# Patient Record
Sex: Female | Born: 1945 | Race: White | Hispanic: No | Smoking: Former smoker
Health system: Southern US, Community
[De-identification: ages and names within clinical notes are randomized; demographics above are authoritative.]

## PROBLEM LIST (undated history)

## (undated) DIAGNOSIS — M1990 Unspecified osteoarthritis, unspecified site: Secondary | ICD-10-CM

## (undated) DIAGNOSIS — Z973 Presence of spectacles and contact lenses: Secondary | ICD-10-CM

## (undated) DIAGNOSIS — G250 Essential tremor: Secondary | ICD-10-CM

## (undated) DIAGNOSIS — Z972 Presence of dental prosthetic device (complete) (partial): Secondary | ICD-10-CM

## (undated) DIAGNOSIS — E785 Hyperlipidemia, unspecified: Secondary | ICD-10-CM

## (undated) DIAGNOSIS — R112 Nausea with vomiting, unspecified: Secondary | ICD-10-CM

## (undated) DIAGNOSIS — Z9889 Other specified postprocedural states: Secondary | ICD-10-CM

## (undated) DIAGNOSIS — R7303 Prediabetes: Secondary | ICD-10-CM

## (undated) DIAGNOSIS — D071 Carcinoma in situ of vulva: Secondary | ICD-10-CM

## (undated) HISTORY — DX: Hyperlipidemia, unspecified: E78.5

## (undated) HISTORY — PX: KNEE ARTHROSCOPY: SUR90

## (undated) HISTORY — PX: TOTAL ABDOMINAL HYSTERECTOMY: SHX209

## (undated) HISTORY — PX: TOTAL HIP ARTHROPLASTY: SHX124

## (undated) HISTORY — PX: TOTAL KNEE ARTHROPLASTY: SHX125

---

## 1985-03-15 HISTORY — PX: OTHER SURGICAL HISTORY: SHX169

## 1999-02-02 ENCOUNTER — Encounter (INDEPENDENT_AMBULATORY_CARE_PROVIDER_SITE_OTHER): Payer: Self-pay

## 1999-02-02 ENCOUNTER — Encounter: Payer: Self-pay | Admitting: Orthopedic Surgery

## 1999-02-02 ENCOUNTER — Ambulatory Visit (HOSPITAL_COMMUNITY): Admission: RE | Admit: 1999-02-02 | Discharge: 1999-02-02 | Payer: Self-pay | Admitting: Orthopedic Surgery

## 1999-03-16 HISTORY — PX: WRIST GANGLION EXCISION: SUR520

## 2000-06-29 ENCOUNTER — Encounter: Admission: RE | Admit: 2000-06-29 | Discharge: 2000-06-29 | Payer: Self-pay | Admitting: Family Medicine

## 2000-06-29 ENCOUNTER — Encounter: Payer: Self-pay | Admitting: Family Medicine

## 2000-07-01 ENCOUNTER — Encounter: Admission: RE | Admit: 2000-07-01 | Discharge: 2000-07-01 | Payer: Self-pay | Admitting: Family Medicine

## 2000-07-01 ENCOUNTER — Encounter: Payer: Self-pay | Admitting: Family Medicine

## 2003-12-02 ENCOUNTER — Ambulatory Visit (HOSPITAL_COMMUNITY): Admission: RE | Admit: 2003-12-02 | Discharge: 2003-12-02 | Payer: Self-pay | Admitting: Orthopedic Surgery

## 2003-12-02 HISTORY — PX: PLANTAR FASCIA RELEASE: SHX2239

## 2005-01-19 ENCOUNTER — Ambulatory Visit: Payer: Self-pay | Admitting: Family Medicine

## 2005-02-08 ENCOUNTER — Ambulatory Visit: Payer: Self-pay | Admitting: Family Medicine

## 2005-04-05 ENCOUNTER — Ambulatory Visit: Payer: Self-pay | Admitting: Family Medicine

## 2005-06-14 ENCOUNTER — Ambulatory Visit: Payer: Self-pay | Admitting: Family Medicine

## 2005-06-21 ENCOUNTER — Ambulatory Visit: Payer: Self-pay | Admitting: Family Medicine

## 2005-07-19 ENCOUNTER — Ambulatory Visit: Payer: Self-pay | Admitting: Family Medicine

## 2005-07-27 ENCOUNTER — Ambulatory Visit: Payer: Self-pay | Admitting: Family Medicine

## 2005-09-13 ENCOUNTER — Ambulatory Visit: Payer: Self-pay | Admitting: Family Medicine

## 2005-10-11 ENCOUNTER — Ambulatory Visit: Payer: Self-pay | Admitting: Family Medicine

## 2005-12-21 DIAGNOSIS — IMO0002 Reserved for concepts with insufficient information to code with codable children: Secondary | ICD-10-CM | POA: Insufficient documentation

## 2005-12-21 DIAGNOSIS — E785 Hyperlipidemia, unspecified: Secondary | ICD-10-CM | POA: Insufficient documentation

## 2005-12-21 DIAGNOSIS — N951 Menopausal and female climacteric states: Secondary | ICD-10-CM | POA: Insufficient documentation

## 2005-12-21 DIAGNOSIS — E669 Obesity, unspecified: Secondary | ICD-10-CM | POA: Insufficient documentation

## 2005-12-21 DIAGNOSIS — H9319 Tinnitus, unspecified ear: Secondary | ICD-10-CM | POA: Insufficient documentation

## 2005-12-21 DIAGNOSIS — M1710 Unilateral primary osteoarthritis, unspecified knee: Secondary | ICD-10-CM

## 2005-12-21 DIAGNOSIS — M722 Plantar fascial fibromatosis: Secondary | ICD-10-CM | POA: Insufficient documentation

## 2005-12-21 DIAGNOSIS — N3941 Urge incontinence: Secondary | ICD-10-CM | POA: Insufficient documentation

## 2005-12-27 ENCOUNTER — Ambulatory Visit: Payer: Self-pay | Admitting: Family Medicine

## 2006-01-24 ENCOUNTER — Ambulatory Visit: Payer: Self-pay | Admitting: Family Medicine

## 2006-02-14 ENCOUNTER — Ambulatory Visit: Payer: Self-pay | Admitting: Family Medicine

## 2006-02-14 LAB — CONVERTED CEMR LAB: Blood Glucose, Fasting: 106 mg/dL

## 2006-02-15 LAB — CONVERTED CEMR LAB
Cholesterol: 162 mg/dL (ref 0–200)
HDL: 43 mg/dL (ref >39)
LDL Cholesterol: 75 mg/dL (ref 0–99)
Total CHOL/HDL Ratio: 3.8
Triglycerides: 220 mg/dL — ABNORMAL HIGH (ref <150)
VLDL: 44 mg/dL — ABNORMAL HIGH (ref 0–40)

## 2006-02-21 ENCOUNTER — Telehealth (INDEPENDENT_AMBULATORY_CARE_PROVIDER_SITE_OTHER): Payer: Self-pay | Admitting: *Deleted

## 2006-02-21 ENCOUNTER — Telehealth: Payer: Self-pay | Admitting: Family Medicine

## 2006-05-16 ENCOUNTER — Ambulatory Visit: Payer: Self-pay | Admitting: Family Medicine

## 2006-05-16 LAB — CONVERTED CEMR LAB: Blood Glucose, Fasting: 111 mg/dL

## 2006-05-17 LAB — CONVERTED CEMR LAB
BUN: 24 mg/dL — ABNORMAL HIGH (ref 6–23)
CO2: 24 meq/L (ref 19–32)
Calcium: 9.4 mg/dL (ref 8.4–10.5)
Chloride: 106 meq/L (ref 96–112)
Creatinine, Ser: 0.78 mg/dL (ref 0.40–1.20)
Glucose, Bld: 94 mg/dL (ref 70–99)
Potassium: 4.2 meq/L (ref 3.5–5.3)
Sodium: 143 meq/L (ref 135–145)

## 2006-06-30 ENCOUNTER — Encounter: Payer: Self-pay | Admitting: Family Medicine

## 2006-06-30 ENCOUNTER — Ambulatory Visit: Payer: Self-pay | Admitting: Family Medicine

## 2006-09-12 ENCOUNTER — Ambulatory Visit: Payer: Self-pay | Admitting: Family Medicine

## 2006-09-12 DIAGNOSIS — E119 Type 2 diabetes mellitus without complications: Secondary | ICD-10-CM

## 2006-09-12 DIAGNOSIS — E1169 Type 2 diabetes mellitus with other specified complication: Secondary | ICD-10-CM | POA: Insufficient documentation

## 2006-09-12 LAB — CONVERTED CEMR LAB
Blood Glucose, Fasting: 118 mg/dL
Hgb A1c MFr Bld: 5.8 %

## 2006-11-15 ENCOUNTER — Encounter: Payer: Self-pay | Admitting: Family Medicine

## 2006-11-15 LAB — CONVERTED CEMR LAB
BUN: 23 mg/dL (ref 6–23)
CO2: 19 meq/L (ref 19–32)
Calcium: 9.3 mg/dL (ref 8.4–10.5)
Chloride: 108 meq/L (ref 96–112)
Creatinine, Ser: 0.71 mg/dL (ref 0.40–1.20)
Glucose, Bld: 116 mg/dL — ABNORMAL HIGH (ref 70–99)
Potassium: 4.1 meq/L (ref 3.5–5.3)
Sodium: 142 meq/L (ref 135–145)

## 2006-11-16 ENCOUNTER — Telehealth (INDEPENDENT_AMBULATORY_CARE_PROVIDER_SITE_OTHER): Payer: Self-pay | Admitting: *Deleted

## 2006-11-24 ENCOUNTER — Encounter: Payer: Self-pay | Admitting: Family Medicine

## 2007-01-31 ENCOUNTER — Ambulatory Visit: Payer: Self-pay | Admitting: Family Medicine

## 2007-04-24 ENCOUNTER — Ambulatory Visit: Payer: Self-pay | Admitting: Family Medicine

## 2007-04-24 LAB — CONVERTED CEMR LAB
ALT: 16 units/L (ref 0–35)
AST: 18 units/L (ref 0–37)
Albumin: 4.6 g/dL (ref 3.5–5.2)
Alkaline Phosphatase: 79 units/L (ref 39–117)
BUN: 29 mg/dL — ABNORMAL HIGH (ref 6–23)
Blood Glucose, Fasting: 120 mg/dL
CO2: 25 meq/L (ref 19–32)
Calcium: 9.6 mg/dL (ref 8.4–10.5)
Chloride: 104 meq/L (ref 96–112)
Cholesterol: 187 mg/dL (ref 0–200)
Creatinine, Ser: 0.81 mg/dL (ref 0.40–1.20)
Glucose, Bld: 120 mg/dL — ABNORMAL HIGH (ref 70–99)
HDL: 48 mg/dL (ref >39)
Hgb A1c MFr Bld: 6 %
LDL Cholesterol: 108 mg/dL — ABNORMAL HIGH (ref 0–99)
Potassium: 4.6 meq/L (ref 3.5–5.3)
Sodium: 140 meq/L (ref 135–145)
TSH: 1.345 microintl units/mL (ref 0.350–5.50)
Total Bilirubin: 0.6 mg/dL (ref 0.3–1.2)
Total CHOL/HDL Ratio: 3.9
Total Protein: 7.6 g/dL (ref 6.0–8.3)
Triglycerides: 154 mg/dL — ABNORMAL HIGH (ref <150)
VLDL: 31 mg/dL (ref 0–40)

## 2007-04-25 ENCOUNTER — Encounter: Payer: Self-pay | Admitting: Family Medicine

## 2007-07-26 ENCOUNTER — Ambulatory Visit: Payer: Self-pay | Admitting: Family Medicine

## 2007-08-25 ENCOUNTER — Ambulatory Visit: Payer: Self-pay | Admitting: Family Medicine

## 2007-10-24 ENCOUNTER — Ambulatory Visit: Payer: Self-pay | Admitting: Family Medicine

## 2007-10-24 LAB — CONVERTED CEMR LAB
Blood Glucose, Fasting: 123 mg/dL
Cholesterol, target level: 200 mg/dL
HDL goal, serum: 40 mg/dL
Hgb A1c MFr Bld: 6.3 %
LDL Goal: 160 mg/dL

## 2007-10-25 LAB — CONVERTED CEMR LAB: Direct LDL: 112 mg/dL — ABNORMAL HIGH

## 2008-03-11 LAB — HM MAMMOGRAPHY

## 2008-03-18 ENCOUNTER — Encounter: Payer: Self-pay | Admitting: Family Medicine

## 2008-05-06 ENCOUNTER — Ambulatory Visit: Payer: Self-pay | Admitting: Family Medicine

## 2008-05-06 ENCOUNTER — Encounter: Payer: Self-pay | Admitting: Family Medicine

## 2008-05-06 LAB — CONVERTED CEMR LAB: Blood Glucose, Fasting: 122 mg/dL

## 2008-05-07 LAB — CONVERTED CEMR LAB
ALT: 15 units/L (ref 0–35)
AST: 14 units/L (ref 0–37)
Albumin: 4.6 g/dL (ref 3.5–5.2)
Alkaline Phosphatase: 77 units/L (ref 39–117)
BUN: 27 mg/dL — ABNORMAL HIGH (ref 6–23)
CO2: 24 meq/L (ref 19–32)
Calcium: 9.3 mg/dL (ref 8.4–10.5)
Chloride: 107 meq/L (ref 96–112)
Cholesterol: 177 mg/dL (ref 0–200)
Creatinine, Ser: 0.83 mg/dL (ref 0.40–1.20)
Glucose, Bld: 122 mg/dL — ABNORMAL HIGH (ref 70–99)
HDL: 44 mg/dL (ref >39)
LDL Cholesterol: 84 mg/dL (ref 0–99)
Potassium: 4.2 meq/L (ref 3.5–5.3)
Sodium: 143 meq/L (ref 135–145)
Total Bilirubin: 0.5 mg/dL (ref 0.3–1.2)
Total CHOL/HDL Ratio: 4
Total Protein: 7.4 g/dL (ref 6.0–8.3)
Triglycerides: 245 mg/dL — ABNORMAL HIGH (ref <150)
VLDL: 49 mg/dL — ABNORMAL HIGH (ref 0–40)

## 2008-07-23 ENCOUNTER — Ambulatory Visit: Payer: Self-pay | Admitting: Family Medicine

## 2008-07-23 DIAGNOSIS — L723 Sebaceous cyst: Secondary | ICD-10-CM | POA: Insufficient documentation

## 2008-07-24 ENCOUNTER — Encounter: Payer: Self-pay | Admitting: Family Medicine

## 2008-09-02 ENCOUNTER — Ambulatory Visit: Payer: Self-pay | Admitting: Family Medicine

## 2008-09-02 LAB — CONVERTED CEMR LAB: Blood Glucose, Fasting: 118 mg/dL

## 2008-09-03 ENCOUNTER — Telehealth: Payer: Self-pay | Admitting: Family Medicine

## 2008-09-03 LAB — CONVERTED CEMR LAB: Triglycerides: 236 mg/dL — ABNORMAL HIGH (ref <150)

## 2008-12-23 ENCOUNTER — Encounter: Payer: Self-pay | Admitting: Family Medicine

## 2009-04-03 ENCOUNTER — Ambulatory Visit: Payer: Self-pay | Admitting: Family Medicine

## 2009-04-03 LAB — CONVERTED CEMR LAB
Blood Glucose, AC Bkfst: 125 mg/dL
Hgb A1c MFr Bld: 6.2 %

## 2009-04-04 LAB — CONVERTED CEMR LAB
ALT: 18 units/L (ref 0–35)
AST: 17 units/L (ref 0–37)
Albumin: 4.9 g/dL (ref 3.5–5.2)
Alkaline Phosphatase: 81 units/L (ref 39–117)
BUN: 25 mg/dL — ABNORMAL HIGH (ref 6–23)
CO2: 24 meq/L (ref 19–32)
Calcium: 10.1 mg/dL (ref 8.4–10.5)
Chloride: 103 meq/L (ref 96–112)
Cholesterol: 149 mg/dL (ref 0–200)
Creatinine, Ser: 0.67 mg/dL (ref 0.40–1.20)
Glucose, Bld: 111 mg/dL — ABNORMAL HIGH (ref 70–99)
HDL: 46 mg/dL (ref >39)
LDL Cholesterol: 76 mg/dL (ref 0–99)
Potassium: 4.3 meq/L (ref 3.5–5.3)
Sodium: 143 meq/L (ref 135–145)
Total Bilirubin: 0.6 mg/dL (ref 0.3–1.2)
Total CHOL/HDL Ratio: 3.2
Total Protein: 7.7 g/dL (ref 6.0–8.3)
Triglycerides: 135 mg/dL (ref <150)
VLDL: 27 mg/dL (ref 0–40)

## 2009-04-15 ENCOUNTER — Encounter: Payer: Self-pay | Admitting: Family Medicine

## 2009-06-06 ENCOUNTER — Telehealth: Payer: Self-pay | Admitting: Family Medicine

## 2009-06-06 DIAGNOSIS — M545 Low back pain, unspecified: Secondary | ICD-10-CM | POA: Insufficient documentation

## 2009-06-09 ENCOUNTER — Encounter (INDEPENDENT_AMBULATORY_CARE_PROVIDER_SITE_OTHER): Payer: Self-pay | Admitting: *Deleted

## 2009-09-23 ENCOUNTER — Encounter: Payer: Self-pay | Admitting: Family Medicine

## 2009-09-30 ENCOUNTER — Encounter: Payer: Self-pay | Admitting: Family Medicine

## 2009-12-22 ENCOUNTER — Encounter (INDEPENDENT_AMBULATORY_CARE_PROVIDER_SITE_OTHER): Payer: Self-pay | Admitting: *Deleted

## 2010-04-16 NOTE — Progress Notes (Signed)
  Phone Note Call from Patient Call back at Home Phone [phone]   Caller: Patient Call For: Seymour Bars DO Summary of Call: Pt calles and states that she could not afford the lovaza it was going to be 167.00. Instructed pt she could take 14 fish oil capsules OTC daily ionstead and pt states she would do that is already taking 4 a day so add 10 more. Initial call taken by: Kathlene November,  September 03, 2008 1:57 PM

## 2010-04-16 NOTE — Letter (Signed)
Summary: Primary Care Consult Scheduled Letter  [place] [facility]  [address]   [place], [place] [address]   Phone: [phone]  Fax: [phone]      06/09/2009 MRN: [id number]  [facility] [address] [place], [place]  [address]    Dear Paige Moore,      We have scheduled an appointment for you.  At the recommendation of Dr.BOWEN, we have scheduled you a consult with [facility],  DR  Almyra Free  on APRIL 21 ,2011 at 8:15AM .  Their address is [facility] ,[facility] [address] [place]  [address] . The office phone number is _[phone].  If this appointment day and time is not convenient for you, please feel free to call the office of the doctor you are being referred to at the number listed above and reschedule the appointment.     It is important for you to keep your scheduled appointments. We are here to make sure you are given good patient care. If you have questions or you have made changes to your appointment, please notify us at  [phone], ask for HELEN.    Thank you, HELEN Patient Care Coordinator [place]

## 2010-04-16 NOTE — Miscellaneous (Signed)
Summary: mammogram  Clinical Lists Changes  Observations: Added new observation of MAMMO DUE: 03/2009 (03/18/2008 12:52) Added new observation of MAMMRECACT: Screening mammogram in 1 year.    (03/11/2008 12:53) Added new observation of MAMMOGRAM: No specific mammographic evidence of malignancy.  No significant changes compared to previous study.  Assessment: BIRADS 2. Location: [facility].    (03/11/2008 12:53)      Mammogram  Procedure date:  03/11/2008  Findings:      No specific mammographic evidence of malignancy.  No significant changes compared to previous study.  Assessment: BIRADS 2. Location: [facility].     Comments:      Screening mammogram in 1 year.     Procedures Next Due Date:    Mammogram: 03/2009   Mammogram  Procedure date:  03/11/2008  Findings:      No specific mammographic evidence of malignancy.  No significant changes compared to previous study.  Assessment: BIRADS 2. Location: [facility].     Comments:      Screening mammogram in 1 year.     Procedures Next Due Date:    Mammogram: 03/2009

## 2010-04-16 NOTE — Progress Notes (Signed)
  Phone Note Call from Patient Reason for Call: Talk to Doctor Summary of Call: WAS GIVEN SAMPLES OF DETROL 4MG , WORKING BUT NOT AS WELL AS SHE WOULD LIKE  SHE WOULD LIKE A RX FOR DETROL BUT A STRONGER DOSE....PLEASE CALL Initial call taken by: Gardiner Ramus,  February 21, 2006 10:34 AM    Follow-up for Phone Call Details for Follow-up Action Taken: Called pt and she agrees to trying generic Ditropan 5 mg by mouth two times a day.  Will call her Rx into [facility]. Follow-up by: Seymour Bars D.O.,  February 21, 2006 11:54 AM  Additional Follow-up for Phone Call Details for Additional Follow-up Action Taken: ditropan 5mg  generic one by mouth two times a day #60 no refills called to [facility] per MD Additional Follow-up Action Taken: Phone Call Completed, Rx Called In Additional Follow-up by: Harlene Salts,  February 21, 2006 1:01 PM

## 2010-04-16 NOTE — Assessment & Plan Note (Signed)
Summary: Fu glucose and TG   Vital Signs:  Patient profile:   65 year old female Height:      64 inches Weight:      207 pounds Pulse rate:   79 / minute BP sitting:   111 / 67  (left arm) Cuff size:   large  Vitals Entered By: Kathlene November (September 02, 2008 8:44 AM) CC: followup BS. Pt home monitor this morning was 105   Primary Care Provider:  Seymour Bars D.O.  CC:  followup BS. Pt home monitor this morning was 105.  History of Present Illness: Has lost 4 pounds. Says fasting sugars have been 105-125.   Nothing over 130. No sxs of urinary frequency.  Has really been able to exercise. She was not able to make it to the nutrition classes as her insurance wouldn't cover them.    Saw ortho for knee pain.  Given injections into both knees. Hasn't been able to walk much as for exercise because of her knee pain. Seeing Dr. Leslee Home.   She has been taking her fish oil 4 tabs daily and has lost 4 pounds.   Current Medications (verified): 1)  Pravastatin Sodium 80 Mg  Tabs (Pravastatin Sodium) .Marland Kitchen.. 1 Tab By Mouth Qhs 2)  Tylenol Extra Strength 500 Mg Tabs (Acetaminophen) .Marland Kitchen.. 1-2 Tabs By Mouth Three Times A Day Prn 3)  Accu-Check Aviva .... Use As Directed 4)  Accu-Check Aviva Test Strips .... Use Daily As Directed 5)  Meloxicam  Allergies (verified): No Known Drug Allergies  Comments:  Nurse/Medical Assistant: The patient's medications and allergies were reviewed with the patient and were updated in the Medication and Allergy Lists. Kathlene November (September 02, 2008 8:46 AM)  Physical Exam  General:  Well-developed,well-nourished,in no acute distress; alert,appropriate and cooperative throughout examination Lungs:  Normal respiratory effort, chest expands symmetrically. Lungs are clear to auscultation, no crackles or wheezes. Heart:  Normal rate and regular rhythm. S1 and S2 normal without gallop, murmur, click, rub or other extra sounds. Extremities:  No LE edema.  Skin:  no rashes.    Psych:  Cognition and judgment appear intact. Alert and cooperative with normal attention span and concentration. No apparent delusions, illusions, hallucinations   Impression & Recommendations:  Problem # 1:  IMPAIRED FASTING GLUCOSE (ICD-790.21) Since unable to make the nutrition classes when over some dietary infomration basic about DM.   Alos recommend continue to  check her sugars once a week fasting until her FU in Nobvember. Per Dr. Senaida Lange records I requested that she schedule a CPE.  Please call is sugars start trending > 130. Tried to start back some exercise routine even if not walking because of her knee pain. Congratulated her on her weight loss!   Orders: Fingerstick ([address]) Capillary Blood Glucose/CBG ([address])  Problem # 2:  HYPERLIPIDEMIA (ICD-272.4) Due to recheck level. Hopefully the weight loss and fish oil are helping.  Her updated medication list for this problem includes:    Pravastatin Sodium 80 Mg Tabs (Pravastatin sodium) .Marland Kitchen... 1 tab by mouth qhs  Orders: T-Triglycerides ([address]11-91478)  Complete Medication List: 1)  Pravastatin Sodium 80 Mg Tabs (Pravastatin sodium) .Marland Kitchen.. 1 tab by mouth qhs 2)  Tylenol Extra Strength 500 Mg Tabs (Acetaminophen) .Marland Kitchen.. 1-2 tabs by mouth three times a day prn 3)  Accu-check Aviva  .... Use as directed 4)  Accu-check Aviva Test Strips  .... Use daily as directed 5)  Diclofenac Sodium 75 Mg Tbec (Diclofenac sodium) .... Take 1  tablet by mouth two times a day as needed 6)  Fish Oil 1000 Mg Caps (Omega-3 fatty acids) .... 4 tabs a day by mouth Prescriptions: PRAVASTATIN SODIUM 80 MG  TABS (PRAVASTATIN SODIUM) 1 tab by mouth qhs  #90 x 3   Entered and Authorized by:   Nani Gasser MD   Signed by:   Nani Gasser MD on 09/02/2008   Method used:   Electronically to        [facility] [phone]* (retail)       [address] [place], [place]  [address]       Ph: [phone]       Fax: [phone]   RxID:    [id number] DICLOFENAC SODIUM 75 MG TBEC (DICLOFENAC SODIUM) Take 1 tablet by mouth two times a day as needed  #180 x 0   Entered and Authorized by:   Nani Gasser MD   Signed by:   Nani Gasser MD on 09/02/2008   Method used:   Electronically to        [facility] [phone]* (retail)       [address] [place], [place]  [address]       Ph: [phone]       Fax: [phone]   RxID:   [id number]   Laboratory Results   Blood Tests   Date/Time Received: 09/02/2008 Date/Time Reported: 09/02/2008  Glucose (fasting): 118 mg/dL   (Normal Range: 30-160)

## 2010-04-16 NOTE — Miscellaneous (Signed)
Summary: increased pravachol dose  Clinical Lists Changes  Medications: Changed medication from PRAVASTATIN SODIUM 40 MG TABS (PRAVASTATIN SODIUM) 1 tab by mouth qhs to PRAVASTATIN SODIUM 80 MG  TABS (PRAVASTATIN SODIUM) 1 tab by mouth qhs - Signed Rx of PRAVASTATIN SODIUM 80 MG  TABS (PRAVASTATIN SODIUM) 1 tab by mouth qhs;  #90 x 0;  Signed;  Entered by: Seymour Bars DO;  Authorized by: Seymour Bars DO;  Method used: Print then Give to Patient    Prescriptions: PRAVASTATIN SODIUM 80 MG  TABS (PRAVASTATIN SODIUM) 1 tab by mouth qhs  #90 x 0   Entered and Authorized by:   Seymour Bars DO   Signed by:   Seymour Bars DO on 04/25/2007   Method used:   Print then Give to Patient   RxID:   [id number]

## 2010-04-16 NOTE — Assessment & Plan Note (Signed)
Summary: osteoarthitis/ OAB/ insulin resistance   Vital Signs:  Patient Profile:   65 Years Old Female Height:     64 inches Weight:      201 pounds Pulse rate:   76 / minute BP sitting:   130 / 82  (left arm) Cuff size:   large  Vitals Entered By: Harlene Salts (May 16, 2006 10:43 AM)              Prescriptions: PRAVASTATIN SODIUM 40 MG TABS (PRAVASTATIN SODIUM) 1 tab by mouth qhs  #90 x 3   Entered and Authorized by:   Seymour Bars D.O.   Signed by:   Seymour Bars D.O. on 05/16/2006   Method used:   Print then Give to Patient   RxID:   [id number] DICLOFENAC SODIUM 75 MG TBEC (DICLOFENAC SODIUM) 1 tab by mouth two times a day prn  #180 x 1   Entered and Authorized by:   Seymour Bars D.O.   Signed by:   Seymour Bars D.O. on 05/16/2006   Method used:   Print then Give to Patient   RxID:   [id number]    Visit Type:  f/u PCP:  Wynell Balloon  Chief Complaint:  f/u arthritis and OAB.  History of Present Illness: 65 yo WF with insulin resistance syndrome presents for f/u.  She has knee osteoarthritis  that has limited her ability to exercise much.  Is stable and claims to be doing welll, alternating Tyenol Arthritis and Diclofenac, which also helps with her plantar fasciitis.  Has held off on going back to Dr Leslee Home for Synvisc injections or pursuing further treatment.  Plans to walk more this Spring.  Has lost 20 lbs in 6 months by cutting back on portion sizes.    For OAB, she voids every hr during the day and 3-4 x at night.  Has cut back on caffeine in the afternoons.  Tried Detrol LA and Ditropan which did not help.  She is s/p hysterectomy for heavy bleeding at age 71, and has had 2 term pregnancies.  Denies trouble with complete emptying, dysuria or hematuria.  Denies loss of urine with coughing or sneezing.  Wants to hold off on seeing a urologist.    For high cholestrol, she is on Pravastatin and working on diet and exercise, with high TGs on recent FLP.  Had LFTs  in June 2007.  Also takes daily fish oil supplement.  Prior Medications: DICLOFENAC SODIUM 75 MG TBEC (DICLOFENAC SODIUM) 1 tab by mouth two times a day prn PRAVASTATIN SODIUM 40 MG TABS (PRAVASTATIN SODIUM) 1 tab by mouth qhs TYLENOL EXTRA STRENGTH 500 MG TABS (ACETAMINOPHEN) 1-2 tabs by mouth three times a day prn Current Allergies: No known allergies   Past Medical History:     last knee injection 11-06 bilat     Review of Systems  General      Denies chills and fever.  CV      Denies chest pain or discomfort, leg cramps with exertion, and shortness of breath with exertion.  GU      Complains of incontinence.      Denies abnormal vaginal bleeding, discharge, dysuria, hematuria, nocturia, urinary frequency, and urinary hesitancy.      urge incontinence   Physical Exam  General:     alert, well-developed, well-nourished, and well-hydrated.  obese Head:     normocephalic and atraumatic.   Eyes:     wears glasses Nose:     no nasal  discharge.   Mouth:     upper dentures, fair dentition- lower Neck:     no masses.   Lungs:     Normal respiratory effort, chest expands symmetrically. Lungs are clear to auscultation, no crackles or wheezes. Heart:     Normal rate and regular rhythm. S1 and S2 normal without gallop, murmur, click, rub or other extra sounds. Abdomen:     soft, non-tender, normal bowel sounds, no distention, no hepatomegaly, and no splenomegaly.   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  Continue NSAIDs, alternating with Tylenol to decrease NSAID use.  Update BMP for renal function on chronic NSAIDs.  Cautioned her about GI bleeding, renal dyfunction, and recommended going back to ortho (Dr [place]) to discuss Synvisc, vs other treatment to limit NSAID use and get her more active.   Her  updated medication list for this problem includes:    Diclofenac Sodium 75 Mg Tbec (Diclofenac sodium) .Marland Kitchen... 1 tab by mouth two times a day prn    Tylenol Extra Strength 500 Mg Tabs (Acetaminophen) .Marland Kitchen... 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  Understands that she needs to work on weight loss, healthy diet and exercise.  Recommend nutrition referall at next visit.  See back in 3 mos for A1C, discuss adding Metformin. Orders: Capillary Blood Glucose (82948) Fingerstick (56213)   Problem # 3:  HYPERLIPIDEMIA (ICD-272.4) Refilled Pravastain, LFTs due in June.  Continue Omega 3 fish oil for high TGs and low HDL.  Recheck FLP in June. Her updated medication list for this problem includes:    Pravastatin Sodium 40 Mg Tabs (Pravastatin sodium) .Marland Kitchen... 1 tab by mouth qhs   Other Orders: T-Basic Metabolic Panel [id number])   Patient Instructions: 1)  Have labs drawn- do not need to be fasting. 2)  Work on diet, exercise and weight loss for pre-diabetic state. 3)  Alternated Dicofenac with Tylenol every other day. 4)  Call when you want to see urology for overactive bladder. 5)  Return in 3 months to check Hemoglobin A1C, weight managment.  Laboratory Results   Urine Tests       Date/Time Recieved:  May 16, 2006 10:48 AM  Date/Time Reported:  May 16, 2006 10:48 AM   Blood Tests   Glucose (fasting): 111 mg/dL   (Normal Range: 29-528)   Other Tests

## 2010-04-16 NOTE — Assessment & Plan Note (Signed)
Summary: boil   Vital Signs:  Patient Profile:   65 Years Old Female Height:     64 inches Weight:      209 pounds O2 Sat:      96 % Temp:     98.3 degrees F oral Pulse rate:   86 / minute BP sitting:   139 / 84  (left arm) Cuff size:   large  Vitals Entered By: Harlene Salts (August 25, 2007 8:12 AM)                 Visit Type:  acute PCP:  Seymour Bars D.O.  Chief Complaint:  boil on lower left buttocks.  History of Present Illness: 65 yo WF presents for 1 wk of L buttock boil.  Tender.  Hurts to sit on hard chair at work.  Denies fevers or chills.  Denies hx of boils or exposure to MRSA.  Has not used anything for treatment.  Denies drainage.      Current Allergies: No known allergies    Social History:    Reviewed history from 04/24/2007 and no changes required:       Widowed 10-08.[facility]  Has 2 grown children, 4 grandchildren.  Quit smoking 5 yrs ago, 15 pk yrs.  Not much exercise.  Works at [employer].    Review of Systems      See HPI   Physical Exam  General:     alert, well-developed, well-nourished, and well-hydrated.  obese Eyes:     conjunctiva clear Mouth:     pharynx pink and moist.   Neck:     no masses.   Lungs:     Normal respiratory effort, chest expands symmetrically. Lungs are clear to auscultation, no crackles or wheezes. Heart:     RRR, no murmurs Skin:     5  x 2.5 cm boil on L buttocks near natal cleft, slightly indurated.  tender.  too soft for I&D.   Inguinal Nodes:     No significant adenopathy    Impression & Recommendations:  Problem # 1:  CARBUNCLE AND FURUNCLE OF BUTTOCK (ICD-680.5) Assessment: New Not ready for I&D.  Treat with Bactrim DS x 7 days, warm soaks and antibacterial soap daily to keep clean.  Out of work x 2 days since she sits on a hard chair.  Call if turning red and harder for I&D.  Call if any fevers, chills or purulent drainage develops.    Complete Medication List: 1)  Diclofenac Sodium 75 Mg Tbec  (Diclofenac sodium) .Marland Kitchen.. 1 tab by mouth two times a day prn 2)  Pravastatin Sodium 80 Mg Tabs (Pravastatin sodium) .Marland Kitchen.. 1 tab by mouth qhs 3)  Tylenol Extra Strength 500 Mg Tabs (Acetaminophen) .Marland Kitchen.. 1-2 tabs by mouth three times a day prn 4)  Promethazine Hcl 25 Mg Tabs (Promethazine hcl) .... 1/2 to 1 tab by mouth q 6 hrs as needed nausea 5)  Accu-check Aviva  .... Use as directed 6)  Accu-check Aviva Test Strips  .... Use daily as directed 7)  Bactrim Ds 800-160 Mg Tabs (Sulfamethoxazole-trimethoprim) .Marland Kitchen.. 1 tab by mouth two times a day x 7 days    Prescriptions: BACTRIM DS 800-160 MG  TABS (SULFAMETHOXAZOLE-TRIMETHOPRIM) 1 tab by mouth two times a day x 7 days  #14 x 0   Entered and Authorized by:   Seymour Bars DO   Signed by:   Seymour Bars DO on 08/25/2007   Method used:   Electronically sent to [facility]  [address]       [address] [place], [place]  [address]       Ph: [phone]       Fax: [phone]   RxID:   [id number]  ]

## 2010-04-16 NOTE — Letter (Signed)
Summary: Out of Work  [facility]  [address]   [place], [place] [address]   Phone: [phone]  Fax: [phone]    Jul 26, 2007   Employee:  Paige Moore    To Whom It May Concern:   For Medical reasons, please excuse the above named employee from work for the following dates:  Start:   May 13-14th  End:   May 15th  If you need additional information, please feel free to contact our office.         Sincerely,    Seymour Bars DO

## 2010-04-16 NOTE — Assessment & Plan Note (Signed)
Summary: GLUCOSE CK and FU cholesterol   Vital Signs:  Patient Profile:   65 Years Old Female Height:     64 inches Weight:      206 pounds Pulse rate:   85 / minute BP sitting:   130 / 86  (left arm) Cuff size:   large  Vitals Entered By: Harlene Salts (October 24, 2007 10:47 AM)                 PCP:  Seymour Bars D.O.  Chief Complaint:  follow-up impaired glucose.  History of Present Illness: Has ben walking for exercise.  Has been using sugar substitutes.  Minimal starches. Lost 3 pounds since OV in JUne.  Here to fu insulin resistance. Last CBG was 120 in February. At that time her statin was doubled as well. She is tolerating this change without any side effects, including myalgias.   Lipid Management History:      Positive NCEP/ATP III risk factors include female age 58 years old or older and diabetes.  Negative NCEP/ATP III risk factors include no history of early menopause without estrogen hormone replacement, no family history for ischemic heart disease, non-tobacco-user status, non-hypertensive, no ASHD (atherosclerotic heart disease), no prior stroke/TIA, no peripheral vascular disease, and no history of aortic aneurysm.       Current Allergies: No known allergies       Physical Exam  General:     Well-developed,well-nourished,in no acute distress; alert,appropriate and cooperative throughout examination    Impression & Recommendations:  Problem # 1:  IMPAIRED FASTING GLUCOSE (ICD-790.21) Still imparied.  REcheck in 6 months. Continue to work on weight loss, this is key for controlling her sugars.  Orders: Capillary Blood Glucose (82948) Fingerstick (36416) Hemoglobin A1C (83036)  Labs Reviewed: HgBA1c: 6.3 (10/24/2007)   Creat: 0.81 (04/24/2007)      Problem # 2:  HYPERLIPIDEMIA (ICD-272.4) Due to recheck LDL since on higher dose of statin. Her goals are based on using a Dx of DM, even though she is technically insulin resitance so minimize her  risk it reduces her LDL goal to less than 100.  Her updated medication list for this problem includes:    Pravastatin Sodium 80 Mg Tabs (Pravastatin sodium) .Marland Kitchen... 1 tab by mouth qhs  Orders: T- * Misc. Laboratory test [phone])   Complete Medication List: 1)  Diclofenac Sodium 75 Mg Tbec (Diclofenac sodium) .Marland Kitchen.. 1 tab by mouth two times a day prn 2)  Pravastatin Sodium 80 Mg Tabs (Pravastatin sodium) .Marland Kitchen.. 1 tab by mouth qhs 3)  Tylenol Extra Strength 500 Mg Tabs (Acetaminophen) .Marland Kitchen.. 1-2 tabs by mouth three times a day prn 4)  Promethazine Hcl 25 Mg Tabs (Promethazine hcl) .... 1/2 to 1 tab by mouth q 6 hrs as needed nausea 5)  Accu-check Aviva  .... Use as directed 6)  Accu-check Aviva Test Strips  .... Use daily as directed  Lipid Assessment/Plan:      The patient's calculated 10 year coronary heart disease risk is 17 %.  Based on NCEP/ATP III, the patient's risk factor category is "history of diabetes".  From this information, the patient's calculated lipid goals are as follows: Total cholesterol goal is 200; LDL cholesterol goal is 100; HDL cholesterol goal is 40; Triglyceride goal is 150.  Her LDL cholesterol goal has not been met.     Patient Instructions: 1)  Please schedule a follow-up appointment in 6 months.   Prescriptions: PRAVASTATIN SODIUM 80 MG  TABS (PRAVASTATIN SODIUM)  1 tab by mouth qhs  #90 x 2   Entered and Authorized by:   Nani Gasser MD   Signed by:   Nani Gasser MD on 10/24/2007   Method used:   Electronically sent to ...       [address]*       [address] [place], [place]  [address]       Ph: [phone]       Fax: [phone]   RxID:   [id number] DICLOFENAC SODIUM 75 MG TBEC (DICLOFENAC SODIUM) 1 tab by mouth two times a day prn  #180 x 2   Entered and Authorized by:   Nani Gasser MD   Signed by:   Nani Gasser MD on 10/24/2007   Method used:   Electronically sent to ...       [facility] [address]*        [address] [place], [place]  [address]       Ph: [phone]       Fax: [phone]   RxID:   [id number]  ] Laboratory Results   Blood Tests   Date/Time Recieved: October 24, 2007 10:47 AM  Date/Time Reported: October 24, 2007 10:52 AM   Glucose (fasting): 123 mg/dL   (Normal Range: 88-416) HGBA1C: 6.3%   (Normal Range: Non-Diabetic - 3-6%   Control Diabetic - 6-8%)

## 2010-04-16 NOTE — Letter (Signed)
Summary: Out of Work  [facility]  [name]   [place], [place] [address]   Phone: [phone]  Fax: [phone]    August 25, 2007   Employee:  Paige Moore    To Whom It May Concern:   For Medical reasons, please excuse the above named employee from work for the following dates:  Start:   June 12th- 13th  End:   June 14th  If you need additional information, please feel free to contact our office.         Sincerely,    Seymour Bars DO

## 2010-04-16 NOTE — Assessment & Plan Note (Signed)
Summary: FLU SHOT  Nurse Visit   Vitals Entered By: Harlene Salts (January 31, 2007 9:21 AM)                 Prior Medications: DICLOFENAC SODIUM 75 MG TBEC (DICLOFENAC SODIUM) 1 tab by mouth two times a day prn PRAVASTATIN SODIUM 40 MG TABS (PRAVASTATIN SODIUM) 1 tab by mouth qhs TYLENOL EXTRA STRENGTH 500 MG TABS (ACETAMINOPHEN) 1-2 tabs by mouth three times a day prn Current Allergies: No known allergies    Influenza Vaccine    Vaccine Type: Fluvax 3+    Site: left deltoid    Mfr: NOVARTIS    Dose: 0.5 ml    Route: IM    Given by: Harlene Salts    Exp. Date: 09/12/2007    Lot #: [id number]    VIS given: 10/06/06  Flu Vaccine Consent Questions    Do you have a history of severe allergic reactions to this vaccine? no    Any prior history of allergic reactions to egg and/or gelatin? no    Do you have a sensitivity to the preservative Thimersol? no    Do you have a past history of Guillan-Barre Syndrome? no    Do you currently have an acute febrile illness? no    Have you ever had a severe reaction to latex? no    Vaccine information given and explained to patient? yes    Are you currently pregnant? no   Orders Added: 1)  Flu Vaccine 62yrs + [90658] 2)  Admin 1st Vaccine [email or web address]    ]

## 2010-04-16 NOTE — Progress Notes (Signed)
----   Converted from flag ---- ---- 02/21/2006 11:55 AM, Seymour Bars D.O. wrote: Can you call in Rx to [facility] for Oxybutynin 5 mg by mouth two times a day #60 with 2 refills.  Thanks. ------------------------------ Rx for oxybutinin 5mg  called to [facility]

## 2010-04-16 NOTE — Assessment & Plan Note (Signed)
Summary: f/u impaired fasting/ lipids   Vital Signs:  Patient Profile:   65 Years Old Female Height:     64 inches Weight:      208 pounds BMI:     35.83 O2 Sat:      94 % Pulse rate:   80 / minute BP sitting:   114 / 66  (left arm) Cuff size:   large  Vitals Entered By: Harlene Salts (April 24, 2007 9:38 AM)                 Visit Type:  f/u PCP:  Seymour Bars D.O.  Chief Complaint:  follow-up impaired fasting glucose.  History of Present Illness: 65 yo WF comes back for f/u impaired fasting glucose.  She informed me that her husband passed away in 12/30/2022 from heart dz.  She is still greiving but handling it well.  Has family closeby and has a lot of support.  Since this time, she has gained some weight and has not been eating very healthy.  She is back to work at [employer].  She is not exercising and still has knee pain.  Wants RX for diclofenac which helps.    Current Allergies: No known allergies   Past Medical History:    Reviewed history from 05/16/2006 and no changes required:        last knee injection 11-06 bilat  Past Surgical History:    Reviewed history from 01/24/2006 and no changes required:       ankle surgery- R after fx        L knee arthroscopy        LTCSx 2        TAH       xray- mild to mod L> R knee arthritis       xray- mod arthritis R>L hips   Social History:    Widowed 10-08.Marland Kitchen  Has 2 grown children, 4 grandchildren.  Quit smoking 5 yrs ago, 15 pk yrs.  Not much exercise.  Works at [employer].    Review of Systems      See HPI   Physical Exam  General:     alert, well-developed, well-nourished, and well-hydrated.  obese Head:     normocephalic and atraumatic.   Mouth:     pharynx pink and moist.   Neck:     supple and no masses.   Lungs:     normal respiratory effort, no intercostal retractions, no accessory muscle use, and normal breath sounds.   Heart:     normal rate, regular rhythm, and no murmur.     Extremities:     no E/C/C Skin:     color normal and no rashes.   Cervical Nodes:     No lymphadenopathy noted Psych:     good eye contact, not anxious appearing, and not depressed appearing.      Impression & Recommendations:  Problem # 1:  IMPAIRED FASTING GLUCOSE (ICD-790.21) Fasting glucose 120 and A1C 6.0.  Getting very close to the dx of DM type II.  Discused what this means to her today and printed out reading material about type II DM from [email or web address].  Advised her to look online for info on diabetic diet.  She declined referral to nutritionist at this time.  Will start checking sugars 3 x a wk, 2 AM fastings and 1 2 hr after dinner.  Recheck 3 mos and if A1C goes up, start metformin.  She agrees  to working on diet and exercise.  She has a meter at home but will call for nurse visit if she has any trouble using it. 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 Her updated medication list for this problem includes:    Diclofenac Sodium 75 Mg Tbec (Diclofenac sodium) .Marland Kitchen... 1 tab by mouth two times a day prn    Tylenol Extra Strength 500 Mg Tabs (Acetaminophen) .Marland Kitchen... 1-2 tabs by mouth three times a day prn   Problem # 3:  HYPERLIPIDEMIA (ICD-272.4) Continue statin.  Recheck labs. Her updated medication list for this problem includes:    Pravastatin Sodium 40 Mg Tabs (Pravastatin sodium) .Marland Kitchen... 1 tab by mouth qhs  Orders: T-Comprehensive Metabolic Panel ([id number]) T-Lipid Profile ([id number])   Complete Medication List: 1)  Diclofenac Sodium 75 Mg Tbec (Diclofenac sodium) .Marland Kitchen.. 1 tab by mouth two times a day prn 2)  Pravastatin Sodium 40 Mg Tabs (Pravastatin sodium) .Marland Kitchen.. 1 tab by mouth qhs 3)  Tylenol Extra Strength 500 Mg Tabs (Acetaminophen) .Marland Kitchen.. 1-2 tabs by mouth three times a day prn   Patient Instructions: 1)  Start checking sugars 3 x a wk: 2)  2  AM fastings with a goal <110. 3)  1 2 hrs after dinner with a goal <150. 4)  Work on diabetic diet, 30 min of exercise most days a wk. 5)  Labs today. 6)  I will mail your results. 7)  F/U for repeat A1C and review of glucose readings in 3-4 mos.    Prescriptions: PRAVASTATIN SODIUM 40 MG TABS (PRAVASTATIN SODIUM) 1 tab by mouth qhs  #90 x 2   Entered and Authorized by:   Seymour Bars DO   Signed by:   Seymour Bars DO on 04/24/2007   Method used:   Electronically sent to ...       [facility]  [address]*       [address] [place], [place]  [address]       Ph: [phone]       Fax: [phone]   RxID:   [id number] DICLOFENAC SODIUM 75 MG TBEC (DICLOFENAC SODIUM) 1 tab by mouth two times a day prn  #180 x 2   Entered and Authorized by:   Seymour Bars DO   Signed by:   Seymour Bars DO on 04/24/2007   Method used:   Electronically sent to ...       [facility]  [address]       [address] [place], [place]  [address]       Ph: [phone]       Fax: [phone]   RxID:   [id number]  ] Laboratory Results   Blood Tests   Date/Time Recieved: April 24, 2007 9:41 AM  Date/Time Reported: April 24, 2007 9:42 AM   Glucose (fasting): 120 mg/dL   (Normal Range: 27-062) HGBA1C: 6.0%   (Normal Range: Non-Diabetic - 3-6%   Control Diabetic - 6-8%)

## 2010-04-16 NOTE — Assessment & Plan Note (Signed)
Summary: IFG f/u   Vital Signs:  Patient Profile:   65 Years Old Female Height:     64 inches Weight:      211 pounds BMI:     36.35 Pulse rate:   84 / minute BP sitting:   127 / 84  (left arm) Cuff size:   large  Vitals Entered By: Harlene Salts (May 06, 2008 8:36 AM)                 PCP:  Seymour Bars D.O.  Chief Complaint:  follow-up fasting glucose.  History of Present Illness: 65 yo WF presents for f/u IFG.  Her fasting glucose was 123 in Aug and 122 today.  She is using Splenda and walking 20 min 5 x a wk.  She is upset that she has not lost more weight.  Does  have a meter at home but has not started checking her sugars.    She would like to see the nutritionist if her insurance covers it.    She continue regular use of Diclofenac for arthritis pain.  This and tylenol are helping.  She is on Pravastin for cholesterol.    Current Allergies: No known allergies   Past Medical History:    knee DJD, Hip DJD    IFG    obesity    high cholesterol  Past Surgical History:    ankle surgery- R after fx     L knee arthroscopy     LTCSx 2     TAH   Social History:    Reviewed history from 04/24/2007 and no changes required:       Widowed 10-08.Marland Kitchen  Has 2 grown children, 4 grandchildren.  Quit smoking 5 yrs ago, 15 pk yrs.  Not much exercise.  Works at [employer].    Review of Systems      See HPI   Physical Exam  General:     alert, well-developed, well-nourished, and well-hydrated.  obese Eyes:     conjunctiva clear Nose:     no nasal discharge.   Mouth:     pharynx pink and moist.   Neck:     no masses.   Lungs:     Normal respiratory effort, chest expands symmetrically. Lungs are clear to auscultation, no crackles or wheezes. Heart:     RRR, no murmurs Msk:     no joint swelling, no joint warmth, and no redness over joints.   Extremities:     no E/C/C Skin:     color normal.   Psych:     good eye contact, not anxious appearing, and  not depressed appearing.      Impression & Recommendations:  Problem # 1:  IMPAIRED FASTING GLUCOSE (ICD-790.21) 6 mos f/u fasting glucose 122--> 123 w/o much weight loss.  Will try to get her in with nutritionist and have her start checking sugars at home.  Work on diet and exercise, which needs to be increased gradually up to 60 min 5 days / wk for weight loss.  RTC 3 mos for CPE and recheck of fasting glucose.  3 points away from dx of Type 2 DM. Orders: Capillary Blood Glucose (82948) Fingerstick (11914)   Problem # 2:  HYPERLIPIDEMIA (ICD-272.4) Update labs.  Continue current meds and work on lifestyle changes. Her updated medication list for this problem includes:    Pravastatin Sodium 80 Mg Tabs (Pravastatin sodium) .Marland Kitchen... 1 tab by mouth qhs  Orders: T-Comprehensive Metabolic Panel (  [id number]) T-Lipid Profile [phone] [id number])  Labs Reviewed: Chol: 187 (04/24/2007)   HDL: 48 (04/24/2007)   LDL: 112 (10/24/2007)   TG: 154 (04/24/2007) SGOT: 18 (04/24/2007)   SGPT: 16 (04/24/2007)  Lipid Goals: Chol Goal: 200 (10/24/2007)   HDL Goal: 40 (10/24/2007)   LDL Goal: 160 (10/24/2007)   TG Goal: 150 (10/24/2007)  Prior 10 Yr Risk Heart Disease: 17 % (10/24/2007)   Complete Medication List: 1)  Diclofenac Sodium 75 Mg Tbec (Diclofenac sodium) .Marland Kitchen.. 1 tab by mouth two times a day prn 2)  Pravastatin Sodium 80 Mg Tabs (Pravastatin sodium) .Marland Kitchen.. 1 tab by mouth qhs 3)  Tylenol Extra Strength 500 Mg Tabs (Acetaminophen) .Marland Kitchen.. 1-2 tabs by mouth three times a day prn 4)  Accu-check Aviva  .... Use as directed 5)  Accu-check Aviva Test Strips  .... Use daily as directed   Patient Instructions: 1)  Start checking sugars:  AM fasting.  Goal is 90-110. 2)  Have fasting labs drawn today. 3)  I will send your results in the mail. 4)  Increase walking to 60 min 5 x a wk for weight loss. 5)  Return in 3 months for a complete physical and repeat fasting glucose.     Prescriptions: ACCU-CHECK  AVIVA TEST STRIPS use daily as directed  #30 x 6   Entered and Authorized by:   Seymour Bars DO   Signed by:   Seymour Bars DO on 05/06/2008   Method used:   Print then Give to Patient   RxID:   [id number] PRAVASTATIN SODIUM 80 MG  TABS (PRAVASTATIN SODIUM) 1 tab by mouth qhs  #90 x 2   Entered and Authorized by:   Seymour Bars DO   Signed by:   Seymour Bars DO on 05/06/2008   Method used:   Electronically to        [facility] [phone]* (retail)       [address] [place], [place]  [address]       Ph: [phone]       Fax: [phone]   RxID:   [id number] DICLOFENAC SODIUM 75 MG TBEC (DICLOFENAC SODIUM) 1 tab by mouth two times a day prn  #180 x 2   Entered and Authorized by:   Seymour Bars DO   Signed by:   Seymour Bars DO on 05/06/2008   Method used:   Electronically to        [facility] [phone]* (retail)       [address] [place], [place]  [address]       Ph: [phone]       Fax: [phone]   RxID:   [id number]   Laboratory Results   Blood Tests   Date/Time Recieved: May 06, 2008 8:42 AM  Date/Time Reported: May 06, 2008 8:42 AM   Glucose (fasting): 122 mg/dL   (Normal Range: 46-270)

## 2010-04-16 NOTE — Letter (Signed)
Summary: [facility]  [facility]   Imported By: Lanelle Bal 10/08/2009 09:45:13  _____________________________________________________________________  External Attachment:    Type:   Image     Comment:   External Document

## 2010-04-16 NOTE — Assessment & Plan Note (Signed)
Summary: INfected sebaceous cyst   Vital Signs:  Patient profile:   65 year old female Height:      64 inches Weight:      211 pounds BMI:     36.35 O2 Sat:      97 % Temp:     97.4 degrees F oral Pulse rate:   94 / minute BP sitting:   121 / 82  (left arm) Cuff size:   large  Vitals Entered By: Payton Spark CMA (Jul 23, 2008 2:03 PM) CC: Boil under R arm. Has been there for years but has gotten bigger and more painful in the last few days. Pain Assessment Patient in pain? yes     Location: boil Intensity: 10   History of Present Illness: Right axilla with a cyst for about 5 years. Suddenly became very painful over the last 4-5 days. No drainage. 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    Current Medications (verified): 1)  Pravastatin Sodium 80 Mg  Tabs (Pravastatin Sodium) .Marland Kitchen.. 1 Tab By Mouth Qhs 2)  Tylenol Extra Strength 500 Mg Tabs (Acetaminophen) .Marland Kitchen.. 1-2 Tabs By Mouth Three Times A Day Prn 3)  Accu-Check Aviva .... Use As Directed 4)  Accu-Check Aviva Test Strips .... Use Daily As Directed 5)  Meloxicam  Allergies (verified): No Known Drug Allergies  Comments:  Nurse/Medical Assistant: Payton Spark CMA (Jul 23, 2008 2:05 PM) The patient's medications were reviewed with the patient and were updated in the Medication List.  Social History:    Widowed 10-08.Marland Kitchen  Has 2 grown children, 4 grandchildren.  Quit smoking 5 yrs ago, 15 pk yrs.  Not much exercise.  Works at [employer].  Physical Exam  General:  Well-developed,well-nourished,in no acute distress; alert,appropriate and cooperative throughout examination Skin:  Vry inflammed sebaceous cyst approx 2cm.    Impression & Recommendations:  Problem # 1:  SEBACEOUS CYST, INFECTED (ICD-706.2) Once lesion was lanced she has lots of yellow drainage.  Culture was obtained.  Orders: T-Culture,Abcess ([id number]) I&D Abscess, Simple / Single (10060)  Complete Medication List: 1)  Pravastatin  Sodium 80 Mg Tabs (Pravastatin sodium) .Marland Kitchen.. 1 tab by mouth qhs 2)  Tylenol Extra Strength 500 Mg Tabs (Acetaminophen) .Marland Kitchen.. 1-2 tabs by mouth three times a day prn 3)  Accu-check Aviva  .... Use as directed 4)  Accu-check Aviva Test Strips  .... Use daily as directed 5)  Meloxicam   Procedure Note  Incision & Drainage: The patient complains of redness, irritation, inflammation, tenderness, and swelling but denies discharge and fever. Indication: infected lesion  Procedure # 1: I & D with packing    Size (in cm): 2.0 x 2.0    Region: right axilla    Comment: Verbal consent obatined.  xylocaine w/ epi 1% LOT [id number] EXP 9/11     Instrument used: #11 blade    Anesthesia: 1% lidocaine w/epinephrine  Cleaned and prepped with: alcohol and betadine Wound dressing: bulky gauze dressing Additional Instructions: Fu wound care reviewed.

## 2010-04-16 NOTE — Assessment & Plan Note (Signed)
Summary: gastroenteritis   Vital Signs:  Patient Profile:   65 Years Old Female Height:     64 inches Weight:      196 pounds O2 Sat:      95 % Temp:     97.2 degrees F Pulse rate:   81 / minute BP sitting:   122 / 79  (left arm) Cuff size:   large  Vitals Entered By: Harlene Salts (June 30, 2006 3:01 PM)              Prescriptions: PROMETHAZINE HCL 25 MG TABS (PROMETHAZINE HCL) 1 tab by mouth q 6 h as needed nausea  #15 x 0   Entered and Authorized by:   Seymour Bars D.O.   Signed by:   Seymour Bars D.O. on 06/30/2006   Method used:   Print then Give to Patient   RxID:   [id number] PROMETHAZINE HCL 25 MG TABS (PROMETHAZINE HCL) 1 tab by mouth q 6 h as needed nausea  #15 x 0   Entered and Authorized by:   Seymour Bars D.O.   Signed by:   Seymour Bars D.O. on 06/30/2006   Method used:   Print then Give to Patient   RxID:   [id number] PROMETHAZINE HCL 25 MG TABS (PROMETHAZINE HCL) 1 tab by mouth q 6 h as needed nausea  #15 x 0   Entered and Authorized by:   Seymour Bars D.O.   Signed by:   Seymour Bars D.O. on 06/30/2006   Method used:   Print then Give to Patient   RxID:   [id number]    Visit Type:  acute  Chief Complaint:  N/V since Monday.  Acute Visit History:      The patient complains of abdominal pain, diarrhea, headache, musculoskeletal symptoms, nasal discharge, nausea, and vomiting.  These symptoms began 3 days ago.  She denies chest pain, cough, fever, sinus problems, and sore throat.  Other comments include: cramping abd pains prior to diarrhea, otherwise no abd pain, no blood in stool- watery 4 x a day.  3-4 x a day vomiting- can't eat even crackers. not taking anything. chills.        Urine output has been normal.  There have been 4 diarrheal stools and 4 episodes of vomiting in the past 24 hours.  She is tolerating clear liquids.         Prior Medications: DICLOFENAC SODIUM 75 MG TBEC (DICLOFENAC SODIUM) 1 tab by mouth two times a day  prn PRAVASTATIN SODIUM 40 MG TABS (PRAVASTATIN SODIUM) 1 tab by mouth qhs TYLENOL EXTRA STRENGTH 500 MG TABS (ACETAMINOPHEN) 1-2 tabs by mouth three times a day prn Current Allergies: No known allergies      Review of Systems      See HPI   Physical Exam  General:     alert, well-developed, well-nourished, well-hydrated, and overweight-appearing.   Head:     normocephalic and atraumatic.   Eyes:     wears glasses, conjunctiva clear Nose:     no nasal discharge.   Mouth:     pharynx pink and moist.   Neck:     no masses.   Lungs:     Normal respiratory effort, chest expands symmetrically. Lungs are clear to auscultation, no crackles or wheezes. Heart:     Normal rate and regular rhythm. S1 and S2 normal without gallop, murmur, click, rub or other extra sounds. Abdomen:     diffusely TTP in both lower quadrants to  deep palpation.  soft, no distention, no masses, no guarding, no hepatomegaly, and no splenomegaly.   Extremities:     no E/C/C Skin:     color normal and no rashes.  no jaundice or pallor Cervical Nodes:     No lymphadenopathy noted    Impression & Recommendations:  Problem # 1:  GASTROENTERITIS, ACUTE (ICD-558.9) Discussed use of medication and role of diet. Encouraged clear liquids and electrolyte replacement fluids. Instructed to call if any signs of worsening dehydration by tomorrow afternoon.  Treat symptoms with Phenergan, Immodium and bland diet.  Out of work x 2 days.  Labs if not improving in 24 hrs.    Medications Added to Medication List This Visit: 1)  Promethazine Hcl 25 Mg Tabs (Promethazine hcl) .Marland Kitchen.. 1 tab by mouth q 6 h as needed nausea   Patient Instructions: 1)  Hold diclofenac while sick. 2)  Stick to a bland diet- bananas, rice, applesause, toast, gingerale for a few more days. 3)  Use Phenergan as needed for nausea- will make you sleepy. 4)  Use OTC Immodium AD as needed for diarrhea. 5)  If not improving by tomorrow afternoon,  please call.  May need to have bloodwork done if not improving.

## 2010-04-16 NOTE — Letter (Signed)
Summary: Out of Work  [facility]  [address]   [place], [place] [address]   Phone: [phone]  Fax: [phone]    June 30, 2006   Employee:  Paige PLANCARTE    To Whom It May Concern:   For Medical reasons, please excuse the above named employee from work for the following dates:  Start:06/30/06    End:07/01/06  and return on 07/02/06    If you need additional information, please feel free to contact our office.         Sincerely,    Seymour Bars D.O.

## 2010-04-16 NOTE — Assessment & Plan Note (Signed)
Summary: viral infection    Vital Signs:  Patient Profile:   65 Years Old Female Height:     64 inches Weight:      207 pounds BMI:     35.66 O2 Sat:      95 % Temp:     97.2 degrees F oral Pulse rate:   93 / minute BP sitting:   120 / 72  (left arm) Cuff size:   large  Vitals Entered By: Harlene Salts (Jul 26, 2007 1:53 PM)                 Chief Complaint:  Cold & URI symptoms.  Acute Visit History:      The patient complains of cough, diarrhea, fever, headache, nasal discharge, sinus problems, and vomiting.  These symptoms began 3 days ago.  She denies earache and nausea.  Other comments include: Began with hoarseness.  Tylenol Cold not helping much.  Missing work today.  .        The cough interferes with her sleep.  The character of the cough is described as productive.  She has no history of COPD.        She complains of nasal congestion.          Current Allergies: No known allergies   Past Medical History:    Reviewed history from 05/16/2006 and no changes required:        last knee injection 11-06 bilat     Review of Systems      See HPI   Physical Exam  General:     alert, obese, well-appearing female. Appears well-hydrated Head:     normocephalic and atraumatic.  sinuses NTTP Eyes:     Pupils equal, round and reactive to light. Conjunctiva clear. Ears:     TM's grey w/ normal landmarks Mouth:     Moist mucous membranes. Oropharynx not erythematous, no exudates. Neck:     supple with no cervical LAD Lungs:     CTAB, no crackles, wheezes, or rhonchi. Heart:     RRR, no murmurs Abdomen:     Normal bowel sounds, soft, non-tender, non-distended. no R/G/R Skin:     color normal.  slightly diaphoretic Cervical Nodes:     No lymphadenopathy noted    Impression & Recommendations:  Problem # 1:  VIRAL INFECTION, ACUTE (ICD-079.99) Viral infection with URI and GI symptoms.  Supportive care.  Hemodynamically stable.  Treat with Phenergan and  Immodium, bland diet, clear liquids, Tylenol / ibuprofen.  Out of work today and tomorrow.  Call if not improved in 1 wk. Her updated medication list for this problem includes:    Diclofenac Sodium 75 Mg Tbec (Diclofenac sodium) .Marland Kitchen... 1 tab by mouth two times a day prn    Tylenol Extra Strength 500 Mg Tabs (Acetaminophen) .Marland Kitchen... 1-2 tabs by mouth three times a day prn   Complete Medication List: 1)  Diclofenac Sodium 75 Mg Tbec (Diclofenac sodium) .Marland Kitchen.. 1 tab by mouth two times a day prn 2)  Pravastatin Sodium 80 Mg Tabs (Pravastatin sodium) .Marland Kitchen.. 1 tab by mouth qhs 3)  Tylenol Extra Strength 500 Mg Tabs (Acetaminophen) .Marland Kitchen.. 1-2 tabs by mouth three times a day prn 4)  Promethazine Hcl 25 Mg Tabs (Promethazine hcl) .... 1/2 to 1 tab by mouth q 6 hrs as needed nausea 5)  Accu-check Aviva  .... Use as directed 6)  Accu-check Aviva Test Strips  .... Use daily as directed   Patient Instructions:  1)  Use Phenergan as needed for nausea and Immodium (OTC) for diarrhea as needed. 2)  Bland diet, clear fluids. 3)  Tylenol or Motrin as needed. 4)  Call if not improved in 1 wk.     Prescriptions: ACCU-CHECK AVIVA TEST STRIPS use daily as directed  #30 x 6   Entered and Authorized by:   Seymour Bars DO   Signed by:   Seymour Bars DO on 07/26/2007   Method used:   Printed then faxed to ...       [facility] [address]*       [address] [place], [place]  [address]       Ph: [phone]       Fax: [phone]   RxID:   [id number] ACCU-CHECK AVIVA use as directed  #1 x 0   Entered and Authorized by:   Seymour Bars DO   Signed by:   Seymour Bars DO on 07/26/2007   Method used:   Printed then faxed to ...       [facility] [address]*       [address] [place], [place]  [address]       Ph: [phone]       Fax: [phone]   RxID:   [id number] PROMETHAZINE HCL 25 MG  TABS (PROMETHAZINE HCL) 1/2 to 1 tab by mouth q 6 hrs as needed nausea  #20 x 0   Entered and Authorized by:    Seymour Bars DO   Signed by:   Seymour Bars DO on 07/26/2007   Method used:   Electronically sent to ...       [address]*       [address] [place], [place]  [address]       Ph: [phone]       Fax: [phone]   RxID:   [id number]  ]

## 2010-04-16 NOTE — Letter (Signed)
Summary: Lipid Letter  [facility]  [address]   [place], [place] [address]   Phone: [phone]  Fax: [phone]    04/25/2007  Loni Delbridge [address] [place], [place]  [address]  Dear Ms. Neubauer:  We have carefully reviewed your last lipid profile from 04/24/2007 and the results are noted below with a summary of recommendations for lipid management.    Cholesterol:       187     Goal: < 200   HDL "good" Cholesterol:   48     Goal: > 49   LDL "bad" Cholesterol:   108     Goal: < 100   Triglycerides:       154     Goal: < 150    Cholestrol is not quite at goal.  Increase pravastatin to 80 mg (2 tabs) at bedtime each day in addition to TLC diet and exercise.  I will fill your next Rx for 80 mg tabs.  Repeat LDL in 6 mos. Kidney and liver function normal. Thyroid function normal.    TLC Diet (Therapeutic Lifestyle Change): Saturated Fats & Transfatty acids should be kept < 7% of total calories ***Reduce Saturated Fats Polyunstaurated Fat can be up to 10% of total calories Monounsaturated Fat Fat can be up to 20% of total calories Total Fat should be no greater than 25-35% of total calories Carbohydrates should be 50-60% of total calories Protein should be approximately 15% of total calories Fiber should be at least 20-30 grams a day ***Increased fiber may help lower LDL Total Cholesterol should be < 200mg /day Consider adding plant stanol/sterols to diet (example: Benacol spread) ***A higher intake of unsaturated fat may reduce Triglycerides and Increase HDL    Adjunctive Measures (may lower LIPIDS and reduce risk of Heart Attack) include: Aerobic Exercise (20-30 minutes 3-4 times a week) Limit Alcohol Consumption Weight Reduction Aspirin 75-81 mg a day by mouth (if not allergic or contraindicated) Vitamin E 400 IU a day by mouth Folic Acid 1mg  a day by mouth Dietary Fiber 20-30 grams a day by mouth     Current  Medications: 1)    Diclofenac Sodium 75 Mg Tbec (Diclofenac sodium) .Marland Kitchen.. 1 tab by mouth two times a day prn 2)    Pravastatin Sodium 40 Mg Tabs (Pravastatin sodium) .Marland Kitchen.. 1 tab by mouth qhs 3)    Tylenol Extra Strength 500 Mg Tabs (Acetaminophen) .Marland Kitchen.. 1-2 tabs by mouth three times a day prn  If you have any questions, please call. We appreciate being able to work with you.   Sincerely,    [facility] Seymour Bars DO

## 2010-04-16 NOTE — Letter (Signed)
Summary: Out of Work  [facility]  [address]   [place], [place] [address]   Phone: [phone]  Fax: [phone]    Jul 26, 2007   Employee:  KEMIA WENDEL    To Whom It May Concern:   For Medical reasons, please excuse the above named employee from work for the following dates:  Start:    End:    If you need additional information, please feel free to contact our office.         Sincerely,    Seymour Bars DO

## 2010-04-16 NOTE — Letter (Signed)
Summary: Out of Work  [facility]  [address]   [place], [place] [address]   Phone: [phone]  Fax: [phone]    June 30, 2006   Employee:  PALMER SHOREY    To Whom It May Concern:   For Medical reasons, please excuse the above named employee from work for the following dates:  Start:   06/30/06  End:   07/01/06 return on 07/02/06  If you need additional information, please feel free to contact our office.         Sincerely,    Seymour Bars D.O.

## 2010-04-16 NOTE — Progress Notes (Signed)
Summary: Requesting referral  Phone Note Call from Patient   Caller: Sister [phone] Summary of Call: Needs referral to [facility] ortho clinic [phone] for back and leg problems. Initial call taken by: Payton Spark CMA,  June 06, 2009 4:53 PM  Follow-up for Phone Call        for her arthritis?  done. Follow-up by: Seymour Bars DO,  June 09, 2009 8:00 AM  New Problems: LUMBAGO (ICD-724.2)   New Problems: LUMBAGO (ICD-724.2)

## 2010-04-16 NOTE — Assessment & Plan Note (Signed)
Summary: f/u IFG   Vital Signs:  Patient profile:   65 year old female Height:      64 inches Weight:      198 pounds BMI:     34.11 O2 Sat:      96 % on Room air Temp:     97.8 degrees F oral Pulse rate:   85 / minute BP sitting:   133 / 77  (left arm) Cuff size:   large  Vitals Entered By: Payton Spark CMA (April 03, 2009 9:16 AM)  O2 Flow:  Room air CC: F/U sugar   Primary Care Provider:  Seymour Bars D.O.  CC:  F/U sugar.  History of Present Illness: 65 yo WF presents for f/u IFG.  She had  been walking more but her L hip and knee have advanced degeneration causing her pain everyday.   Seeing Dr Leslee Home.    She has done synvisc which did not help.  Her steroid shots gave her minimal relief.  She is having a lot of pain  at work.  She is supposed to be walking w/ a cane.  She has been given an RX for Relafen but just started this yesterday.  She may have to retire.  She had spagetti last night.  Her AM fastings have been 90-120.   A1C is 6.2 today.  She lost 9 lbs in the last 6 mos.     Current Medications (verified): 1)  Pravastatin Sodium 80 Mg  Tabs (Pravastatin Sodium) .Marland Kitchen.. 1 Tab By Mouth Qhs 2)  Tylenol Extra Strength 500 Mg Tabs (Acetaminophen) .Marland Kitchen.. 1-2 Tabs By Mouth Three Times A Day Prn 3)  Accu-Check Aviva .... Use As Directed 4)  Accu-Check Aviva Test Strips .... Use Daily As Directed 5)  Diclofenac Sodium 75 Mg Tbec (Diclofenac Sodium) .... Take 1 Tablet By Mouth Two Times A Day As Needed 6)  Lovaza 1 Gm Caps (Omega-3-Acid Ethyl Esters) .... 4 Caps By Mouth Once Daily  Allergies (verified): No Known Drug Allergies  Past History:  Past Medical History: knee DJD, Hip DJD- Dr Leslee Home IFG obesity high cholesterol  Past Surgical History: Reviewed history from 05/06/2008 and no changes required. ankle surgery- R after fx  L knee arthroscopy  LTCSx 2  TAH  Family History: Reviewed history from 01/24/2006 and no changes required. 4  brothers, 1 sister- alive and healthy  father died- DM @ 54 mother- osteoporosis, hypothyroid  Social History: Reviewed history from 07/23/2008 and no changes required. Widowed 10-08.Marland Kitchen  Has 2 grown children, 4 grandchildren.  Quit smoking 5 yrs ago, 15 pk yrs.  Not much exercise.  Works at [employer].  Review of Systems      See HPI  Physical Exam  General:  obese WF in NAD Head:  normocephalic and atraumatic.   Eyes:  pupils equal, pupils round, and pupils reactive to light.  wears glasses Ears:  no external deformities.   Nose:  no nasal discharge.   Mouth:  pharynx pink and moist.   Neck:  no masses.   Lungs:  Normal respiratory effort, chest expands symmetrically. Lungs are clear to auscultation, no crackles or wheezes. Heart:  Normal rate and regular rhythm. S1 and S2 normal without gallop, murmur, click, rub or other extra sounds. Extremities:  no LE edema Skin:  color normal.   Cervical Nodes:  No lymphadenopathy noted Psych:  good eye contact, not anxious appearing, and not depressed appearing.     Impression & Recommendations:  Problem # 1:  IMPAIRED FASTING GLUCOSE (ICD-790.21) Fasting glucose 125 which is a little higher than her home readings have been.  A1C 6.2 c/w IFG. She is working on diet, exercise, wt loss.  Plan to recheck in 4 mos. Orders: Fingerstick ([address]) Glucose, (CBG) ([address]) T-Comprehensive Metabolic Panel ([address]-91478) Hemoglobin A1C (830[address])  Problem # 2:  HYPERLIPIDEMIA (ICD-272.4) Recheck labs today.  She never did start on Lovaza due to cost.  She is taking Pravastatin + Fish Oil and has been working on lifestyle changes. Her updated medication list for this problem includes:    Pravastatin Sodium 80 Mg Tabs (Pravastatin sodium) .Marland Kitchen... 1 tab by mouth qhs    Lovaza 1 Gm Caps (Omega-3-acid ethyl esters) .Marland KitchenMarland KitchenMarland KitchenMarland Kitchen 4 caps by mouth once daily  Orders: T-Comprehensive Metabolic Panel [phone]) T-Lipid Profile [phone])  Labs  Reviewed: SGOT: 14 (05/06/2008)   SGPT: 15 (05/06/2008)  Lipid Goals: Chol Goal: 200 (10/24/2007)   HDL Goal: 40 (10/24/2007)   LDL Goal: 160 (10/24/2007)   TG Goal: 150 (10/24/2007)  Prior 10 Yr Risk Heart Disease: 17 % (10/24/2007)   HDL:44 (05/06/2008), 48 (04/24/2007)  LDL:84 (05/06/2008), 108 (28/41/3244)  Chol:177 (05/06/2008), 187 (04/24/2007)  Trig:236 (09/02/2008), 245 (05/06/2008)  Problem # 3:  OSTEOARTHRITIS, 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  On NSAIDs + Tylenol.  She is putting off surgery for another year due to insurance.   Her updated medication list for this problem includes:    Tylenol Extra Strength 500 Mg Tabs (Acetaminophen) .Marland Kitchen... 1-2 tabs by mouth three times a day prn    Diclofenac Sodium 75 Mg Tbec (Diclofenac sodium) .Marland Kitchen... Take 1 tablet by mouth two times a day as needed  Problem # 4:  OBESITY, NOS (ICD-278.00) BMI 34 c/w class I obesity.  Has co-morbidities of DJD, hyperlipidemia and IFG. She has lost another 9 lbs which is excellent. Keep up the good work with healthy diet.  REcommend water aerobics given her advanced OA.    Complete Medication List: 1)  Pravastatin Sodium 80 Mg Tabs (Pravastatin sodium) .Marland Kitchen.. 1 tab by mouth qhs 2)  Tylenol Extra Strength 500 Mg Tabs (Acetaminophen) .Marland Kitchen.. 1-2 tabs by mouth three times a day prn 3)  Accu-check Aviva  .... Use as directed 4)  Accu-check Aviva Test Strips  .... Use daily as directed 5)  Diclofenac Sodium 75 Mg Tbec (Diclofenac sodium) .... Take 1 tablet by mouth two times a day as needed 6)  Lovaza 1 Gm Caps (Omega-3-acid ethyl esters) .... 4 caps by mouth once daily  Patient Instructions: 1)  Have fasting labs today. 2)  I will call you w/ results tomorrow. 3)  Stay on current meds. 4)  Work on  a diabetic diet, regular exercise (4 days/ wk) and wt loss. 5)  A1C 6.2, fasting glucose 125 c/w pre-diabetes. 6)  Return to recheck BP/ fasting sugar in 4 mos.  Laboratory Results   Blood Tests      HGBA1C: 6.2%   (Normal Range: Non-Diabetic - 3-6%   Control Diabetic - 6-8%) CBG Fasting:: 125mg /dL

## 2010-04-16 NOTE — Miscellaneous (Signed)
Summary: Immunization Entry   Immunization History:  Influenza Immunization History:    Influenza:  historical (12/22/2009)

## 2010-04-16 NOTE — Miscellaneous (Signed)
Summary: mammogram normal  Clinical Lists Changes  Observations: Added new observation of MAMMO DUE: 11/2007 (11/24/2006 12:03) Added new observation of MAMMOGRAM: No specific mammographic evidence of malignancy.  No significant changes compared to previous study.  Assessment: BIRADS 2. Location: [facility].    (11/15/2006 12:03)       Mammogram  Procedure date:  11/15/2006  Findings:      No specific mammographic evidence of malignancy.  No significant changes compared to previous study.  Assessment: BIRADS 2. Location: [facility].     Procedures Next Due Date:    Mammogram: 11/2007   Mammogram  Procedure date:  11/15/2006  Findings:      No specific mammographic evidence of malignancy.  No significant changes compared to previous study.  Assessment: BIRADS 2. Location: [facility].     Procedures Next Due Date:    Mammogram: 11/2007

## 2010-04-16 NOTE — Letter (Signed)
Summary: Internal Correspondence  Internal Correspondence   Imported By: Vanessa Swaziland 04/15/2009 14:54:44  _____________________________________________________________________  External Attachment:    Type:   Image     Comment:   INTERNAL

## 2010-04-16 NOTE — Letter (Signed)
Summary: Out of Work  [facility]  [address], [address]   [place], [place] [address]   Phone: [phone]  Fax: [phone]    Jul 23, 2008   Employee:  AZYRIAH NEVINS    To Whom It May Concern:   For Medical reasons, please excuse the above named employee from work for the following dates:  Start:   07-23-2008  End:   07-26-2008  If you need additional information, please feel free to contact our office.         Sincerely,    Nani Gasser MD

## 2010-04-16 NOTE — Miscellaneous (Signed)
Summary: Flu Shot/[facility]  Flu Shot/[facility]   Imported By: Lanelle Bal 01/02/2009 08:06:57  _____________________________________________________________________  External Attachment:    Type:   Image     Comment:   External Document

## 2010-04-16 NOTE — Progress Notes (Signed)
Summary: LAB RESULTS  ---- Converted from flag ---- ---- 11/16/2006 6:55 AM, Seymour Bars DO wrote: Pls let pt know that her kidney function is normal.  She still has mild elevation in blood sugars consistent with pre-diabetes state. ------------------------------  PATIENT INFORMED VIA MESSAGE MACHINE.LMPhone Note Outgoing Call Call back at [facility] Phone [phone]   Call placed by: Harlene Salts,  November 16, 2006 10:57 AM Call placed to: Patient Reason for Call: Discuss lab or test results

## 2010-04-16 NOTE — Assessment & Plan Note (Signed)
Summary: insulin resistance, high chol   Vital Signs:  Patient Profile:   65 Years Old Female Height:     64 inches Weight:      201.25 pounds Pulse rate:   78 / minute BP sitting:   137 / 87  (left arm)  Vitals Entered By: Edmonia Lynch (February 14, 2006 11:11 AM)          Prescriptions: PRAVASTATIN SODIUM 40 MG TABS (PRAVASTATIN SODIUM) 1 tab by mouth qhs  #90 x 3   Entered and Authorized by:   Seymour Bars D.O.   Signed by:   Seymour Bars D.O. on 02/14/2006   Method used:   Print then Give to Patient   RxID:   [id number] DICLOFENAC SODIUM 75 MG TBEC (DICLOFENAC SODIUM) 1 tab by mouth two times a day prn  #180 x 1   Entered and Authorized by:   Seymour Bars D.O.   Signed by:   Seymour Bars D.O. on 02/14/2006   Method used:   Print then Give to Patient   RxID:   [id number] PRAVASTATIN SODIUM 40 MG TABS (PRAVASTATIN SODIUM) 1 tab by mouth qhs  #30 x 6   Entered and Authorized by:   Seymour Bars D.O.   Signed by:   Seymour Bars D.O. on 02/14/2006   Method used:   Print then Give to Patient   RxID:   [id number] DICLOFENAC SODIUM 75 MG TBEC (DICLOFENAC SODIUM) 1 tab by mouth two times a day prn  #60 x 3   Entered and Authorized by:   Seymour Bars D.O.   Signed by:   Seymour Bars D.O. on 02/14/2006   Method used:   Print then Give to Patient   RxID:   [id number]    Visit Type:  f/u PCP:  Wynell Balloon  Chief Complaint:  insulin resistance.  History of Present Illness: 65 yo obese WF presents for a f/u of insulin resistance and hyperlipidemia.  She has successfully lost 20 lbs over the last 6 months by cutting down her portion sizes and exercising a little more.  Since our last visit, she had her plantar fascia injected by podiatry and has improved pain in feet.  Still suffers from knee OA, taking Diclofenac twice a day everday.  Feels well today.  Review of Systems  General      Denies chills, fatigue, fever, and sleep disorder.  Eyes      Denies  blurring.  CV      Denies chest pain or discomfort, shortness of breath with exertion, and swelling of feet.  GI      Denies bloody stools and change in bowel habits.  GU      Complains of urinary frequency and urinary hesitancy.      ongoing urinary symptoms for years, not on treatment  MS      Complains of joint pain.      bilat knee pain, chronic  Physical Exam  General:     alert, well-developed, well-nourished, well-hydrated, and overweight-appearing.   Head:     normocephalic and atraumatic.   Mouth:     good dentition and pharynx pink and moist.   Neck:     No deformities, masses, or tenderness noted. Lungs:     Normal respiratory effort, chest expands symmetrically. Lungs are clear to auscultation, no crackles or wheezes. Heart:     Normal rate and regular rhythm. S1 and S2 normal without gallop, murmur, click, rub or other extra sounds. Abdomen:  Bowel sounds positive,abdomen soft and non-tender without masses, organomegaly or hernias noted. Msk:     no knee effusions, + crepitus with knee flexion/extension Extremities:     trace left pedal edema.   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  Decreased from 112-->106 with weight loss.  Continue healthy eating and encouraged more exercise.  Hopefully with more wt loss, can avoid starting medication, otherwise, may need to start Metformin.  BP goal <130/80-- elevated for first time today, treat if high next visit.  H/O given on DASH diet.  Orders: Capillary Blood Glucose ([address]) Fingerstick ([address])   Problem # 2:  HYPERLIPIDEMIA (ICD-272.4) Due for a recheck of her fasting lipids since starting statin medication last April.  Will adjust dose accordingly.  F/U LFTS were normal in June. Lifestyle changes reviewed. Her updated  medication list for this problem includes:    Pravastatin Sodium 40 Mg Tabs (Pravastatin sodium) .Marland Kitchen... 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  If this is not effective at 2 month f/u, consider using NSAIDs compounding cream or doing Synvisc injections with orthopedics. Her updated medication list for this problem includes:    Diclofenac Sodium 75 Mg Tbec (Diclofenac sodium) .Marland Kitchen... 1 tab by mouth two times a day prn    Tylenol Extra Strength 500 Mg Tabs (Acetaminophen) .Marland Kitchen... 1-2 tabs by mouth three times a day prn   Patient Instructions: 1)  For arthritis, alternate Tylenol Arthritis with Diclofenac.  Try to limit Diclofenac to once daily.   2)  For elevated BP reading, limit salt in diet, recheck at next visit.  See info on DASH diet. 3)  Continue healthy eating and increase exercise for weight loss.  Good job so far! 4)  Will update your fasting cholestrol and I'll send your results in the mail.   5)  Try Detrol LA and let me know if you want a prescription.  Avoid caffeine for overactive bladder. 6)  F/U with me in 2 months for arthritis pain and bladder.  Laboratory Results      Date/Time Recieved:  Dec. 3, 2007/11:40am Date/Time Reported:  Dec. 3, 2007/11:40am  Blood Tests Glucose (fasting): 106 mg/dL   (Normal Range: 91-478)   Other Tests

## 2010-04-16 NOTE — Assessment & Plan Note (Signed)
Summary: impaired fasting glucose   Vital Signs:  Patient Profile:   65 Years Old Female Height:     64 inches Weight:      202 pounds Pulse rate:   78 / minute BP sitting:   128 / 82  (left arm) Cuff size:   large  Vitals Entered By: Harlene Salts (September 12, 2006 8:43 AM)               Visit Type:  f/u PCP:  Seymour Bars D.O.  Chief Complaint:  DM follow-up.  History of Present Illness: 65 yo obese WF here for f/u impaired fasting glucose.  Doing well but struggling with her weight.  Walking 20 min 3 x a wk with her sister.  Limited exercise due to OA in her knees.  Relies on Diclofenac 1-2 x everyday.  Has tried to eat more sugar-free foods.  Denies polyuria, polydipsia or blurred vision.  Denies paresthesias in feet.  Denies DOE or CP with exertion.    Due for mammogram at [facility] after 10/11/05.  Stopped having paps after TAH for non-cancerous reasons.  Denies vaginal bleeding.  Last CMP 3/08.  Last FLP 12/07.  Current Allergies: No known allergies      Review of Systems      See HPI   Physical Exam  General:     alert, well-developed, well-nourished, and well-hydrated.  obese Head:     normocephalic and atraumatic.   Eyes:     pupils equal, pupils round, and pupils reactive to light.  wears glasses Nose:     no external deformity and no nasal discharge.   Mouth:     upper dentures, pharynx pink and moist.   Neck:     no masses.  no carotid bruits Lungs:     normal respiratory effort, no intercostal retractions, no accessory muscle use, and normal breath sounds.   Heart:     normal rate, regular rhythm, and no murmur.   Abdomen:     soft and non-tender.  no AA bruits Extremities:     trace LE edema Skin:     color normal and no rashes.   Cervical Nodes:     No lymphadenopathy noted    Impression & Recommendations:  Problem # 1:  IMPAIRED FASTING GLUCOSE (ICD-790.21) Fasting glucose 118.  A1C 5.8.  Not on medications thus far. Agrees to working  on diabetic diet, increased exercise and a goal wt loss of 1 lb per wk.  Monitor fasting glucose in 4 months.   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  Denies GI side effects.  Check BMP q 6 mos for renal function.  Due in Sept. Her updated medication list for this problem includes:    Diclofenac Sodium 75 Mg Tbec (Diclofenac sodium) .Marland Kitchen... 1 tab by mouth two times a day prn    Tylenol Extra Strength 500 Mg Tabs (Acetaminophen) .Marland Kitchen... 1-2 tabs by mouth three times a day prn  Orders: T-Basic Metabolic Panel [id number])   Problem # 3:  OBESITY, NOS (ICD-278.00) Discussed goals for weight loss.  Pt agrees to making some changes.  Problem # 4:  HYPERLIPIDEMIA (ICD-272.4) RF'd Pravastatin.  Due for FLP and LFTs in December.  Working on lifestyle changes. Her updated medication list for this problem includes:    Pravastatin Sodium 40 Mg Tabs (Pravastatin sodium) .Marland Kitchen... 1 tab by mouth qhs  Patient Instructions: 1)  work on increasing exercise to 30-40 min 4-5 days a wk with a goal wt loss of 1 lb per wk. 2)  Work on healthy, low sugar, low carb diet for pre-diabetes. 3)  Stay on current meds. 4)  BMP - have drawn in September.  I will send you the results. 5)  Call to have mammogram at [facility] In July. 6)  F/U in 4 mos for pre-diabetes, weight f/u.    Prescriptions: PRAVASTATIN SODIUM 40 MG TABS (PRAVASTATIN SODIUM) 1 tab by mouth qhs  #90 x 2   Entered and Authorized by:   Seymour Bars D.O.   Signed by:   Seymour Bars D.O. on 09/12/2006   Method used:   Print then Give to Patient   RxID:   [id number] DICLOFENAC SODIUM 75 MG TBEC (DICLOFENAC SODIUM) 1 tab by mouth two times a day prn  #180 x 2   Entered and Authorized by:   Seymour Bars D.O.   Signed by:   Seymour Bars D.O. on 09/12/2006   Method used:   Print then Give to Patient   RxID:    [id number]   Laboratory Results   Blood Tests   Date/Time Recieved: September 12, 2006 9:19 AM  Date/Time Reported: September 12, 2006 9:19 AM   Glucose (fasting): 118 mg/dL   (Normal Range: 62-831) HGBA1C: 5.8%   (Normal Range: Non-Diabetic - 3-6%   Control Diabetic - 6-8%)

## 2010-04-16 NOTE — Letter (Signed)
Summary: [facility]  [id number]   Imported By: Lanelle Bal 10/08/2009 09:40:43  _____________________________________________________________________  External Attachment:    Type:   Image     Comment:   External Document

## 2010-06-11 ENCOUNTER — Encounter: Payer: Self-pay | Admitting: Family Medicine

## 2010-06-16 ENCOUNTER — Ambulatory Visit (INDEPENDENT_AMBULATORY_CARE_PROVIDER_SITE_OTHER): Payer: Medicare Other | Admitting: Family Medicine

## 2010-06-16 ENCOUNTER — Encounter: Payer: Self-pay | Admitting: Family Medicine

## 2010-06-16 DIAGNOSIS — M1711 Unilateral primary osteoarthritis, right knee: Secondary | ICD-10-CM | POA: Insufficient documentation

## 2010-06-16 DIAGNOSIS — M1710 Unilateral primary osteoarthritis, unspecified knee: Secondary | ICD-10-CM

## 2010-06-16 DIAGNOSIS — M1712 Unilateral primary osteoarthritis, left knee: Secondary | ICD-10-CM

## 2010-06-16 DIAGNOSIS — Z13220 Encounter for screening for lipoid disorders: Secondary | ICD-10-CM

## 2010-06-16 DIAGNOSIS — E669 Obesity, unspecified: Secondary | ICD-10-CM

## 2010-06-16 DIAGNOSIS — Z130 Encounter for screening for diseases of the blood and blood-forming organs and certain disorders involving the immune mechanism: Secondary | ICD-10-CM

## 2010-06-16 DIAGNOSIS — E785 Hyperlipidemia, unspecified: Secondary | ICD-10-CM

## 2010-06-16 DIAGNOSIS — R7301 Impaired fasting glucose: Secondary | ICD-10-CM

## 2010-06-16 DIAGNOSIS — D500 Iron deficiency anemia secondary to blood loss (chronic): Secondary | ICD-10-CM | POA: Insufficient documentation

## 2010-06-16 DIAGNOSIS — IMO0002 Reserved for concepts with insufficient information to code with codable children: Secondary | ICD-10-CM

## 2010-06-16 DIAGNOSIS — E8881 Metabolic syndrome: Secondary | ICD-10-CM | POA: Insufficient documentation

## 2010-06-16 MED ORDER — NYSTATIN 100000 UNIT/GM EX CREA: TOPICAL_CREAM | Freq: Two times a day (BID) | CUTANEOUS | Status: DC

## 2010-06-16 NOTE — Patient Instructions (Signed)
Fasting labs today.  RX Nystatin cream given for use under breasts.  Will call you with lab results tomorrow and will send copy to ortho.  Best of luck with upcoming surgery-- you will do great!  Return for a PHYSICAL in 6 mos.

## 2010-06-16 NOTE — Assessment & Plan Note (Signed)
Weight up from 198-->217 with a BMI of 37 in the class II obesity range.  We discussed the much needed long term plan for weight loss given her DJD, joint replacment, IFG and dyslipidemia.  Will see how she is doing with this during her welcome to medicare physical in 6 mos.

## 2010-06-16 NOTE — Progress Notes (Signed)
Subjective:    Patient ID: Paige Moore, female    DOB: 05-06-1945, 65 y.o.   MRN: [id number]  HPI 65 yo WF presents for f/u visit.  She had a L hip replacement in July 2011 with Dr Almyra Free.  She is doing great post op. She is walking w/o a cane and is planning to go back for a L total knee replacement on April 23rd.  She is not taking chest pain, SOB or pedal edema.  She uses only prn tylenol.  Currently, her walking is limited due to L knee pain. She plans to work on weight loss after her knee surgery.  She is due for fasting labs on Pravastatin.  She did have a post op complication of bleeding, requiring 2 days in the ICU and a blood transfusion following her hip replacement.  BP 134/87  Pulse 79  Ht 5\' 4"  (1.626 m)  Wt 217 lb (98.431 kg)  BMI 37.25 kg/m2  SpO2 96%  Past Medical History  Diagnosis Date  . DJD (degenerative joint disease) of knee   . DJD (degenerative joint disease) of hip   . IFG (impaired fasting glucose)   . Obesity   . Hyperlipidemia   . S/P left knee arthroscopy     Past Surgical History  Procedure Date  . Ankle fracture surgery     right  . Ltcs     X 2  . Total abdominal hysterectomy   . Joint replacement     L hip replacement, [facility] 09-2009    Family History  Problem Relation Age of Onset  . Osteoporosis Mother   . Hypothyroidism Mother   . Diabetes Father     History   Social History  . Marital Status: Married    Spouse Name: N/A    Number of Children: N/A  . Years of Education: N/A   Occupational History  . Not on file.   Social History Main Topics  . Smoking status: Former Smoker -- 15 years    Types: Cigarettes    Quit date: 03/15/2006  . Smokeless tobacco: Not on file  . Alcohol Use:   . Drug Use:   . Sexually Active:    Other Topics Concern  . Not on file   Social History Narrative  . No narrative on file    Not on File  Current outpatient prescriptions:acetaminophen (TYLENOL) 500 MG tablet, Take by mouth. 1-2  tabs po tid prn , Disp: , Rfl: ;  NON FORMULARY, Accu check Aviva and Accu Check Aviva Test Strips use as directed  , Disp: , Rfl: ;  pravastatin (PRAVACHOL) 80 MG tablet, Take 80 mg by mouth at bedtime.  , Disp: , Rfl: ;  nystatin (MYCOSTATIN) cream, Apply topically 2 (two) times daily., Disp: 30 g, Rfl: 2 omega-3 acid ethyl esters (LOVAZA) 1 G capsule, Take by mouth. 4 caps po once daily , Disp: , Rfl: ;  DISCONTD: diclofenac (VOLTAREN) 75 MG EC tablet, Take 75 mg by mouth 2 (two) times daily as needed.  , Disp: , Rfl:     Review of Systems  Constitutional: Negative for fatigue.  HENT: Negative for congestion.   Respiratory: Negative for shortness of breath.   Cardiovascular: Negative for chest pain, palpitations and leg swelling.  Gastrointestinal: Negative for constipation.  Musculoskeletal: Positive for joint swelling (L knee).  Neurological: Negative for headaches.  Hematological: Does not bruise/bleed easily.       Objective:   Physical Exam  Constitutional:  She appears well-developed and well-nourished. No distress.       obese  Eyes: Conjunctivae are normal.  Neck: Normal range of motion. Neck supple. No thyromegaly present.  Cardiovascular: Normal rate, regular rhythm and normal heart sounds.   No murmur heard. Pulmonary/Chest: Effort normal and breath sounds normal. No respiratory distress. She has no wheezes.  Musculoskeletal: Normal range of motion. She exhibits no edema.  Skin: Skin is warm.  Psychiatric: She has a normal mood and affect.          Assessment & Plan:

## 2010-06-16 NOTE — Assessment & Plan Note (Signed)
I will make adjustments to her pravastatin after getting her labs back tomorrow.

## 2010-06-16 NOTE — Assessment & Plan Note (Signed)
Will check her CBC with labs today.  Incidentally, she did suffer complication of postop bleeding from her L hip replacement, spending 2 days in the ICU at [facility] and needing a blood transfusion.

## 2010-06-16 NOTE — Assessment & Plan Note (Signed)
She is scheduled for L TKR with Dr Almyra Free in 2 wks.  Doing well.  I will fax him her labs from today.

## 2010-06-18 LAB — CBC WITH DIFFERENTIAL/PLATELET
Basophils Absolute: 0 10*3/uL (ref 0.0–0.1)
Basophils Relative: 0 % (ref 0–1)
Eosinophils Absolute: 0.1 10*3/uL (ref 0.0–0.7)
Eosinophils Relative: 1 % (ref 0–5)
HCT: 42.5 % (ref 36.0–46.0)
Hemoglobin: 14.5 g/dL (ref 12.0–15.0)
Lymphocytes Relative: 34 % (ref 12–46)
Lymphs Abs: 1.8 10*3/uL (ref 0.7–4.0)
MCH: 31.6 pg (ref 26.0–34.0)
MCHC: 34.1 g/dL (ref 30.0–36.0)
MCV: 92.6 fL (ref 78.0–100.0)
Monocytes Absolute: 0.2 10*3/uL (ref 0.1–1.0)
Monocytes Relative: 3 % (ref 3–12)
Neutro Abs: 3.3 10*3/uL (ref 1.7–7.7)
Neutrophils Relative %: 62 % (ref 43–77)
Platelets: 157 10*3/uL (ref 150–400)
RBC: 4.59 MIL/uL (ref 3.87–5.11)
RDW: 13.2 % (ref 11.5–15.5)
WBC: 5.4 10*3/uL (ref 4.0–10.5)

## 2010-06-18 LAB — LIPID PANEL
Cholesterol: 143 mg/dL (ref 0–200)
HDL: 43 mg/dL (ref >39)
LDL Cholesterol: 61 mg/dL (ref 0–99)
Total CHOL/HDL Ratio: 3.3 Ratio
Triglycerides: 194 mg/dL — ABNORMAL HIGH (ref <150)
VLDL: 39 mg/dL (ref 0–40)

## 2010-06-18 LAB — HEMOGLOBIN A1C
Hgb A1c MFr Bld: 6.1 % — ABNORMAL HIGH (ref <5.7)
Mean Plasma Glucose: 128 mg/dL — ABNORMAL HIGH (ref <117)

## 2010-06-19 ENCOUNTER — Telehealth: Payer: Self-pay | Admitting: Family Medicine

## 2010-06-19 LAB — COMPLETE METABOLIC PANEL WITH GFR
ALT: 14 U/L (ref 0–35)
AST: 10 U/L (ref 0–37)
Albumin: 4.6 g/dL (ref 3.5–5.2)
Alkaline Phosphatase: 71 U/L (ref 39–117)
BUN: 17 mg/dL (ref 6–23)
CO2: 26 mEq/L (ref 19–32)
Calcium: 9.6 mg/dL (ref 8.4–10.5)
Chloride: 104 mEq/L (ref 96–112)
Creat: 0.77 mg/dL (ref 0.40–1.20)
GFR, Est African American: >60 mL/min (ref >60)
GFR, Est Non African American: >60 mL/min (ref >60)
Glucose, Bld: 111 mg/dL — ABNORMAL HIGH (ref 70–99)
Potassium: 4.8 mEq/L (ref 3.5–5.3)
Sodium: 139 mEq/L (ref 135–145)
Total Bilirubin: 0.6 mg/dL (ref 0.3–1.2)
Total Protein: 7.1 g/dL (ref 6.0–8.3)

## 2010-06-19 NOTE — Telephone Encounter (Signed)
Pls let pt know that her blood counts came back normal (no anemia).  Her fasting sugar came back in the pre-diabetic range with normal liver and kidney function.  Cholesterol is fairly good other than elevated TGs.  Continue current meds and work on low sugar/ low carb diet/ regular exercise.

## 2010-06-22 NOTE — Telephone Encounter (Signed)
Pt aware of the above  

## 2010-06-25 ENCOUNTER — Telehealth: Payer: Self-pay | Admitting: Family Medicine

## 2010-06-25 ENCOUNTER — Encounter: Payer: Self-pay | Admitting: Family Medicine

## 2010-06-25 MED ORDER — PRAVASTATIN SODIUM 80 MG PO TABS: 80.0000 mg | ORAL_TABLET | Freq: Every day | ORAL | Status: DC

## 2010-06-25 NOTE — Telephone Encounter (Signed)
Patient needs to know if she is suppose to still be taking her Cholesterol medicine and if so, call it into [facility] on [address].  in [place]. Call patient back and let her know at [phone]. Thanks, [employer]

## 2010-06-26 ENCOUNTER — Telehealth: Payer: Self-pay | Admitting: Family Medicine

## 2010-06-26 NOTE — Telephone Encounter (Signed)
Refill sent.

## 2010-06-26 NOTE — Telephone Encounter (Signed)
Patient called yesterday on 06-25-10 requesting a refill on her cholesterol med. Call it into [facility] on [address].... Thanks , jennifer 06-26-10

## 2010-07-10 ENCOUNTER — Telehealth: Payer: Self-pay | Admitting: *Deleted

## 2010-07-10 NOTE — Telephone Encounter (Signed)
Pt left message at 1219 today requesting information on cheaper testing strips. I returned call to pt and advised her we do have meters here at the office and the manufacturer has a program for cheaper testing supplies.  Pt is interested.  Pt will be having knee replacement tomorrow (Saturday) and will be unable to come pick up any supplies.  Her sister, Owens Loffler, will come pick up supplies for pt.  Advised to ask for Darl Pikes.  Testing kit to be set aside for pt.

## 2010-07-16 ENCOUNTER — Telehealth: Payer: Self-pay | Admitting: Family Medicine

## 2010-07-16 NOTE — Telephone Encounter (Signed)
Pt sister called and said patient resting from surgery but she wanted to pup BS machine today or tomorrow for the patient.   PLAN:  Pt sister notified to come Friday to pup blood sugar machine for patient between 9-11am or 2-4pm.  Voiced understanding. [name], LPN Domingo Dimes

## 2010-07-17 NOTE — Telephone Encounter (Signed)
Pts sister came by the office and picked up new blood sugar machine for the pt which included strips(Freestyle), and Payton Spark notified and she will set up the patient for the "Free program" for patient to receive add'l supplies.  2 additional boxes of 10 ech of strips were given. Plan:  Routed to Payton Spark, CMA as reminder to set up pt on program. [name], LPN [facility] Dimes

## 2010-07-23 ENCOUNTER — Encounter: Payer: Self-pay | Admitting: Family Medicine

## 2010-07-31 NOTE — Op Note (Signed)
NAME:  Paige Moore, Paige Moore                           ACCOUNT NO.:  [id number]   MEDICAL RECORD NO.:  [id number]                   PATIENT TYPE:  AMB   LOCATION:  DAY                                  FACILITY:  [facility]   PHYSICIAN:  Marlowe Kays, M.D.               DATE OF BIRTH:  1945/08/20   DATE OF PROCEDURE:  12/02/2003  DATE OF DISCHARGE:                                 OPERATIVE REPORT   PREOPERATIVE DIAGNOSIS:  Chronic plantar fasciitis, right heel.   POSTOPERATIVE DIAGNOSIS:  Chronic plantar fasciitis, right heel.   OPERATION:  Release plantar fascia right heel.   SURGEON:  Marlowe Kays, M.D.   ASSISTANT:  Nurse.   ANESTHESIA:  General.   INDICATIONS FOR PROCEDURE:  She has had roughly five months of persistent  right heel pain refractory to nonsurgical treatment, no heel spur on x-ray.  She requests surgical release.   PROCEDURE:  After satisfactory general anesthesia and pneumatic tourniquet,  the leg was had Esmarch placed nonsterilely and the right foot and ankle  prepped with DuraPrep and draped in a sterile field.  I made a 2-3 cm  incision over the posterior longitudinal arch of the foot overlapping the  heel slightly.  The plantar fascia was identified and separated out from the  overlying muscle and underlying soft tissue and released across the entire  width of the heel with some small Metzenbaum scissors.  It was very  thickened and gritty consistent with chronic inflammation and scar.  At the  conclusion of the release, I was able to place my little finger all the way  across the heel without feeling any residual fibers.  The wound was  irrigated with sterile saline and infiltrated with 0.5% pain Marcaine.  The  subcutaneous tissues were closed with interrupted 3-0 Vicryl.  The skin was  closed with 4-0 nylon mattress sutures.  Dry, sterile dressing was applied.  The tourniquet was released.  At the time of this dictation, she was on the  way to the recovery  room in satisfactory condition without complications.      JA/MEDQ  D:  12/02/2003  T:  12/02/2003  Job:  [id number]

## 2010-09-21 ENCOUNTER — Ambulatory Visit (INDEPENDENT_AMBULATORY_CARE_PROVIDER_SITE_OTHER): Payer: Medicare Other | Admitting: Family Medicine

## 2010-09-21 ENCOUNTER — Encounter: Payer: Self-pay | Admitting: Family Medicine

## 2010-09-21 VITALS — BP 143/94 | HR 100 | Temp 98.5°F | Wt 209.0 lb

## 2010-09-21 DIAGNOSIS — L723 Sebaceous cyst: Secondary | ICD-10-CM

## 2010-09-21 NOTE — Progress Notes (Signed)
  Subjective:    Patient ID: Paige Moore, female    DOB: 04-03-1945, 65 y.o.   MRN: [id number]  HPI 65 yo WF presents for a sebaceous cyst on the L upper back.  It has been there for 20 yrs but lately is bothering her more.  Started draining last night.  Denies F/C.    BP 143/94  Pulse 100  Temp(Src) 98.5 F (36.9 C) (Oral)  Wt 209 lb (94.802 kg)  SpO2 94%   Review of Systems  Constitutional: Negative for fever and chills.       Objective:   Physical Exam    2 x 2.4 cm sebaceous cyst over the L upper shoulder, slightly tender w/o skin discoloration. A tiny pore is draining sebaceous fluid.  Given pt's verbal consent, cyst was cleaned with betadine and alcohol followed by 2 cc of 1% lidocaine with epi ( lot #[id number], exp 12/13), a small incision made over the middle with 11 blade scalpel.  Was able to extrude lots of sebaceous material using hemostats.  No bleeding.  Culture obtained.  Pt tolerated procedure well w/o complication.  1/4" iodoform packing placed and a gauze dressing with bacitracin used.  Wound care instructions given to pt.    Assessment & Plan:  Sebaceous cyst -- treated successfully with I& D today.  Can use ice pack and advil for pain today and tomorrow.  Call if any sign of infection. Remove packing FRI if still intact.

## 2010-09-21 NOTE — Patient Instructions (Signed)
Clean cyst area with antibacterial soap and water on a washcloth.   If packing falls out today or tomorrow, return to repack.  Cover with polysporin ointment and a bandaid until scab forms.  Call if any problems.   OK to remove packing on FRI (in the shower is best).

## 2010-09-23 NOTE — Progress Notes (Signed)
Addended by: Glennis Brink on: 09/23/2010 02:55 PM   Modules accepted: Orders

## 2010-09-26 LAB — WOUND CULTURE
Gram Stain: NONE SEEN
Gram Stain: NONE SEEN

## 2010-09-28 ENCOUNTER — Telehealth: Payer: Self-pay | Admitting: Family Medicine

## 2010-09-28 NOTE — Telephone Encounter (Signed)
Call patient: Wound grew out Proteus mirabilis. Please let me know if the wound is not healing. It is healing on its own then we do not usually treat with antibiotics. It is not healing well the please let me know.

## 2010-09-29 NOTE — Telephone Encounter (Signed)
Pt is doing well.

## 2010-12-08 ENCOUNTER — Encounter: Payer: Self-pay | Admitting: Family Medicine

## 2010-12-16 ENCOUNTER — Ambulatory Visit (INDEPENDENT_AMBULATORY_CARE_PROVIDER_SITE_OTHER): Payer: Medicare Other | Admitting: Family Medicine

## 2010-12-16 ENCOUNTER — Encounter: Payer: Self-pay | Admitting: Family Medicine

## 2010-12-16 VITALS — BP 126/82 | HR 76 | Wt 214.0 lb

## 2010-12-16 DIAGNOSIS — Z0000 Encounter for general adult medical examination without abnormal findings: Secondary | ICD-10-CM

## 2010-12-16 DIAGNOSIS — Z1211 Encounter for screening for malignant neoplasm of colon: Secondary | ICD-10-CM

## 2010-12-16 DIAGNOSIS — Z23 Encounter for immunization: Secondary | ICD-10-CM

## 2010-12-16 DIAGNOSIS — E785 Hyperlipidemia, unspecified: Secondary | ICD-10-CM

## 2010-12-16 DIAGNOSIS — R7301 Impaired fasting glucose: Secondary | ICD-10-CM

## 2010-12-16 MED ORDER — PRAVASTATIN SODIUM 80 MG PO TABS: 80.0000 mg | ORAL_TABLET | Freq: Every day | ORAL | Status: DC

## 2010-12-16 NOTE — Progress Notes (Signed)
Subjective:    Paige Moore is a 65 y.o. female who presents for a welcome to Medicare exam.   Cardiac risk factors: advanced age (older than 2 for men, 55 for women).  Activities of Daily Living  In your present state of health, do you have any difficulty performing the following activities?:  Preparing food and eating?: NO Bathing yourself: No Getting dressed: No Using the toilet:No Moving around from place to place: No In the past year have you fallen or had a near fall?:No  Current exercise habits: Home exercise routine includes walking 0.5 hrs per 4 days per week.   Dietary issues discussed: Feels she eats a balanced diet.    Depression Screen (Note: if answer to either of the following is "Yes", then a more complete depression screening is indicated)  Q1: Over the past two weeks, have you felt down, depressed or hopeless?no Q2: Over the past two weeks, have you felt little interest or pleasure in doing things? no   The following portions of the patient's history were reviewed and updated as appropriate: allergies, current medications, past family history, past medical history, past social history, past surgical history and problem list. Review of Systems A comprehensive review of systems was negative.    Objective:     Vision by Snellen chart: right eye:20/20, left eye:20/30 Blood pressure 126/82, pulse 76, weight 214 lb (97.07 kg). There is no height on file to calculate BMI. BP 126/82  Pulse 76  Wt 214 lb (97.07 kg)  General Appearance:    Alert, cooperative, no distress, appears stated age, she is obese.  Head:    Normocephalic, without obvious abnormality, atraumatic  Eyes:    PERRL, conjunctiva/corneas clear, EOM's intact, fundi    benign, both eyes  Ears:    Normal TM's and external ear canals, both ears  Nose:   Nares normal, septum midline, mucosa normal, no drainage    or sinus tenderness  Throat:   Lips, mucosa, and tongue normal; teeth and gums normal   Neck:   Supple, symmetrical, trachea midline, no adenopathy;    thyroid:  no enlargement/tenderness/nodules; no carotid   bruit or JVD  Back:     Symmetric, no curvature, ROM normal, no CVA tenderness  Lungs:     Clear to auscultation bilaterally, respirations unlabored  Chest Wall:    No tenderness or deformity   Heart:    Regular rate and rhythm, S1 and S2 normal, no murmur, rub   or gallop  Breast Exam:    No tenderness, masses, or nipple abnormality  Abdomen:     Soft, non-tender, bowel sounds active all four quadrants,    no masses, no organomegaly  Genitalia:    Not done.    Rectal:    Not done  Extremities:   Extremities normal, atraumatic, no cyanosis or edema  Pulses:   2+ and symmetric all extremities  Skin:   Skin color, texture, turgor normal, no rashes or lesions  Lymph nodes:   Cervical, supraclavicular, and axillary nodes normal  Neurologic:   CNII-XII intact, normal strength, sensation and reflexes    throughout      Assessment:    Welcome Medicare Exam.      Plan:  Discussed getting regular exercise and eating a healthy diet.    During the course of the visit the patient was educated and counseled about appropriate screening and preventive services including:   Pneumococcal vaccine   Influenza vaccine  Td vaccine  Screening  electrocardiogram  Screening mammography  Colorectal cancer screening  Patient Instructions (the written plan) was given to the patient.   EKG shows normal sinus rhythm, rate of 71 beats per minute she does have some left axis deviation but no acute changes.

## 2010-12-16 NOTE — Assessment & Plan Note (Signed)
Lab Results  Component Value Date   HGBA1C 6.1* 06/16/2010  Recheck today.

## 2010-12-16 NOTE — Patient Instructions (Addendum)
  During the course of the visit the patient was educated and counseled about appropriate screening and preventive services including:   Pneumococcal vaccine   Influenza vaccine  Td vaccine  Screening electrocardiogram  Screening mammography  Colorectal cancer screening  We will call you for the referral for colonoscopy.   EKG today shows NSR, rate of 71 bpm, LT axis deviation.

## 2010-12-17 LAB — TRIGLYCERIDES: Triglycerides: 179 mg/dL — ABNORMAL HIGH (ref <150)

## 2010-12-17 LAB — BASIC METABOLIC PANEL WITH GFR
BUN: 22 mg/dL (ref 6–23)
CO2: 20 mEq/L (ref 19–32)
Calcium: 9.6 mg/dL (ref 8.4–10.5)
Chloride: 107 mEq/L (ref 96–112)
Creat: 0.68 mg/dL (ref 0.50–1.10)
GFR, Est African American: >60 mL/min (ref >60)
GFR, Est Non African American: >60 mL/min (ref >60)
Glucose, Bld: 89 mg/dL (ref 70–99)
Potassium: 4.4 mEq/L (ref 3.5–5.3)
Sodium: 141 mEq/L (ref 135–145)

## 2010-12-17 LAB — HEMOGLOBIN A1C
Hgb A1c MFr Bld: 6 % — ABNORMAL HIGH (ref <5.7)
Mean Plasma Glucose: 126 mg/dL — ABNORMAL HIGH (ref <117)

## 2010-12-18 ENCOUNTER — Telehealth: Payer: Self-pay | Admitting: *Deleted

## 2010-12-18 NOTE — Telephone Encounter (Signed)
LMOM to call back

## 2010-12-18 NOTE — Telephone Encounter (Signed)
Pt returning call about her lab results. Plan:  Pt informed of lab results with recommendations. [name], LPN [facility] Dimes

## 2010-12-18 NOTE — Telephone Encounter (Signed)
Message copied by Wyline Beady on Fri Dec 18, 2010 10:49 AM ------      Message from: Nani Gasser D      Created: Thu Dec 17, 2010  8:21 AM       A1c is down to 6.0. This looks great. Normal is under 5.7 so we are getting there. Recheck in 6 months. Triglycerides are a little bit better at 179 which is down from 194 but doesn't look as good as it did a year ago at 135. Continue to work on exercise and low-fat diet. Electrolytes and kidney function are normal.

## 2010-12-21 ENCOUNTER — Telehealth: Payer: Self-pay | Admitting: *Deleted

## 2010-12-21 NOTE — Telephone Encounter (Signed)
Pt notified of results and MD isntructions. KJ LPN 

## 2010-12-21 NOTE — Telephone Encounter (Signed)
Message copied by Lanae Crumbly on Mon Dec 21, 2010  9:26 AM ------      Message from: Nani Gasser D      Created: Thu Dec 17, 2010  8:21 AM       A1c is down to 6.0. This looks great. Normal is under 5.7 so we are getting there. Recheck in 6 months. Triglycerides are a little bit better at 179 which is down from 194 but doesn't look as good as it did a year ago at 135. Continue to work on exercise and low-fat diet. Electrolytes and kidney function are normal.

## 2010-12-28 ENCOUNTER — Encounter: Payer: Self-pay | Admitting: Family Medicine

## 2011-02-18 ENCOUNTER — Encounter: Payer: Self-pay | Admitting: Family Medicine

## 2011-06-23 ENCOUNTER — Encounter: Payer: Self-pay | Admitting: Family Medicine

## 2011-06-23 ENCOUNTER — Ambulatory Visit (INDEPENDENT_AMBULATORY_CARE_PROVIDER_SITE_OTHER): Payer: Medicare Other | Admitting: Family Medicine

## 2011-06-23 VITALS — BP 109/65 | HR 74 | Ht 64.0 in | Wt 219.0 lb

## 2011-06-23 DIAGNOSIS — E785 Hyperlipidemia, unspecified: Secondary | ICD-10-CM

## 2011-06-23 DIAGNOSIS — E8881 Metabolic syndrome: Secondary | ICD-10-CM

## 2011-06-23 DIAGNOSIS — R7309 Other abnormal glucose: Secondary | ICD-10-CM

## 2011-06-23 DIAGNOSIS — Z23 Encounter for immunization: Secondary | ICD-10-CM

## 2011-06-23 LAB — LIPID PANEL
Cholesterol: 129 mg/dL (ref 0–200)
HDL: 47 mg/dL (ref >39)
LDL Cholesterol: 46 mg/dL (ref 0–99)
Total CHOL/HDL Ratio: 2.7 Ratio
Triglycerides: 182 mg/dL — ABNORMAL HIGH (ref <150)
VLDL: 36 mg/dL (ref 0–40)

## 2011-06-23 LAB — POCT GLYCOSYLATED HEMOGLOBIN (HGB A1C): Hemoglobin A1C: 6.2

## 2011-06-23 MED ORDER — AMBULATORY NON FORMULARY MEDICATION: Status: DC

## 2011-06-23 NOTE — Progress Notes (Signed)
  Subjective:    Patient ID: Paige Moore, female    DOB: January 30, 1946, 66 y.o.   MRN: [id number]  HPI IFG - sugars have been running 80-130. Mostly around 100.  No inc thirst or urination. Here to recheck A1C. she does need new prescriptions for lancets and strips. She has been trying to get some exercise. Diet has been fair.  Hyperlipidemia-she is doing well on her statin. She denies any problems or complications of the medication. No myalgias.   Review of Systems     Objective:   Physical Exam  Constitutional: She is oriented to person, place, and time. She appears well-developed and well-nourished.  HENT:  Head: Normocephalic and atraumatic.  Cardiovascular: Normal rate, regular rhythm and normal heart sounds.   Pulmonary/Chest: Effort normal and breath sounds normal.  Neurological: She is alert and oriented to person, place, and time.  Skin: Skin is warm and dry.  Psychiatric: She has a normal mood and affect. Her behavior is normal.          Assessment & Plan:  IFG - A1C is 6.2. Stable.  Cotinue to work on diet and exercise.  F/U in the  6 monhts. Will send prescription for lancets and strips as well.  Hyperlipidemia - she is tolerating the medication well. She's due for CMP and lipid panel. Refills were sent to her pharmacy. We can adjust medication if needed.  Pneumonia vaccine given today.

## 2011-06-24 LAB — COMPLETE METABOLIC PANEL WITH GFR
ALT: 15 U/L (ref 0–35)
AST: 15 U/L (ref 0–37)
Albumin: 4.2 g/dL (ref 3.5–5.2)
Alkaline Phosphatase: 73 U/L (ref 39–117)
BUN: 22 mg/dL (ref 6–23)
CO2: 23 mEq/L (ref 19–32)
Calcium: 9.5 mg/dL (ref 8.4–10.5)
Chloride: 107 mEq/L (ref 96–112)
Creat: 0.64 mg/dL (ref 0.50–1.10)
GFR, Est African American: >89 mL/min
GFR, Est Non African American: >89 mL/min
Glucose, Bld: 102 mg/dL — ABNORMAL HIGH (ref 70–99)
Potassium: 3.9 mEq/L (ref 3.5–5.3)
Sodium: 138 mEq/L (ref 135–145)
Total Bilirubin: 0.5 mg/dL (ref 0.3–1.2)
Total Protein: 7 g/dL (ref 6.0–8.3)

## 2011-12-27 ENCOUNTER — Encounter: Payer: Self-pay | Admitting: Family Medicine

## 2011-12-27 ENCOUNTER — Ambulatory Visit (INDEPENDENT_AMBULATORY_CARE_PROVIDER_SITE_OTHER): Payer: Medicare Other | Admitting: Family Medicine

## 2011-12-27 VITALS — BP 124/73 | HR 84 | Ht 64.0 in | Wt 219.0 lb

## 2011-12-27 DIAGNOSIS — R7301 Impaired fasting glucose: Secondary | ICD-10-CM

## 2011-12-27 DIAGNOSIS — E785 Hyperlipidemia, unspecified: Secondary | ICD-10-CM

## 2011-12-27 DIAGNOSIS — Z23 Encounter for immunization: Secondary | ICD-10-CM

## 2011-12-27 LAB — POCT GLYCOSYLATED HEMOGLOBIN (HGB A1C): Hemoglobin A1C: 5.9

## 2011-12-27 MED ORDER — PRAVASTATIN SODIUM 80 MG PO TABS: 80.0000 mg | ORAL_TABLET | Freq: Every day | ORAL | Status: DC

## 2011-12-27 MED ORDER — AMBULATORY NON FORMULARY MEDICATION: Status: DC

## 2011-12-27 NOTE — Progress Notes (Signed)
  Subjective:    Patient ID: Paige Moore, female    DOB: 07-12-1945, 66 y.o.   MRN: [id number]  HPI IFG - HAs been walking for exercise and has been watching what she eats.  Here to recheck a1C from 6 months ago  Hyperlipidemia-doing well on her medication. No side effects or myalgias. She does need refills on her pravastatin. Her last labs were checked in April and her LDL and total cholesterol were at goal. Triglycerides are slightly elevated but she has been trying to walk for exercise.   Review of Systems     Objective:   Physical Exam  Constitutional: She is oriented to person, place, and time. She appears well-developed and well-nourished.  HENT:  Head: Normocephalic and atraumatic.  Neck: Neck supple. No thyromegaly present.  Cardiovascular: Normal rate, regular rhythm and normal heart sounds.   Pulmonary/Chest: Effort normal and breath sounds normal.  Lymphadenopathy:    She has no cervical adenopathy.  Neurological: She is alert and oriented to person, place, and time.  Skin: Skin is warm and dry.  Psychiatric: She has a normal mood and affect. Her behavior is normal.          Assessment & Plan:  IFG - her A1C is better today. Congratulated her on her efforts.  Repeat A1c in 6 months. Lab Results  Component Value Date   HGBA1C 5.9 12/27/2011   Hyperlipidemia-continue work on diet, exercise and weight loss. Refilled her pravastatin today. Recheck lipids again in April.  Shot given today.  Also discussed the importance of sales vaccine. Given a perception for her to take to the pharmacy as it is covered under her Medicare part D.

## 2012-01-03 ENCOUNTER — Telehealth: Payer: Self-pay

## 2012-01-03 NOTE — Telephone Encounter (Signed)
Paige Moore wants a 90 day sample of pravastatin. She complains this has not been done. I called [facility] and the prescription has been ready for 8 days.

## 2012-04-05 ENCOUNTER — Encounter: Payer: Self-pay | Admitting: Family Medicine

## 2012-06-26 ENCOUNTER — Ambulatory Visit: Payer: Medicare Other | Admitting: Family Medicine

## 2012-07-03 ENCOUNTER — Ambulatory Visit (INDEPENDENT_AMBULATORY_CARE_PROVIDER_SITE_OTHER): Payer: Medicare Other | Admitting: Family Medicine

## 2012-07-03 ENCOUNTER — Encounter: Payer: Self-pay | Admitting: Family Medicine

## 2012-07-03 VITALS — BP 118/80 | HR 78 | Ht 64.0 in | Wt 218.0 lb

## 2012-07-03 DIAGNOSIS — R259 Unspecified abnormal involuntary movements: Secondary | ICD-10-CM

## 2012-07-03 DIAGNOSIS — R7301 Impaired fasting glucose: Secondary | ICD-10-CM

## 2012-07-03 DIAGNOSIS — R251 Tremor, unspecified: Secondary | ICD-10-CM

## 2012-07-03 DIAGNOSIS — B49 Unspecified mycosis: Secondary | ICD-10-CM

## 2012-07-03 DIAGNOSIS — E785 Hyperlipidemia, unspecified: Secondary | ICD-10-CM

## 2012-07-03 DIAGNOSIS — B379 Candidiasis, unspecified: Secondary | ICD-10-CM

## 2012-07-03 LAB — CBC
HCT: 42.1 % (ref 36.0–46.0)
Hemoglobin: 14.4 g/dL (ref 12.0–15.0)
MCH: 30.8 pg (ref 26.0–34.0)
MCHC: 34.2 g/dL (ref 30.0–36.0)
MCV: 90.1 fL (ref 78.0–100.0)
Platelets: 170 10*3/uL (ref 150–400)
RBC: 4.67 MIL/uL (ref 3.87–5.11)
RDW: 13.7 % (ref 11.5–15.5)
WBC: 5.6 10*3/uL (ref 4.0–10.5)

## 2012-07-03 LAB — COMPLETE METABOLIC PANEL WITH GFR
ALT: 18 U/L (ref 0–35)
AST: 18 U/L (ref 0–37)
Albumin: 4.4 g/dL (ref 3.5–5.2)
Alkaline Phosphatase: 68 U/L (ref 39–117)
BUN: 21 mg/dL (ref 6–23)
CO2: 27 mEq/L (ref 19–32)
Calcium: 9.7 mg/dL (ref 8.4–10.5)
Chloride: 107 mEq/L (ref 96–112)
Creat: 0.83 mg/dL (ref 0.50–1.10)
GFR, Est African American: 85 mL/min
GFR, Est Non African American: 74 mL/min
Glucose, Bld: 103 mg/dL — ABNORMAL HIGH (ref 70–99)
Potassium: 4.1 mEq/L (ref 3.5–5.3)
Sodium: 142 mEq/L (ref 135–145)
Total Bilirubin: 0.6 mg/dL (ref 0.3–1.2)
Total Protein: 7.4 g/dL (ref 6.0–8.3)

## 2012-07-03 LAB — LIPID PANEL
Cholesterol: 124 mg/dL (ref 0–200)
HDL: 49 mg/dL (ref >39)
LDL Cholesterol: 50 mg/dL (ref 0–99)
Total CHOL/HDL Ratio: 2.5 Ratio
Triglycerides: 125 mg/dL (ref <150)
VLDL: 25 mg/dL (ref 0–40)

## 2012-07-03 LAB — POCT GLYCOSYLATED HEMOGLOBIN (HGB A1C): Hemoglobin A1C: 6.3

## 2012-07-03 LAB — VITAMIN B12: Vitamin B-12: 933 pg/mL — ABNORMAL HIGH (ref 211–911)

## 2012-07-03 MED ORDER — FLUCONAZOLE 150 MG PO TABS: 150.0000 mg | ORAL_TABLET | Freq: Every day | ORAL | Status: DC

## 2012-07-03 NOTE — Progress Notes (Signed)
  Subjective:    Patient ID: Paige Moore, female    DOB: Jul 31, 1945, 67 y.o.   MRN: [id number]  HPI IFG - Says has been eating sweets. No inc thrist or urination.    Has an itcy burning rash in her groin area and occ gets it under her breasts .  Says nystatatin didn't work that well.   Hyperlipidemia - Tolerating statin well. No CP or SOB.    Tremor - startin abot 6-8 months ago. Only happens at rest. No aggrevating factors.  No fam hx of tremor.  Not sure it happens everyday.  No numbness in the hand. Not sure if anxiety or caffeine aggrevates it. Doesn drink alochol. No mental or speech changes.  No other sxs at this time such as shuffling gait, speech changes, handwriting changes, dementia, falls, etc.      Review of Systems     Objective:   Physical Exam  Constitutional: She is oriented to person, place, and time. She appears well-developed and well-nourished.  HENT:  Head: Normocephalic and atraumatic.  Cardiovascular: Normal rate, regular rhythm and normal heart sounds.   Pulmonary/Chest: Effort normal and breath sounds normal.  Musculoskeletal:  Tremor in the right hand. Almost pill rolling.     Neurological: She is alert and oriented to person, place, and time. No cranial nerve deficit. She exhibits normal muscle tone.  Normal gait  Skin: Skin is warm and dry.  Psychiatric: She has a normal mood and affect. Her behavior is normal.          Assessment & Plan:  IFG - Stable, but A1C is up from last time. Discussed exercise and low carb diet. Avoid eating sweets. F/U in 6 months.  Has been walking for exercise.   Due for CMP.  Lab Results  Component Value Date   HGBA1C 6.3 07/03/2012   Yeast vaginitis - will tx with oral diflucan.  Call if not better in one week. Avoid using special cleaners, salves, creams, etc.    Tremor - one sided and at rest.  Consider essential tremor vs parkinsons. Will continue to monitor. No other sxs at this time such as shuffling gait,  speech changes, handwriting changes, dementia, falls, etc.    Hyperlipidemia-tolerating her statin well. Due for lipid panel. Lab slip given today.

## 2012-08-01 ENCOUNTER — Other Ambulatory Visit: Payer: Self-pay | Admitting: *Deleted

## 2012-08-01 ENCOUNTER — Telehealth: Payer: Self-pay | Admitting: *Deleted

## 2012-08-01 DIAGNOSIS — R251 Tremor, unspecified: Secondary | ICD-10-CM

## 2012-08-01 LAB — VITAMIN B12: Vitamin B-12: 410 pg/mL (ref 211–911)

## 2012-08-01 NOTE — Telephone Encounter (Signed)
Order for b12 faxed to lab.Loralee Pacas [place]

## 2013-01-08 ENCOUNTER — Ambulatory Visit (INDEPENDENT_AMBULATORY_CARE_PROVIDER_SITE_OTHER): Payer: Medicare Other | Admitting: Family Medicine

## 2013-01-08 ENCOUNTER — Encounter: Payer: Self-pay | Admitting: Family Medicine

## 2013-01-08 VITALS — BP 141/86 | HR 95 | Wt 225.0 lb

## 2013-01-08 DIAGNOSIS — Z23 Encounter for immunization: Secondary | ICD-10-CM

## 2013-01-08 DIAGNOSIS — E785 Hyperlipidemia, unspecified: Secondary | ICD-10-CM

## 2013-01-08 DIAGNOSIS — R7301 Impaired fasting glucose: Secondary | ICD-10-CM

## 2013-01-08 DIAGNOSIS — G252 Other specified forms of tremor: Secondary | ICD-10-CM

## 2013-01-08 DIAGNOSIS — R251 Tremor, unspecified: Secondary | ICD-10-CM

## 2013-01-08 DIAGNOSIS — R259 Unspecified abnormal involuntary movements: Secondary | ICD-10-CM

## 2013-01-08 LAB — COMPLETE METABOLIC PANEL WITH GFR
ALT: 18 U/L (ref 0–35)
AST: 19 U/L (ref 0–37)
Albumin: 4.5 g/dL (ref 3.5–5.2)
Alkaline Phosphatase: 67 U/L (ref 39–117)
BUN: 17 mg/dL (ref 6–23)
CO2: 22 mEq/L (ref 19–32)
Calcium: 9.5 mg/dL (ref 8.4–10.5)
Chloride: 103 mEq/L (ref 96–112)
Creat: 0.79 mg/dL (ref 0.50–1.10)
GFR, Est African American: >89 mL/min
GFR, Est Non African American: 78 mL/min
Glucose, Bld: 116 mg/dL — ABNORMAL HIGH (ref 70–99)
Potassium: 4.2 mEq/L (ref 3.5–5.3)
Sodium: 141 mEq/L (ref 135–145)
Total Bilirubin: 0.6 mg/dL (ref 0.3–1.2)
Total Protein: 7.2 g/dL (ref 6.0–8.3)

## 2013-01-08 LAB — VITAMIN B12: Vitamin B-12: 356 pg/mL (ref 211–911)

## 2013-01-08 LAB — TSH: TSH: 1.685 u[IU]/mL (ref 0.350–4.500)

## 2013-01-08 LAB — POCT GLYCOSYLATED HEMOGLOBIN (HGB A1C): Hemoglobin A1C: 6.2

## 2013-01-08 MED ORDER — PRAVASTATIN SODIUM 80 MG PO TABS: 80.0000 mg | ORAL_TABLET | Freq: Every day | ORAL | Status: DC

## 2013-01-08 MED ORDER — AMBULATORY NON FORMULARY MEDICATION: Status: DC

## 2013-01-08 NOTE — Progress Notes (Signed)
Subjective:    Patient ID: Paige Moore, female    DOB: March 19, 1945, 67 y.o.   MRN: [id number]  HPI IFG -  No inc thirst or urination.   Lab Results  Component Value Date   HGBA1C 6.3 07/03/2012    Hyperlipidemia-No myalgia or S.E.  tolerating pravastatin well. Lab Results  Component Value Date   CHOL 124 07/03/2012   HDL 49 07/03/2012   LDLCALC 50 07/03/2012   LDLDIRECT 112* 10/24/2007   TRIG 125 07/03/2012   CHOLHDL 2.5 07/03/2012   Tremor for about a year in the right hand.  She says it is embarrassing. She is right handed.  Says not weakness. No intefering w/ hand writing.  She's able to drink a cup without any palms and knee without any pounds. She denies any family history of a tremor. She's not drinking alcohol. He does not affect the left side of her body. It does not affect the right leg or her head either. She denies any changes in mentation or dementia. She feels like it started abruptly maybe a year ago. She only takes a statin and no other medications.  . Review of Systems BP 141/86  Pulse 95  Wt 225 lb (102.059 kg)  BMI 38.6 kg/m2  SpO2 98%    No Known Allergies  Past Medical History  Diagnosis Date  . DJD (degenerative joint disease) of knee   . DJD (degenerative joint disease) of hip   . IFG (impaired fasting glucose)   . Obesity   . Hyperlipidemia   . S/P left knee arthroscopy     Past Surgical History  Procedure Laterality Date  . Ankle fracture surgery      right  . Cesarean section      X 2  . Total abdominal hysterectomy    . Joint replacement      L hip replacement, [facility] 09-2009  . Joint replacement  07/11/2010    L TKR Dr Almyra Free ([facility])    History   Social History  . Marital Status: Married    Spouse Name: N/A    Number of Children: 2  . Years of Education: N/A   Occupational History  . works part time      c [employer].    Social History Main Topics  . Smoking status: Former Smoker -- 15 years    Types: Cigarettes    Quit date:  03/15/2006  . Smokeless tobacco: Not on file  . Alcohol Use: No  . Drug Use: No  . Sexual Activity: Yes   Other Topics Concern  . Not on file   Social History Narrative   Some walking for exercise.     Family History  Problem Relation Age of Onset  . Osteoporosis Mother   . Hypothyroidism Mother   . Diabetes Father     Outpatient Encounter Prescriptions as of 01/08/2013  Medication Sig Dispense Refill  . acetaminophen (TYLENOL) 500 MG tablet Take by mouth. 1-2 tabs po tid prn       . AMBULATORY NON FORMULARY MEDICATION Medication Name: Accu-Chek Aviva test strips and lancets.  100 Units  11  . pravastatin (PRAVACHOL) 80 MG tablet Take 1 tablet (80 mg total) by mouth at bedtime.  90 tablet  3  . [DISCONTINUED] AMBULATORY NON FORMULARY MEDICATION Medication Name: shingles IM x 1.  1 vial  0  . [DISCONTINUED] fluconazole (DIFLUCAN) 150 MG tablet Take 1 tablet (150 mg total) by mouth daily.  3  tablet  0  . [DISCONTINUED] pravastatin (PRAVACHOL) 80 MG tablet Take 1 tablet (80 mg total) by mouth at bedtime.  90 tablet  3   No facility-administered encounter medications on file as of 01/08/2013.          Objective:   Physical Exam  Constitutional: She is oriented to person, place, and time. She appears well-developed and well-nourished.  HENT:  Head: Normocephalic and atraumatic.  Neck: Neck supple. No thyromegaly present.  Cardiovascular: Normal rate, regular rhythm and normal heart sounds.   Pulmonary/Chest: Effort normal and breath sounds normal.  Lymphadenopathy:    She has no cervical adenopathy.  Neurological: She is alert and oriented to person, place, and time. She has normal reflexes. Coordination normal.  She has a resting tremor in her right hand. I missed a pill rolling type tremor. Does not affect any other limb. It does not affect her head or neck. She has normal finger to nose test with no increase in tremor with intention. She's able to hold her hands up and  there is no sign of tremor or asterixis. Strength is good in the hand and arm. No weakness with grip. No cogwheeling on exam.  Skin: Skin is warm and dry.  Psychiatric: She has a normal mood and affect. Her behavior is normal.          Assessment & Plan:  IFG - Well controlled today , followup in 6 months. Continue to monitor her regularly for any advancement of diabetes. Continue work on diet and exercise. Lab Results  Component Value Date   HGBA1C 6.2 01/08/2013    Hyperlipidemia- well controlled. Continue current regimen. Repeat lipids and liver enzymes in April.  Tremor, resting - consider essential tremor versus possibly early Parkinson's disease. No other symptoms such as cogwheeling or changes in her hand writing, gait etc. as often seen with Parkinson's disease. We'll do some additional blood work to rule out thyroid problems, B12 and liver issues. We'll follow up in one month to discuss and evaluate further. No family history of tremor.

## 2013-01-10 LAB — CERULOPLASMIN: Ceruloplasmin: 34 mg/dL (ref 20–60)

## 2013-02-12 ENCOUNTER — Encounter: Payer: Self-pay | Admitting: Family Medicine

## 2013-02-12 ENCOUNTER — Ambulatory Visit (INDEPENDENT_AMBULATORY_CARE_PROVIDER_SITE_OTHER): Payer: Medicare Other | Admitting: Family Medicine

## 2013-02-12 VITALS — BP 108/74 | HR 87 | Temp 97.2°F | Wt 224.0 lb

## 2013-02-12 DIAGNOSIS — R251 Tremor, unspecified: Secondary | ICD-10-CM

## 2013-02-12 DIAGNOSIS — R259 Unspecified abnormal involuntary movements: Secondary | ICD-10-CM

## 2013-02-12 MED ORDER — PROPRANOLOL HCL 10 MG PO TABS: 5.0000 mg | ORAL_TABLET | Freq: Two times a day (BID) | ORAL | Status: DC | PRN

## 2013-02-12 NOTE — Progress Notes (Signed)
   Subjective:    Patient ID: Paige Moore, female    DOB: May 12, 1945, 67 y.o.   MRN: [id number]  HPI Tremor on the right hand for about a year.  Not noticed any difference with caffeine, alcohol or stress, etc. Not on anyt SSRI, etc. No family history of tremor. She is right handed.  Tremor is at rest.  Maybe better with position change. Not worse with movement or picking things up. No problems eating. Not sure if any changes with in her hand witting. Says hasn't really noticed.  Says the hand feels tired at times.  She is very unhappy with the cosmetic appearance of the tremor. She's very embarrassed by it.   Review of Systems     Objective:   Physical Exam  Constitutional: She is oriented to person, place, and time. She appears well-developed and well-nourished.  HENT:  Head: Normocephalic and atraumatic.  Musculoskeletal:  She does appear to have a pill rolling resting tremor.  It seems to actually be better with movement. When she changes position and the hand is in to rest but again she starts to have the tremor and again. It is located solely in the right hand. It seems to affect the thumb and first and second fingers predominantly. Normal finger to nose. With hands outstretched she has no significant active tremor. Strength in the right hand and arm is normal.  Neurological: She is alert and oriented to person, place, and time.  Skin: Skin is warm and dry.  Psychiatric: She has a normal mood and affect. Her behavior is normal.          Assessment & Plan:  Tremor-most consistent with Parkinson-like tremor. Discussed that typically for Parkinson or Parkinson-like tremor I typically refer to neurology for further evaluation and to discuss treatment options. We did do blood work last time which was all normal. Consider essential tremor as well but appears to be more of a resting tremor. She would like to try propranolol for a short period time just to see if it helps relieve her  symptoms. I think this is reasonable. We'll lower blood pressure so monitor for any lightheadedness or dizziness. Encouraged her to followup in one to 2 months. It's not helpful at that time then we certainly could consider a dopaminergic agent to treat Parkinson's. We could certainly try this for couple weeks to see if she has a response.

## 2013-04-13 ENCOUNTER — Ambulatory Visit (INDEPENDENT_AMBULATORY_CARE_PROVIDER_SITE_OTHER): Payer: Medicare Other | Admitting: Family Medicine

## 2013-04-13 ENCOUNTER — Encounter: Payer: Self-pay | Admitting: Family Medicine

## 2013-04-13 VITALS — BP 126/90 | HR 87 | Wt 223.0 lb

## 2013-04-13 DIAGNOSIS — G252 Other specified forms of tremor: Secondary | ICD-10-CM

## 2013-04-13 DIAGNOSIS — R7301 Impaired fasting glucose: Secondary | ICD-10-CM

## 2013-04-13 DIAGNOSIS — R259 Unspecified abnormal involuntary movements: Secondary | ICD-10-CM

## 2013-04-13 LAB — POCT GLYCOSYLATED HEMOGLOBIN (HGB A1C): Hemoglobin A1C: 6

## 2013-04-13 MED ORDER — AZITHROMYCIN 250 MG PO TABS: ORAL_TABLET | ORAL | Status: DC

## 2013-04-13 MED ORDER — PROPRANOLOL HCL 10 MG PO TABS: 10.0000 mg | ORAL_TABLET | Freq: Two times a day (BID) | ORAL | Status: DC | PRN

## 2013-04-13 NOTE — Progress Notes (Signed)
   Subjective:    Patient ID: Paige Moore, female    DOB: Oct 21, 1945, 68 y.o.   MRN: [id number]  HPI IFG -she has been walking for exercise at [employer] regularly. Says she has felt better overall. Down 1 lb since last here.  No increased thirst or urination.  Tremor - She is very happy with the propranolol. She says she's been able to hold magazines a new papers etc. without a significant tremor. She feels like cosmetically this has provided significant relief for her for in controlling her tremor.  She feels like taking a whole tab twice a day is more affected than a half a tab. She is tolerated it well.  She denies any lightheadedness dizziness on the medication.  Review of Systems     Objective:   Physical Exam  Constitutional: She is oriented to person, place, and time. She appears well-developed and well-nourished.  HENT:  Head: Normocephalic and atraumatic.  Cardiovascular: Normal rate, regular rhythm and normal heart sounds.   Pulmonary/Chest: Effort normal and breath sounds normal.  Musculoskeletal:  Resting tremor on the right hand.   Neurological: She is alert and oriented to person, place, and time.  Skin: Skin is warm and dry.  Psychiatric: She has a normal mood and affect. Her behavior is normal.          Assessment & Plan:  IFG - A1C is 6.0.  Stable. Continue to work on diet and exercise.  Followup in 6 months.  Tremor - I still think her tremor could be early Parkinson's disease. But right now she's getting symptomatic relief with the propranolol. She feels like taking a whole tab twice a day is more affected than a half a tab. She is tolerated it well. Adjust dose to 1 whole tab twice a day. New perception since today. We can certainly continue to monitor this over the next year and if at some point she is not doing well we can consider a trial of dopamine agonist.

## 2013-04-16 ENCOUNTER — Ambulatory Visit: Payer: Medicare Other | Admitting: Family Medicine

## 2013-08-13 ENCOUNTER — Encounter: Payer: Self-pay | Admitting: Family Medicine

## 2013-08-13 ENCOUNTER — Ambulatory Visit (INDEPENDENT_AMBULATORY_CARE_PROVIDER_SITE_OTHER): Payer: Medicare Other | Admitting: Family Medicine

## 2013-08-13 VITALS — BP 115/75 | HR 83 | Wt 219.0 lb

## 2013-08-13 DIAGNOSIS — Z780 Asymptomatic menopausal state: Secondary | ICD-10-CM

## 2013-08-13 DIAGNOSIS — N9089 Other specified noninflammatory disorders of vulva and perineum: Secondary | ICD-10-CM

## 2013-08-13 DIAGNOSIS — R7301 Impaired fasting glucose: Secondary | ICD-10-CM

## 2013-08-13 DIAGNOSIS — E785 Hyperlipidemia, unspecified: Secondary | ICD-10-CM

## 2013-08-13 LAB — POCT GLYCOSYLATED HEMOGLOBIN (HGB A1C): Hemoglobin A1C: 6.2

## 2013-08-13 NOTE — Progress Notes (Signed)
   Subjective:    Patient ID: Paige Moore, female    DOB: [birth year], 68 y.o.   MRN: [id number]  HPI Impaired fasting glucose-no inc thirst or urination.  Due for 6 mo repeat A1C.  Hyperlipidemia-due to repeat lipids today. On pravastatin 80 mg tolerating it well without any myalgias or side effects.  Walking for exercise.    Also complains of a vaginal lesion that has been present for about 2-3 months. She says that occasionally itchy. It hasn't gone away on his own. She has tried some over-the-counter anti-itch creams with no success or relief. No prior history of STDs.   Review of Systems     Objective:   Physical Exam  Constitutional: She is oriented to person, place, and time. She appears well-developed and well-nourished.  HENT:  Head: Normocephalic and atraumatic.  Cardiovascular: Normal rate, regular rhythm and normal heart sounds.   Pulmonary/Chest: Effort normal and breath sounds normal.  Genitourinary:     Approximately 3 and half centimeter white raised lesion. There is an area of this in the center that appears to be more erythematous and I must also light. Clear margins but irregular borders  Neurological: She is alert and oriented to person, place, and time.  Skin: Skin is warm and dry.  Psychiatric: She has a normal mood and affect. Her behavior is normal.          Assessment & Plan:  Impaired fasting glucose- up a little compared to last time.  Discussed importance of working on diet and exercise. A1c back down. If it is up to 6.9 when he did start medication we discussed this today. Followup in 6 months. Lab Results  Component Value Date   HGBA1C 6.2 08/13/2013    Hyperlipidemia-due to repeat lipids. Tolerating statin well. Recheck liver enzymes as well.  Vaginal lesion.-Lesion is worrisome for her lichen sclerosus versus squamous cell cancer. I did do a swab to check for herpes simplex today. Will likely be negative. Will refer to OB/GYN for biopsy and  further evaluation.

## 2013-08-14 LAB — LIPID PANEL
Cholesterol: 138 mg/dL (ref 0–200)
HDL: 44 mg/dL (ref >39)
LDL Cholesterol: 62 mg/dL (ref 0–99)
Total CHOL/HDL Ratio: 3.1 Ratio
Triglycerides: 158 mg/dL — ABNORMAL HIGH (ref <150)
VLDL: 32 mg/dL (ref 0–40)

## 2013-08-14 LAB — COMPLETE METABOLIC PANEL WITH GFR
ALT: 19 U/L (ref 0–35)
AST: 17 U/L (ref 0–37)
Albumin: 4.5 g/dL (ref 3.5–5.2)
Alkaline Phosphatase: 62 U/L (ref 39–117)
BUN: 18 mg/dL (ref 6–23)
CO2: 25 mEq/L (ref 19–32)
Calcium: 9.5 mg/dL (ref 8.4–10.5)
Chloride: 105 mEq/L (ref 96–112)
Creat: 0.74 mg/dL (ref 0.50–1.10)
GFR, Est African American: >89 mL/min
GFR, Est Non African American: 84 mL/min
Glucose, Bld: 101 mg/dL — ABNORMAL HIGH (ref 70–99)
Potassium: 4.1 mEq/L (ref 3.5–5.3)
Sodium: 141 mEq/L (ref 135–145)
Total Bilirubin: 0.7 mg/dL (ref 0.2–1.2)
Total Protein: 7.1 g/dL (ref 6.0–8.3)

## 2013-08-15 LAB — HERPES SIMPLEX VIRUS CULTURE: Organism ID, Bacteria: NOT DETECTED

## 2013-08-21 ENCOUNTER — Ambulatory Visit (INDEPENDENT_AMBULATORY_CARE_PROVIDER_SITE_OTHER): Payer: Medicare Other

## 2013-08-21 DIAGNOSIS — Z780 Asymptomatic menopausal state: Secondary | ICD-10-CM

## 2013-08-21 DIAGNOSIS — Z13820 Encounter for screening for osteoporosis: Secondary | ICD-10-CM

## 2013-09-04 ENCOUNTER — Encounter: Payer: Self-pay | Admitting: Obstetrics & Gynecology

## 2013-09-04 ENCOUNTER — Ambulatory Visit (INDEPENDENT_AMBULATORY_CARE_PROVIDER_SITE_OTHER): Payer: Medicare Other | Admitting: Obstetrics & Gynecology

## 2013-09-04 VITALS — BP 140/82 | HR 82 | Resp 16 | Ht 63.5 in | Wt 220.0 lb

## 2013-09-04 DIAGNOSIS — N9089 Other specified noninflammatory disorders of vulva and perineum: Secondary | ICD-10-CM

## 2013-09-04 DIAGNOSIS — B009 Herpesviral infection, unspecified: Secondary | ICD-10-CM

## 2013-09-04 MED ORDER — VALACYCLOVIR HCL 1 G PO TABS: 1000.0000 mg | ORAL_TABLET | Freq: Two times a day (BID) | ORAL | Status: AC

## 2013-09-04 NOTE — Progress Notes (Signed)
   Subjective:    Patient ID: Paige Moore, female    DOB: [birth year], 68 y.o.   MRN: [id number]  HPI This lovely 68 yo lady had 2 concerns. She has been having lots of left vulvar itching for several months. She does not do SVEs. Her other complaint is that of at least a month of itching on her left buttock. She says that the itching is followed by sores.   Review of Systems     Objective:   Physical Exam HSV type lesions of left buttock  2 1/2 cm white lesion with ulcerations of her left vulva (on the lower inner part of the labia majora)  I prepped the area with betadine and infiltrated 2 areas with 1% lidocaine. I did a punch biopsy at the top portion and at the bottom portion. I used silver nitrate to achieve hemostasis. She tolerated the procedure well.       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 Await vulvar biopsy pathology

## 2013-09-05 LAB — HSV 2 ANTIBODY, IGG: HSV 2 Glycoprotein G Ab, IgG: 12.97 IV — ABNORMAL HIGH

## 2013-09-10 ENCOUNTER — Telehealth: Payer: Self-pay | Admitting: *Deleted

## 2013-09-10 NOTE — Telephone Encounter (Signed)
Pt aware of biopsy results and appt to be made with gyn/onc.  Dr Everitt Amber  09/24/13  Arrive @ 1:15 for a 1:45 appt.

## 2013-09-12 ENCOUNTER — Ambulatory Visit (INDEPENDENT_AMBULATORY_CARE_PROVIDER_SITE_OTHER): Payer: Medicare Other | Admitting: Obstetrics & Gynecology

## 2013-09-12 ENCOUNTER — Encounter: Payer: Self-pay | Admitting: Obstetrics & Gynecology

## 2013-09-12 VITALS — BP 120/82 | HR 16 | Resp 16 | Ht 63.5 in | Wt 220.0 lb

## 2013-09-12 DIAGNOSIS — D071 Carcinoma in situ of vulva: Secondary | ICD-10-CM

## 2013-09-12 NOTE — Progress Notes (Signed)
Pt presents s/p biopsy to review results.  VIN 3 warty type.  Pt has gyn onc appt on July 13th.  (Pt's sister had endometrial cancer and Dr. Skeet Latch did her surgery).   Pt had 2 family members present.  Did not tell patient her HSV 2 titers were positive. (viral culture was negative).

## 2013-09-24 ENCOUNTER — Encounter: Payer: Self-pay | Admitting: Gynecologic Oncology

## 2013-09-24 ENCOUNTER — Ambulatory Visit: Payer: Medicare Other | Attending: Gynecologic Oncology | Admitting: Gynecologic Oncology

## 2013-09-24 VITALS — BP 122/87 | HR 87 | Temp 97.6°F | Resp 18 | Ht 63.5 in | Wt 219.5 lb

## 2013-09-24 DIAGNOSIS — Z96649 Presence of unspecified artificial hip joint: Secondary | ICD-10-CM | POA: Diagnosis not present

## 2013-09-24 DIAGNOSIS — Z90710 Acquired absence of both cervix and uterus: Secondary | ICD-10-CM | POA: Diagnosis not present

## 2013-09-24 DIAGNOSIS — D071 Carcinoma in situ of vulva: Secondary | ICD-10-CM | POA: Diagnosis not present

## 2013-09-24 NOTE — Patient Instructions (Signed)
Vulvectomy, Care After The vulva is the external female genitalia, outside and around the vagina and pubic bone. It consists of:  The skin on, and in front of, the pubic bone.  The clitoris.  The labia majora (large lips) on the outside of the vagina.  The labia minora (small lips) around the opening of the vagina.  The opening and the skin in and around the vagina. A vulvectomy is the removal of the tissue of the vulva, which sometimes includes removal of the lymph nodes and tissue in the groin areas. These discharge instructions provide you with general information on caring for yourself after you leave the hospital. It is also important that you know the warning signs of complications, so that you can seek treatment. Please read the instructions outlined below and refer to this sheet in the next few weeks. Your caregiver may also give you specific information and medicines. If you have any questions or complications after discharge, please call your caregiver. ACTIVITY  Rest as much as possible the first two weeks after discharge.  Arrange to have help from family or others with your daily activities when you go home.  Avoid heavy lifting (more than 5 pounds), pushing, or pulling.  If you feel tired, balance your activity with rest periods.  Follow your caregiver's instruction about climbing stairs and driving a car.  Increase activity gradually.  Do not exercise until you have permission from your caregiver. LEG AND FOOT CARE If your doctor has removed lymph nodes from your groin area, there may be an increase in swelling of your legs and feet. You can help prevent swelling by doing the following:  Elevate your legs while sitting or lying down.  If your caregiver has ordered special stockings, wear them according to instructions.  Avoid standing in one place for long periods of time.  Call the physical therapy department if you have any questions about swelling or treatment  for swelling.  Avoid salt in your diet. It can cause fluid retention and swelling.  Do not cross your legs, especially when sitting. NUTRITION  You may resume your normal diet.  Drink 6 to 8 glasses of fluids a day.  Eat a healthy, balanced diet including portions of food from the meat (protein), milk, fruit, vegetable, and bread groups.  Your caregiver may recommend you take a multivitamin with iron. ELIMINATION  You may notice that your stream of urine is at a different angle, and may tend to spray. Using a plastic funnel may help to decrease urine spray.  If constipation occurs, drink more liquids, and add more fruits, vegetables, and bran to your diet. You may take a mild laxative, such as Milk of Magnesia, Metamucil or a stool softener, such as Colace with permission from your caregiver. HYGIENE  You may shower and wash your hair.  Check with your caregiver about tub baths.  Do not add any bath oils or chemicals to your bath water, after you have permission to take baths.  Clean yourself well after moving your bowels.  After urinating, wipe from top to bottom.  A sitz bath will help keep your perineal area clean, reduce swelling, and provide comfort. HOME CARE INSTRUCTIONS   Take your temperature twice a day and record it, especially if you feel feverish or have chills.  Follow your caregiver's instructions about medicines, activity, and follow-up appointments after surgery.  Do not drink alcohol while taking pain medicine.  Change your dressing as advised by your caregiver.  You may take over-the-counter medicine for pain, recommended by your caregiver.  If your pain is not relieved with medicine, call your caregiver.  Do not take aspirin, because it can cause bleeding.  Do not douche or use tampons (use a non-perfumed sanitary pad).  Do not have sexual intercourse until your caregiver gives you permission. Hugging, kissing, and playful sexual activity is  fine with your caregiver's permission.  Warm sitz baths, with your caregiver's permission, are helpful to control swelling and discomfort.  Take showers instead of baths, until your caregiver gives you permission to take baths.  You may take a mild medicine for constipation, recommended by your caregiver. Bran foods and drinking a lot of fluids will help with constipation.  Make sure your family understands everything about your operation and recovery. SEEK MEDICAL CARE IF:   You notice swelling and redness around the wound area.  You notice a foul smell coming from the wound or on the surgical dressing.  You notice the wound is separating.  You have painful or bloody urination.  You develop nausea and vomiting.  You develop diarrhea.  You develop a rash.  You have a reaction or allergy from the medicine.  You feel dizzy or light headed.  You need stronger pain medicine. SEEK IMMEDIATE MEDICAL CARE IF:   You develop a temperature of 102 F (38.9 C) or higher.  You pass out.  You develop leg or chest pain.  You develop abdominal pain.  You develop shortness of breath.  You develop bleeding from the wound area.  You see pus in the wound area. MAKE SURE YOU:   Understand these instructions.  Will watch your condition.  Will get help right away if you are not doing well or get worse. Document Released: 10/14/2003 Document Revised: 12/20/2012 Document Reviewed: 01/31/2009 [facility] Patient Information 2015 [place], [place]. This information is not intended to replace advice given to you by your health care provider. Make sure you discuss any questions you have with your health care provider.

## 2013-09-24 NOTE — Progress Notes (Signed)
Consult Note: Gyn-Onc  Consult was requested by Dr. 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 Vulvar Cancer    Assessment/Plan:  Paige Moore  is a 68 y.o.  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 Hulan Fray.  Paige Moore has a well-circumscribed 3 cm lesion at the left mid labia minora and vaginal introitus. I feel it is most amenable to a wide local excision. I favor this over laser in this case because a. I would like to ensure that pathology from lesion is obtained to ensure there in no foci of invasive carcinoma and b. It is a singular lesion of a small size which is amenable to excision and primary reapproximation.  I described to the patient the operative process including the goal of negative margins, and primary closure. I discussed that there is a risk for wound separation postoperatively, but if that occurs typically the wound is managed conservatively and healing by primary intention. I discussed that with her history of prediabetes she is at an increased risk for wound separation or wound infection and that optimization of her blood glucose preoperatively and perioperatively is ideal. We discussed that this is a procedure that can be performed outpatient. I discussed postoperative vulva wound care with the patient and anticipated postoperative discomfort. I discussed the high risk for recurrence of these lesions (25%) and the need for additional procedures in the future for recurrent dysplastic lesions. I also discussed that this lesion raises her risk for a future invasive vulvar cancer and that she will require close follow up with Dr. Hulan Fray postoperatively.   HPI: Paige Moore is a 68 year old woman who is seen in consultation at the request of Dr. Hulan Fray for VIN 3. The patient reports a 3 month history of vulvar pruritus, and intermittent bleeding. She denies  urinary incontinence, history of abnormal Pap smears, or history of lichen sclerosis. She is a former smoker. She saw her at Dr. Hulan Fray for evaluation on 09/04/2013 and a punch biopsy was performed of the left vulvar and the pathology revealed vulvar intraepithelial neoplasia 3, warty type.   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    Current Meds:  Outpatient Encounter Prescriptions as of 09/24/2013  Medication Sig  . valACYclovir (VALTREX) 1000 MG tablet Take 1,000 mg by mouth daily.  Marland Kitchen acetaminophen (TYLENOL) 500 MG tablet Take by mouth. 1-2 tabs po tid prn   . AMBULATORY NON FORMULARY MEDICATION Medication Name: Accu-Chek Aviva test strips and lancets.  Marland Kitchen ibuprofen (ADVIL,MOTRIN) 200 MG tablet Take 200 mg by mouth every 6 (six) hours as needed.  . pravastatin (PRAVACHOL) 80 MG tablet Take 1 tablet (80 mg total) by mouth at bedtime.  . propranolol (INDERAL) 10 MG tablet Take 1 tablet (10 mg total) by mouth 2 (two) times daily as needed.    Allergy: No Known Allergies  Social Hx:   History   Social History  . Marital Status: Married    Spouse Name: N/A    Number of Children: 2  . Years of Education: N/A   Occupational History  . works part time      [employer].    Social History Main Topics  . Smoking status: Former Smoker -- 15 years    Types: Cigarettes    Quit date: 03/15/2006  .  Smokeless tobacco: Not on file  . Alcohol Use: No  . Drug Use: No  . Sexual Activity: Yes   Other Topics Concern  . Not on file   Social History Narrative   Some walking for exercise.     Past Surgical Hx:  Past Surgical History  Procedure Laterality Date  . Ankle fracture surgery      right  . Cesarean section      X 2  . Total abdominal hysterectomy    . Joint replacement      L hip replacement, [facility] 09-2009  . Joint replacement  07/11/2010    L TKR Dr Enis Slipper [facility])    Past Medical Hx:  Past Medical History  Diagnosis Date  . DJD  (degenerative joint disease) of knee   . DJD (degenerative joint disease) of hip   . IFG (impaired fasting glucose)   . Obesity   . Hyperlipidemia   . S/P left knee arthroscopy     Past Gynecological History:  LMP at age 24 at time of hysterectomy for bleeding (ovaries remained, and cervix). No hx of abnormal pap (patient believes last pap was in the last 3 years). No postmenopausal bleeding. Hx of c/s x 2.  No LMP recorded. Patient has had a hysterectomy.  Family Hx:  Family History  Problem Relation Age of Onset  . Osteoporosis Mother   . Hypothyroidism Mother   . Diabetes Father     Review of Systems:  Constitutional  Feels well,    ENT Normal appearing ears and nares bilaterally Skin/Breast  No rash, sores, jaundice, itching, dryness Cardiovascular  No chest pain, shortness of breath, or edema  Pulmonary  No cough or wheeze.  Gastro Intestinal  No nausea, vomitting, or diarrhoea. No bright red blood per rectum, no abdominal pain, change in bowel movement, or constipation.  Genito Urinary  No frequency, urgency, dysuria, + vulvar prutitis Musculo Skeletal  No myalgia, arthralgia, joint swelling or pain  Neurologic  No weakness, numbness, change in gait,  Psychology  No depression, anxiety, insomnia.   Vitals:  Blood pressure 122/87, pulse 87, temperature 97.6 F (36.4 C), temperature source Oral, resp. rate 18, height 5' 3.5" (1.613 m), weight 219 lb 8 oz (99.565 kg).  Physical Exam: WD in NAD Neck  Supple NROM, without any enlargements.  Lymph Node Survey No cervical supraclavicular or inguinal adenopathy Cardiovascular  Pulse normal rate, regularity and rhythm. S1 and S2 normal.  Lungs  Clear to auscultation bilateraly, without wheezes/crackles/rhonchi. Good air movement.  Skin  No rash/lesions/breakdown  Psychiatry  Alert and oriented to person, place, and time  Abdomen  Normoactive bowel sounds, abdomen soft, non-tender and obese without evidence of  hernia.  Back No CVA tenderness Genito Urinary  Vulva/vagina: 2.5cm lesion on the left labia minora/vaginal introitus. 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 No bleeding. Surrounding skin normal. Agglutination (physiologic) of labia minora. No lesions. No discharge or bleeding.  Bladder/urethra:  No lesions or masses, well supported bladder  Vagina: normal, atrophic appearing  Cervix: Normal appearing, no lesions.  Uterus: surgically absent  Adnexa: no masses. Rectal  deferred Extremities  No bilateral cyanosis, clubbing or edema.   @[facility] @ 09/24/2013, 2:42 PM

## 2013-09-26 ENCOUNTER — Telehealth: Payer: Self-pay | Admitting: *Deleted

## 2013-09-26 NOTE — Telephone Encounter (Signed)
Called pt to discuss surgery for Jul 20th or Jul 30th.  LMOVM for pt to call back by asap if she would like to have procedure done on Jul 20th.

## 2013-10-05 ENCOUNTER — Encounter (HOSPITAL_BASED_OUTPATIENT_CLINIC_OR_DEPARTMENT_OTHER): Payer: Self-pay | Admitting: *Deleted

## 2013-10-08 ENCOUNTER — Encounter (HOSPITAL_BASED_OUTPATIENT_CLINIC_OR_DEPARTMENT_OTHER): Payer: Self-pay | Admitting: *Deleted

## 2013-10-08 NOTE — Progress Notes (Signed)
NPO AFTER MN.  ARRIVE AT 0600.  NEEDS ISTAT AND EKG.  

## 2013-10-11 ENCOUNTER — Encounter (HOSPITAL_BASED_OUTPATIENT_CLINIC_OR_DEPARTMENT_OTHER): Payer: Medicare Other | Admitting: Anesthesiology

## 2013-10-11 ENCOUNTER — Ambulatory Visit (HOSPITAL_BASED_OUTPATIENT_CLINIC_OR_DEPARTMENT_OTHER): Payer: Medicare Other | Admitting: Anesthesiology

## 2013-10-11 ENCOUNTER — Encounter (HOSPITAL_BASED_OUTPATIENT_CLINIC_OR_DEPARTMENT_OTHER): Payer: Self-pay | Admitting: Anesthesiology

## 2013-10-11 ENCOUNTER — Ambulatory Visit (HOSPITAL_BASED_OUTPATIENT_CLINIC_OR_DEPARTMENT_OTHER)
Admission: RE | Admit: 2013-10-11 | Discharge: 2013-10-11 | Disposition: A | Discharge status: Home / Self Care | Payer: Medicare Other | Source: Ambulatory Visit | Attending: Gynecologic Oncology | Admitting: Gynecologic Oncology

## 2013-10-11 ENCOUNTER — Encounter (HOSPITAL_BASED_OUTPATIENT_CLINIC_OR_DEPARTMENT_OTHER)
Admission: RE | Disposition: A | Discharge status: Home / Self Care | Payer: Self-pay | Source: Ambulatory Visit | Attending: Gynecologic Oncology

## 2013-10-11 DIAGNOSIS — Z96659 Presence of unspecified artificial knee joint: Secondary | ICD-10-CM | POA: Insufficient documentation

## 2013-10-11 DIAGNOSIS — D071 Carcinoma in situ of vulva: Secondary | ICD-10-CM | POA: Insufficient documentation

## 2013-10-11 DIAGNOSIS — E785 Hyperlipidemia, unspecified: Secondary | ICD-10-CM | POA: Diagnosis not present

## 2013-10-11 DIAGNOSIS — Z6838 Body mass index (BMI) 38.0-38.9, adult: Secondary | ICD-10-CM | POA: Insufficient documentation

## 2013-10-11 DIAGNOSIS — Z90710 Acquired absence of both cervix and uterus: Secondary | ICD-10-CM | POA: Insufficient documentation

## 2013-10-11 DIAGNOSIS — Z79899 Other long term (current) drug therapy: Secondary | ICD-10-CM | POA: Insufficient documentation

## 2013-10-11 DIAGNOSIS — Z96649 Presence of unspecified artificial hip joint: Secondary | ICD-10-CM | POA: Insufficient documentation

## 2013-10-11 DIAGNOSIS — Z87891 Personal history of nicotine dependence: Secondary | ICD-10-CM | POA: Diagnosis not present

## 2013-10-11 HISTORY — DX: Unspecified osteoarthritis, unspecified site: M19.90

## 2013-10-11 HISTORY — DX: Presence of spectacles and contact lenses: Z97.3

## 2013-10-11 HISTORY — DX: Essential tremor: G25.0

## 2013-10-11 HISTORY — DX: Other specified postprocedural states: Z98.890

## 2013-10-11 HISTORY — DX: Prediabetes: R73.03

## 2013-10-11 HISTORY — DX: Carcinoma in situ of vulva: D07.1

## 2013-10-11 HISTORY — DX: Other specified postprocedural states: R11.2

## 2013-10-11 HISTORY — PX: VULVECTOMY: SHX1086

## 2013-10-11 HISTORY — DX: Presence of dental prosthetic device (complete) (partial): Z97.2

## 2013-10-11 LAB — POCT I-STAT 4, (NA,K, GLUC, HGB,HCT)
Glucose, Bld: 120 mg/dL — ABNORMAL HIGH (ref 70–99)
HCT: 43 % (ref 36.0–46.0)
Hemoglobin: 14.6 g/dL (ref 12.0–15.0)
Potassium: 3.7 mEq/L (ref 3.7–5.3)
Sodium: 142 mEq/L (ref 137–147)

## 2013-10-11 SURGERY — WIDE EXCISION VULVECTOMY
Anesthesia: General | Site: Vulva | Laterality: Left

## 2013-10-11 MED ORDER — LACTATED RINGERS IV SOLN
INTRAVENOUS | Status: DC
  Administered 2013-10-11: 07:00:00 via INTRAVENOUS
  Filled 2013-10-11: qty 1000

## 2013-10-11 MED ORDER — FENTANYL CITRATE 0.05 MG/ML IJ SOLN
INTRAMUSCULAR | Status: DC | PRN
  Administered 2013-10-11: 50 ug via INTRAVENOUS
  Administered 2013-10-11: 25 ug via INTRAVENOUS

## 2013-10-11 MED ORDER — LIDOCAINE HCL 1 % IJ SOLN
INTRAMUSCULAR | Status: DC | PRN
  Administered 2013-10-11: 15 mL

## 2013-10-11 MED ORDER — EPHEDRINE SULFATE 50 MG/ML IJ SOLN
INTRAMUSCULAR | Status: DC | PRN
  Administered 2013-10-11 (×2): 5 mg via INTRAVENOUS

## 2013-10-11 MED ORDER — DEXAMETHASONE SODIUM PHOSPHATE 4 MG/ML IJ SOLN
INTRAMUSCULAR | Status: DC | PRN
  Administered 2013-10-11: 8 mg via INTRAVENOUS

## 2013-10-11 MED ORDER — FENTANYL CITRATE 0.05 MG/ML IJ SOLN
25.0000 ug | INTRAMUSCULAR | Status: DC | PRN
  Filled 2013-10-11: qty 1

## 2013-10-11 MED ORDER — PROPOFOL 10 MG/ML IV BOLUS
INTRAVENOUS | Status: DC | PRN
  Administered 2013-10-11: 150 mg via INTRAVENOUS

## 2013-10-11 MED ORDER — FENTANYL CITRATE 0.05 MG/ML IJ SOLN
INTRAMUSCULAR | Status: AC
  Filled 2013-10-11: qty 4

## 2013-10-11 MED ORDER — ONDANSETRON HCL 4 MG/2ML IJ SOLN
INTRAMUSCULAR | Status: DC | PRN
  Administered 2013-10-11: 4 mg via INTRAVENOUS

## 2013-10-11 MED ORDER — KETOROLAC TROMETHAMINE 30 MG/ML IJ SOLN
INTRAMUSCULAR | Status: DC | PRN
Start: 2013-10-11 — End: 2013-10-11
  Administered 2013-10-11: 15 mg via INTRAVENOUS

## 2013-10-11 MED ORDER — MIDAZOLAM HCL 2 MG/2ML IJ SOLN
INTRAMUSCULAR | Status: AC
  Filled 2013-10-11: qty 2

## 2013-10-11 MED ORDER — METOCLOPRAMIDE HCL 5 MG/ML IJ SOLN
INTRAMUSCULAR | Status: DC | PRN
  Administered 2013-10-11: 10 mg via INTRAVENOUS

## 2013-10-11 MED ORDER — KETOROLAC TROMETHAMINE 30 MG/ML IJ SOLN
15.0000 mg | Freq: Once | INTRAMUSCULAR | Status: DC | PRN
  Filled 2013-10-11: qty 1

## 2013-10-11 MED ORDER — OXYCODONE-ACETAMINOPHEN 5-325 MG PO TABS: 1.0000 | ORAL_TABLET | Freq: Four times a day (QID) | ORAL | Status: DC | PRN

## 2013-10-11 MED ORDER — PROMETHAZINE HCL 25 MG/ML IJ SOLN
6.2500 mg | INTRAMUSCULAR | Status: DC | PRN
  Filled 2013-10-11: qty 1

## 2013-10-11 MED ORDER — ACETAMINOPHEN 10 MG/ML IV SOLN
INTRAVENOUS | Status: DC | PRN
  Administered 2013-10-11: 1000 mg via INTRAVENOUS

## 2013-10-11 MED ORDER — MIDAZOLAM HCL 5 MG/5ML IJ SOLN
INTRAMUSCULAR | Status: DC | PRN
  Administered 2013-10-11: 1 mg via INTRAVENOUS

## 2013-10-11 MED ORDER — DOCUSATE SODIUM 100 MG PO CAPS: 100.0000 mg | ORAL_CAPSULE | Freq: Two times a day (BID) | ORAL | Status: DC

## 2013-10-11 MED ORDER — ACETIC ACID 5 % SOLN
Status: DC | PRN
  Administered 2013-10-11: 1 via TOPICAL

## 2013-10-11 MED ORDER — LIDOCAINE HCL (CARDIAC) 20 MG/ML IV SOLN
INTRAVENOUS | Status: DC | PRN
  Administered 2013-10-11: 50 mg via INTRAVENOUS

## 2013-10-11 SURGICAL SUPPLY — 48 items
APPLICATOR COTTON TIP 6IN STRL (MISCELLANEOUS) IMPLANT
BLADE SURG 15 STRL LF DISP TIS (BLADE) ×1 IMPLANT
BLADE SURG 15 STRL SS (BLADE) ×2
CANISTER SUCTION 2500CC (MISCELLANEOUS) ×2 IMPLANT
CATH FOLEY 2WAY SLVR  5CC 14FR (CATHETERS)
CATH FOLEY 2WAY SLVR 5CC 14FR (CATHETERS) IMPLANT
CATH ROBINSON RED A/P 14FR (CATHETERS) IMPLANT
COVER TABLE BACK 60X90 (DRAPES) ×2 IMPLANT
DRAPE LG THREE QUARTER DISP (DRAPES) ×4 IMPLANT
DRAPE SURG IRRIG POUCH 19X23 (DRAPES) ×2 IMPLANT
DRAPE UNDERBUTTOCKS STRL (DRAPE) ×2 IMPLANT
GAUZE SPONGE 4X4 12PLY STRL (GAUZE/BANDAGES/DRESSINGS) IMPLANT
GAUZE SPONGE 4X4 16PLY XRAY LF (GAUZE/BANDAGES/DRESSINGS) IMPLANT
GLOVE BIO SURGEON STRL SZ 6.5 (GLOVE) IMPLANT
GLOVE BIO SURGEON STRL SZ7.5 (GLOVE) IMPLANT
GLOVE BIOGEL M STER SZ 6 (GLOVE) ×6 IMPLANT
GLOVE BIOGEL PI IND STRL 6.5 (GLOVE) IMPLANT
GLOVE BIOGEL PI IND STRL 7.0 (GLOVE) IMPLANT
GLOVE BIOGEL PI INDICATOR 6.5 (GLOVE)
GLOVE BIOGEL PI INDICATOR 7.0 (GLOVE)
GOWN STRL REUS W/TWL LRG LVL3 (GOWN DISPOSABLE) ×4 IMPLANT
LEGGING LITHOTOMY PAIR STRL (DRAPES) ×2 IMPLANT
NEEDLE HYPO 22GX1.5 SAFETY (NEEDLE) IMPLANT
NEEDLE HYPO 25X1 1.5 SAFETY (NEEDLE) ×2 IMPLANT
NS IRRIG 500ML POUR BTL (IV SOLUTION) ×2 IMPLANT
PACK BASIN DAY SURGERY FS (CUSTOM PROCEDURE TRAY) ×2 IMPLANT
PAD OB MATERNITY 4.3X12.25 (PERSONAL CARE ITEMS) ×2 IMPLANT
PENCIL BUTTON HOLSTER BLD 10FT (ELECTRODE) ×2 IMPLANT
SCOPETTES 8  STERILE (MISCELLANEOUS)
SCOPETTES 8 STERILE (MISCELLANEOUS) IMPLANT
SUT VIC AB 2-0 CT2 27 (SUTURE) IMPLANT
SUT VIC AB 2-0 SH 27 (SUTURE)
SUT VIC AB 2-0 SH 27X BRD (SUTURE) IMPLANT
SUT VIC AB 3-0 PS2 18 (SUTURE)
SUT VIC AB 3-0 PS2 18XBRD (SUTURE) IMPLANT
SUT VIC AB 3-0 SH 27 (SUTURE) ×3
SUT VIC AB 3-0 SH 27X BRD (SUTURE) ×3 IMPLANT
SUT VIC AB 4-0 PS2 18 (SUTURE) ×6 IMPLANT
SUT VICRYL 2 0 18  UND BR (SUTURE)
SUT VICRYL 2 0 18 UND BR (SUTURE) IMPLANT
SYR BULB IRRIGATION 50ML (SYRINGE) IMPLANT
SYRINGE 12CC LL (MISCELLANEOUS) ×2 IMPLANT
TOWEL OR 17X24 6PK STRL BLUE (TOWEL DISPOSABLE) ×4 IMPLANT
TRAY DSU PREP LF (CUSTOM PROCEDURE TRAY) ×2 IMPLANT
TUBE CONNECTING 12X1/4 (SUCTIONS) ×2 IMPLANT
UNDERPAD 30X30 INCONTINENT (UNDERPADS AND DIAPERS) ×2 IMPLANT
WATER STERILE IRR 500ML POUR (IV SOLUTION) ×2 IMPLANT
YANKAUER SUCT BULB TIP NO VENT (SUCTIONS) ×2 IMPLANT

## 2013-10-11 NOTE — Transfer of Care (Signed)
Immediate Anesthesia Transfer of Care Note  Patient: Paige Moore  Procedure(s) Performed: Procedure(s) (LRB): WIDE LOCAL EXCISON OF LEFT VULVA (Left)  Patient Location: PACU  Anesthesia Type: General  Level of Consciousness: awake, alert  and oriented  Airway & Oxygen Therapy: Patient Spontanous Breathing and Patient connected to nasal cannula oxygen  Post-op Assessment: Report given to PACU RN and Post -op Vital signs reviewed and stable  Post vital signs: Reviewed and stable  Complications: No apparent anesthesia complications

## 2013-10-11 NOTE — Anesthesia Postprocedure Evaluation (Signed)
  Anesthesia Post-op Note  Patient: Paige Moore  Procedure(s) Performed: Procedure(s) (LRB): WIDE LOCAL EXCISON OF LEFT VULVA (Left)  Patient Location: PACU  Anesthesia Type: General  Level of Consciousness: awake and alert   Airway and Oxygen Therapy: Patient Spontanous Breathing  Post-op Pain: mild  Post-op Assessment: Post-op Vital signs reviewed, Patient's Cardiovascular Status Stable, Respiratory Function Stable, Patent Airway and No signs of Nausea or vomiting  Last Vitals:  Filed Vitals:   10/11/13 0845  BP: 119/61  Pulse: 72  Temp:   Resp: 15    Post-op Vital Signs: stable   Complications: No apparent anesthesia complications

## 2013-10-11 NOTE — Op Note (Signed)
PATIENT: Paige Moore DATE: 10/11/13   Preop Diagnosis: VIN3 of left labia minora  Postoperative Diagnosis: same  Surgery: Partial simple left vulvectomy  Surgeons:  Donaciano Eva, MD Assistant: none  Anesthesia: General   Anesthesiologist: Dr Kalman Shan  Estimated blood loss: 10 ml  IVF:  500 ml   Urine output: 520 ml   Complications: None   Pathology: left labia minora with marking stitch at 12 o'clock  Operative findings: 2cm raised leukoplakia area on mid left labia minora 2cm from midline.  Procedure: The patient was identified in the preoperative holding area. Informed consent was signed on the chart. Patient was seen history was reviewed and exam was performed.   The patient was then taken to the operating room and placed in the supine position with SCD hose on. General anesthesia was then induced without difficulty. She was then placed in the dorsolithotomy position. The perineum was prepped with Betadine. The vagina was prepped with Betadine. The patient was then draped after the prep was dried. A Foley catheter was inserted into the bladder under sterile conditions.  Timeout was performed the patient, procedure, antibiotic, allergy, and length of procedure. 5% acetic acid solution was applied to the perineum. The vulvar tissues were inspected for areas of acetowhite changes or leukoplakia. The lesion was identified and the marking pen was used to circumscribe the area with appropriate surgical margins. The subcuticular tissues were infiltrated with 1% lidocaine. The 15 blade scalpel was used to make an incision through the skin circumferentially as marked. The skin elipse was grasped and was separated from the underlying deep dermal tissues with the bovie device. After the specimen had been completely resected, it was oriented and marked at 12 o'clock with a 0-vicryl suture. The bovie was used to obtain hemostasis at the surgical bed. The subcutaneous tissues  were irrigated and made hemostatic.   The deep dermal layer was approximated with 3-0vicryl mattress sutures to bring the skin edges into approximation and off tension. The wound was closed following langher's lines. The cutaneous layer was closed with interrupted 4-0 vicryl stitches and mattress sutures to ensure a tension free and hemostatic closure. The perineum was again irrigated. The foley was removed.  All instrument, suture, laparotomy, Ray-Tec, and needle counts were correct x2. The patient tolerated the procedure well and was taken recovery room in stable condition. This is Paige Moore dictating an operative note on Paige Moore.

## 2013-10-11 NOTE — Anesthesia Preprocedure Evaluation (Signed)
Anesthesia Evaluation  Patient identified by MRN, date of birth, ID band Patient awake    Reviewed: Allergy & Precautions, H&P , NPO status , Patient's Chart, lab work & pertinent test results  Airway Mallampati: II TM Distance: <3 FB Neck ROM: Full    Dental no notable dental hx.    Pulmonary neg pulmonary ROS, former smoker,  breath sounds clear to auscultation  Pulmonary exam normal       Cardiovascular negative cardio ROS  Rhythm:Regular Rate:Normal     Neuro/Psych negative neurological ROS  negative psych ROS   GI/Hepatic negative GI ROS, Neg liver ROS,   Endo/Other  Morbid obesity  Renal/GU negative Renal ROS  negative genitourinary   Musculoskeletal negative musculoskeletal ROS (+)   Abdominal   Peds negative pediatric ROS (+)  Hematology negative hematology ROS (+)   Anesthesia Other Findings   Reproductive/Obstetrics negative OB ROS                           Anesthesia Physical Anesthesia Plan  ASA: II  Anesthesia Plan: General   Post-op Pain Management:    Induction: Intravenous  Airway Management Planned: LMA  Additional Equipment:   Intra-op Plan:   Post-operative Plan: Extubation in OR  Informed Consent: I have reviewed the patients History and Physical, chart, labs and discussed the procedure including the risks, benefits and alternatives for the proposed anesthesia with the patient or authorized representative who has indicated his/her understanding and acceptance.   Dental advisory given  Plan Discussed with: CRNA and Surgeon  Anesthesia Plan Comments:         Anesthesia Quick Evaluation

## 2013-10-11 NOTE — Interval H&P Note (Signed)
History and Physical Interval Note:  10/11/2013 7:22 AM  Paige Moore  has presented today for surgery, with the diagnosis of VULVAR CANCER  The various methods of treatment have been discussed with the patient and family. After consideration of risks, benefits and other options for treatment, the patient has consented to  Procedure(s): WIDE LOCAL EXCISON (N/A) of vulva as a surgical intervention .  The patient's history has been reviewed, patient examined, no change in status, stable for surgery.  I have reviewed the patient's chart and labs.  Questions were answered to the patient's satisfaction.     Donaciano Eva

## 2013-10-11 NOTE — Anesthesia Procedure Notes (Signed)
Procedure Name: LMA Insertion Date/Time: 10/11/2013 7:37 AM Performed by: Mechele Claude Pre-anesthesia Checklist: Patient identified, Emergency Drugs available, Suction available and Patient being monitored Patient Re-evaluated:Patient Re-evaluated prior to inductionOxygen Delivery Method: Circle System Utilized Preoxygenation: Pre-oxygenation with 100% oxygen Intubation Type: IV induction Ventilation: Mask ventilation without difficulty LMA: LMA inserted LMA Size: 4.0 Number of attempts: 1 Airway Equipment and Method: bite block Placement Confirmation: positive ETCO2 Tube secured with: Tape Dental Injury: Teeth and Oropharynx as per pre-operative assessment

## 2013-10-11 NOTE — H&P (View-Only) (Signed)
Consult Note: Gyn-Onc  Consult was requested by Dr. 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 Vulvar Cancer    Assessment/Plan:  Paige Moore  is a 68 y.o.  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 Hulan Fray.  Paige Moore has a well-circumscribed 3 cm lesion at the left mid labia minora and vaginal introitus. I feel it is most amenable to a wide local excision. I favor this over laser in this case because a. I would like to ensure that pathology from lesion is obtained to ensure there in no foci of invasive carcinoma and b. It is a singular lesion of a small size which is amenable to excision and primary reapproximation.  I described to the patient the operative process including the goal of negative margins, and primary closure. I discussed that there is a risk for wound separation postoperatively, but if that occurs typically the wound is managed conservatively and healing by primary intention. I discussed that with her history of prediabetes she is at an increased risk for wound separation or wound infection and that optimization of her blood glucose preoperatively and perioperatively is ideal. We discussed that this is a procedure that can be performed outpatient. I discussed postoperative vulva wound care with the patient and anticipated postoperative discomfort. I discussed the high risk for recurrence of these lesions (25%) and the need for additional procedures in the future for recurrent dysplastic lesions. I also discussed that this lesion raises her risk for a future invasive vulvar cancer and that she will require close follow up with Dr. Hulan Fray postoperatively.   HPI: Paige Moore is a 68 year old woman who is seen in consultation at the request of Dr. Hulan Fray for VIN 3. The patient reports a 3 month history of vulvar pruritus, and intermittent bleeding. She denies  urinary incontinence, history of abnormal Pap smears, or history of lichen sclerosis. She is a former smoker. She saw her at Dr. Hulan Fray for evaluation on 09/04/2013 and a punch biopsy was performed of the left vulvar and the pathology revealed vulvar intraepithelial neoplasia 3, warty type.   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    Current Meds:  Outpatient Encounter Prescriptions as of 09/24/2013  Medication Sig  . valACYclovir (VALTREX) 1000 MG tablet Take 1,000 mg by mouth daily.  Marland Kitchen acetaminophen (TYLENOL) 500 MG tablet Take by mouth. 1-2 tabs po tid prn   . AMBULATORY NON FORMULARY MEDICATION Medication Name: Accu-Chek Aviva test strips and lancets.  Marland Kitchen ibuprofen (ADVIL,MOTRIN) 200 MG tablet Take 200 mg by mouth every 6 (six) hours as needed.  . pravastatin (PRAVACHOL) 80 MG tablet Take 1 tablet (80 mg total) by mouth at bedtime.  . propranolol (INDERAL) 10 MG tablet Take 1 tablet (10 mg total) by mouth 2 (two) times daily as needed.    Allergy: No Known Allergies  Social Hx:   History   Social History  . Marital Status: Married    Spouse Name: N/A    Number of Children: 2  . Years of Education: N/A   Occupational History  . works part time      [employer].    Social History Main Topics  . Smoking status: Former Smoker -- 15 years    Types: Cigarettes    Quit date: 03/15/2006  .  Smokeless tobacco: Not on file  . Alcohol Use: No  . Drug Use: No  . Sexual Activity: Yes   Other Topics Concern  . Not on file   Social History Narrative   Some walking for exercise.     Past Surgical Hx:  Past Surgical History  Procedure Laterality Date  . Ankle fracture surgery      right  . Cesarean section      X 2  . Total abdominal hysterectomy    . Joint replacement      L hip replacement, [facility] 09-2009  . Joint replacement  07/11/2010    L TKR Dr Enis Slipper [facility])    Past Medical Hx:  Past Medical History  Diagnosis Date  . DJD  (degenerative joint disease) of knee   . DJD (degenerative joint disease) of hip   . IFG (impaired fasting glucose)   . Obesity   . Hyperlipidemia   . S/P left knee arthroscopy     Past Gynecological History:  LMP at age 22 at time of hysterectomy for bleeding (ovaries remained, and cervix). No hx of abnormal pap (patient believes last pap was in the last 3 years). No postmenopausal bleeding. Hx of c/s x 2.  No LMP recorded. Patient has had a hysterectomy.  Family Hx:  Family History  Problem Relation Age of Onset  . Osteoporosis Mother   . Hypothyroidism Mother   . Diabetes Father     Review of Systems:  Constitutional  Feels well,    ENT Normal appearing ears and nares bilaterally Skin/Breast  No rash, sores, jaundice, itching, dryness Cardiovascular  No chest pain, shortness of breath, or edema  Pulmonary  No cough or wheeze.  Gastro Intestinal  No nausea, vomitting, or diarrhoea. No bright red blood per rectum, no abdominal pain, change in bowel movement, or constipation.  Genito Urinary  No frequency, urgency, dysuria, + vulvar prutitis Musculo Skeletal  No myalgia, arthralgia, joint swelling or pain  Neurologic  No weakness, numbness, change in gait,  Psychology  No depression, anxiety, insomnia.   Vitals:  Blood pressure 122/87, pulse 87, temperature 97.6 F (36.4 C), temperature source Oral, resp. rate 18, height 5' 3.5" (1.613 m), weight 219 lb 8 oz (99.565 kg).  Physical Exam: WD in NAD Neck  Supple NROM, without any enlargements.  Lymph Node Survey No cervical supraclavicular or inguinal adenopathy Cardiovascular  Pulse normal rate, regularity and rhythm. S1 and S2 normal.  Lungs  Clear to auscultation bilateraly, without wheezes/crackles/rhonchi. Good air movement.  Skin  No rash/lesions/breakdown  Psychiatry  Alert and oriented to person, place, and time  Abdomen  Normoactive bowel sounds, abdomen soft, non-tender and obese without evidence of  hernia.  Back No CVA tenderness Genito Urinary  Vulva/vagina: 2.5cm lesion on the left labia minora/vaginal introitus. 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 No bleeding. Surrounding skin normal. Agglutination (physiologic) of labia minora. No lesions. No discharge or bleeding.  Bladder/urethra:  No lesions or masses, well supported bladder  Vagina: normal, atrophic appearing  Cervix: Normal appearing, no lesions.  Uterus: surgically absent  Adnexa: no masses. Rectal  deferred Extremities  No bilateral cyanosis, clubbing or edema.   @[facility] @ 09/24/2013, 2:42 PM

## 2013-10-11 NOTE — Discharge Instructions (Addendum)
Vulvectomy, Care After  The vulva is the external female genitalia, outside and around the vagina and pubic bone. It consists of:  The skin on, and in front of, the pubic bone.  The clitoris.  The labia majora (large lips) on the outside of the vagina.  The labia minora (small lips) around the opening of the vagina.  The opening and the skin in and around the vagina. A vulvectomy is the removal of the tissue of the vulva, which sometimes includes removal of the lymph nodes and tissue in the groin areas.  These discharge instructions provide you with general information on caring for yourself after you leave the hospital. It is also important that you know the warning signs of complications, so that you can seek treatment. Please read the instructions outlined below and refer to this sheet in the next few weeks. Your caregiver may also give you specific information and medicines. If you have any questions or complications after discharge, please call your caregiver.  ACTIVITY  Rest as much as possible the first two weeks after discharge.  Arrange to have help from family or others with your daily activities when you go home.  Avoid heavy lifting (more than 5 pounds), pushing, or pulling.  If you feel tired, balance your activity with rest periods.  Follow your caregiver's instruction about climbing stairs and driving a car.  Increase activity gradually.  Do not exercise until you have permission from your caregiver. LEG AND FOOT CARE  If your doctor has removed lymph nodes from your groin area, there may be an increase in swelling of your legs and feet. You can help prevent swelling by doing the following:  Elevate your legs while sitting or lying down.  If your caregiver has ordered special stockings, wear them according to instructions.  Avoid standing in one place for long periods of time.  Call the physical therapy department if you have any questions about swelling or treatment for  swelling.  Avoid salt in your diet. It can cause fluid retention and swelling.  Do not cross your legs, especially when sitting. NUTRITION  You may resume your normal diet.  Drink 6 to 8 glasses of fluids a day.  Eat a healthy, balanced diet including portions of food from the meat (protein), milk, fruit, vegetable, and bread groups.  Your caregiver may recommend you take a multivitamin with iron. ELIMINATION  You may notice that your stream of urine is at a different angle, and may tend to spray. Using a plastic funnel may help to decrease urine spray.  If constipation occurs, drink more liquids, and add more fruits, vegetables, and bran to your diet. You may take a mild laxative, such as Milk of Magnesia, Metamucil, or a stool softener such as Colace, with permission from your caregiver. HYGIENE  You may shower and wash your hair.  Check with your caregiver about tub baths.  Do not add any bath oils or chemicals to your bath water, after you have permission to take baths.  Clean yourself well after moving your bowels.  After urinating, wipe from top to bottom.  A sitz bath will help keep your perineal area clean, reduce swelling, and provide comfort. HOME CARE INSTRUCTIONS  Take your temperature twice a day and record it, especially if you feel feverish or have chills.  Follow your caregiver's instructions about medicines, activity, and follow-up appointments after surgery.  Do not drink alcohol while taking pain medicine.  Change your dressing as advised by  your caregiver.  You may take over-the-counter medicine for pain, recommended by your caregiver.  If your pain is not relieved with medicine, call your caregiver.  Do not take aspirin because it can cause bleeding.  Do not douche or use tampons (use a nonperfumed sanitary pad).  Do not have sexual intercourse until your caregiver gives you permission. Hugging, kissing, and playful sexual activity is fine with your caregiver's  permission.  Warm sitz baths, with your caregiver's permission, are helpful to control swelling and discomfort.  Take showers instead of baths, until your caregiver gives you permission to take baths.  You may take a mild medicine for constipation, recommended by your caregiver. Bran foods and drinking a lot of fluids will help with constipation.  Make sure your family understands everything about your operation and recovery. SEEK MEDICAL CARE IF:  You notice swelling and redness around the wound area.  You notice a foul smell coming from the wound or on the surgical dressing.  You notice the wound is separating.  You have painful or bloody urination.  You develop nausea and vomiting.  You develop diarrhea.  You develop a rash.  You have a reaction or allergy from the medicine.  You feel dizzy or light-headed.  You need stronger pain medicine. SEEK IMMEDIATE MEDICAL CARE IF:  You develop a temperature of 102 F (38.9 C) or higher.  You pass out.  You develop leg or chest pain.  You develop abdominal pain.  You develop shortness of breath.  You develop bleeding from the wound area.  You see pus in the wound area. MAKE SURE YOU:  Understand these instructions.  Will watch your condition.  Will get help right away if you are not doing well or get worse. Document Released: 10/14/2003 Document Revised: 07/16/2013 Document Reviewed: 01/31/2009  [facility] Patient Information 2015 [place], [place]. This information is not intended to replace advice given to you by your health care provider. Make sure you discuss any questions you have with your health care provider.   Post Anesthesia Home Care Instructions  Activity: Get plenty of rest for the remainder of the day. A responsible adult should stay with you for 24 hours following the procedure.  For the next 24 hours, DO NOT: -Drive a car -Paediatric nurse -Drink alcoholic beverages -Take any medication unless instructed by your  physician -Make any legal decisions or sign important papers.  Meals: Start with liquid foods such as gelatin or soup. Progress to regular foods as tolerated. Avoid greasy, spicy, heavy foods. If nausea and/or vomiting occur, drink only clear liquids until the nausea and/or vomiting subsides. Call your physician if vomiting continues.  Special Instructions/Symptoms: Your throat may feel dry or sore from the anesthesia or the breathing tube placed in your throat during surgery. If this causes discomfort, gargle with warm salt water. The discomfort should disappear within 24 hours.

## 2013-10-15 ENCOUNTER — Encounter (HOSPITAL_BASED_OUTPATIENT_CLINIC_OR_DEPARTMENT_OTHER): Payer: Self-pay | Admitting: Gynecologic Oncology

## 2013-10-29 ENCOUNTER — Encounter: Payer: Self-pay | Admitting: Gynecologic Oncology

## 2013-10-29 ENCOUNTER — Ambulatory Visit: Payer: Medicare Other | Attending: Gynecologic Oncology | Admitting: Gynecologic Oncology

## 2013-10-29 VITALS — BP 112/74 | HR 89 | Temp 98.5°F | Resp 16 | Ht 63.5 in | Wt 216.7 lb

## 2013-10-29 DIAGNOSIS — E785 Hyperlipidemia, unspecified: Secondary | ICD-10-CM | POA: Insufficient documentation

## 2013-10-29 DIAGNOSIS — G252 Other specified forms of tremor: Secondary | ICD-10-CM

## 2013-10-29 DIAGNOSIS — G250 Essential tremor: Secondary | ICD-10-CM | POA: Diagnosis not present

## 2013-10-29 DIAGNOSIS — Z79899 Other long term (current) drug therapy: Secondary | ICD-10-CM | POA: Diagnosis not present

## 2013-10-29 DIAGNOSIS — Z9079 Acquired absence of other genital organ(s): Secondary | ICD-10-CM | POA: Diagnosis not present

## 2013-10-29 DIAGNOSIS — M1990 Unspecified osteoarthritis, unspecified site: Secondary | ICD-10-CM | POA: Insufficient documentation

## 2013-10-29 DIAGNOSIS — D071 Carcinoma in situ of vulva: Secondary | ICD-10-CM | POA: Insufficient documentation

## 2013-10-29 DIAGNOSIS — Z87891 Personal history of nicotine dependence: Secondary | ICD-10-CM | POA: Insufficient documentation

## 2013-10-29 NOTE — Progress Notes (Signed)
Postop Note: Gyn-Onc  Consult was requested by Dr. 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 VIN 3    Follow up    Assessment/Plan:  [name]  is a 68 y.o.  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 Hulan Fray.  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 Pathology confirmed no invasive disease, and negative margins. The patient had no issues postoperatively and has healed without complications.  I discussed with the patient that she has a 25% risk for recurrence of this lesion and therefore requires close followup with vulvar surveillance by a gynecologist the gynecologist every 6 months for the next 5 years. I discussed that if recurrence is observed I would recommend reexcision of the lesion.  She also reports symptoms somewhat concerning for shingles on her left buttock and I recommended that she discuss this with her primary care doctor as there are oral medications which can prevent relapses of shingles.  Micaella should see Dr. Hulan Fray for followup in 6 months time, and I would be happy to see her alternating each 6 months for ongoing surveillance.   HPI: [name] is a 68 year old woman who was seen in consultation at the request of Dr. Hulan Fray for VIN 3. The patient reports a 3 month history of vulvar pruritus, and intermittent bleeding. She denies urinary incontinence, history of abnormal Pap smears, or history of lichen sclerosis. She is a former smoker. She saw her at Dr. Hulan Fray for evaluation on 09/04/2013 and a punch biopsy was performed of the left vulvar and the pathology revealed vulvar intraepithelial neoplasia 3, warty type.   On 10/11/13 she underwent a partial simple vulvectomy (left) with final pathology revealing VIN3 with negative margins.  Interval History: She  reports one episode of vulvar bleeding postoperatively.    Current Meds:  Outpatient Encounter Prescriptions as of 10/29/2013  Medication Sig  . acetaminophen (TYLENOL) 500 MG tablet Take by mouth. 1-2 tabs po tid prn   . pravastatin (PRAVACHOL) 80 MG tablet Take 1 tablet (80 mg total) by mouth at bedtime.  . propranolol (INDERAL) 10 MG tablet Take 10 mg by mouth 2 (two) times daily as needed.  . docusate sodium (COLACE) 100 MG capsule Take 1 capsule (100 mg total) by mouth 2 (two) times daily.  Marland Kitchen ibuprofen (ADVIL,MOTRIN) 200 MG tablet Take 200 mg by mouth every 6 (six) hours as needed.  Marland Kitchen oxyCODONE-acetaminophen (PERCOCET/ROXICET) 5-325 MG per tablet Take 1 tablet by mouth every 6 (six) hours as needed for severe pain.    Allergy: No Known Allergies  Social Hx:   History   Social History  . Marital Status: Widowed    Spouse Name: N/A    Number of Children: 2  . Years of Education: N/A   Occupational History  . works part time      [employer].    Social History Main Topics  . Smoking status: Former Smoker -- 0.25 packs/day for 15 years    Types: Cigarettes    Quit date: 03/15/2006  . Smokeless tobacco: Never Used  . Alcohol Use: No  . Drug Use: No  . Sexual Activity: Not on file   Other Topics Concern  . Not on file   Social History  Narrative   Some walking for exercise.     Past Surgical Hx:  Past Surgical History  Procedure Laterality Date  . Cesarean section      X 2  . Orif right ankle fx  1987    later hardware removed  . Total hip arthroplasty Left 7/ 2011    [facility]  . Total knee arthroplasty Left 07-11-2010   [facility]  . Plantar fascia release  12-02-2003    RIGHT HEEL  . Knee arthroscopy Left   . Total abdominal hysterectomy  age 22  . Wrist ganglion excision Left 2001  . Vulvectomy Left 10/11/2013    Procedure: WIDE LOCAL EXCISON OF LEFT VULVA;  Surgeon: Everitt Amber, MD;  Location: [facility];  Service: Gynecology;  Laterality:  Left;    Past Medical Hx:  Past Medical History  Diagnosis Date  . Hyperlipidemia   . VIN III (vulvar intraepithelial neoplasia III)   . OA (osteoarthritis)   . Essential tremor   . Prediabetes   . Wears glasses   . Wears dentures     upper  . PONV (postoperative nausea and vomiting)     Past Gynecological History:  LMP at age 66 at time of hysterectomy for bleeding (ovaries remained, and cervix). No hx of abnormal pap (patient believes last pap was in the last 3 years). No postmenopausal bleeding. Hx of c/s x 2.  No LMP recorded. Patient has had a hysterectomy.  Family Hx:  Family History  Problem Relation Age of Onset  . Osteoporosis Mother   . Hypothyroidism Mother   . Diabetes Father   . Cancer Sister     endometrial cancer    Review of Systems:  Constitutional  Feels well,    ENT Normal appearing ears and nares bilaterally Skin/Breast  No rash, sores, jaundice, itching, dryness Cardiovascular  No chest pain, shortness of breath, or edema  Pulmonary  No cough or wheeze.  Gastro Intestinal  No nausea, vomitting, or diarrhoea. No bright red blood per rectum, no abdominal pain, change in bowel movement, or constipation.  Genito Urinary  No frequency, urgency, dysuria, + vulvar prutitis Musculo Skeletal  No myalgia, arthralgia, joint swelling or pain  Neurologic  No weakness, numbness, change in gait,  Psychology  No depression, anxiety, insomnia.   Vitals:  Blood pressure 112/74, pulse 89, temperature 98.5 F (36.9 C), resp. rate 16, height 5' 3.5" (1.613 m), weight 216 lb 11.2 oz (98.294 kg).  Physical Exam: WD in NAD Neck  Supple NROM, without any enlargements.  Lymph Node Survey No cervical supraclavicular or inguinal adenopathy Cardiovascular  Pulse normal rate, regularity and rhythm. S1 and S2 normal.  Lungs  Clear to auscultation bilateraly, without wheezes/crackles/rhonchi. Good air movement.  Skin  No rash/lesions/breakdown  Psychiatry   Alert and oriented to person, place, and time  Abdomen  Normoactive bowel sounds, abdomen soft, non-tender and obese without evidence of hernia.  Back No CVA tenderness Genito Urinary  Vulva/vagina: 5cm incision on left labia minora healing well with some mild wound separation. The sutures are predominantly resorbed. No residual lesions visible. No evidence for infection or cellulitis.  Bladder/urethra:  No lesions or masses, well supported bladder  Vagina: normal, atrophic appearing  Cervix: Normal appearing, no lesions.  Uterus: surgically absent  Adnexa: no masses. Rectal  deferred Extremities  No bilateral cyanosis, clubbing or edema.   Donaciano Eva, MD   10/29/2013, 3:51 PM

## 2013-10-29 NOTE — Patient Instructions (Signed)
Plan to see Dr. Hulan Fray in six months (March 2016) and Dr. Denman George in Sept 2016 for follow up or sooner if needed.  Please call to schedule your appointments.

## 2013-11-22 ENCOUNTER — Encounter: Payer: Medicare Other | Admitting: Family Medicine

## 2013-11-23 ENCOUNTER — Ambulatory Visit: Payer: Medicare Other | Admitting: Gynecologic Oncology

## 2014-01-14 ENCOUNTER — Encounter: Payer: Self-pay | Admitting: Gynecologic Oncology

## 2014-01-23 ENCOUNTER — Telehealth: Payer: Self-pay

## 2014-01-23 NOTE — Telephone Encounter (Signed)
Patient has been scheduled on 02/12/2014 for a pre-operative clearance for total knee replacement on 02/19/2014.

## 2014-02-12 ENCOUNTER — Encounter: Payer: Self-pay | Admitting: Family Medicine

## 2014-02-12 ENCOUNTER — Ambulatory Visit (INDEPENDENT_AMBULATORY_CARE_PROVIDER_SITE_OTHER): Payer: Medicare Other | Admitting: Family Medicine

## 2014-02-12 VITALS — BP 150/84 | HR 82 | Wt 221.0 lb

## 2014-02-12 DIAGNOSIS — R259 Unspecified abnormal involuntary movements: Secondary | ICD-10-CM

## 2014-02-12 DIAGNOSIS — E119 Type 2 diabetes mellitus without complications: Secondary | ICD-10-CM

## 2014-02-12 DIAGNOSIS — Z01818 Encounter for other preprocedural examination: Secondary | ICD-10-CM

## 2014-02-12 DIAGNOSIS — R258 Other abnormal involuntary movements: Secondary | ICD-10-CM

## 2014-02-12 DIAGNOSIS — G252 Other specified forms of tremor: Secondary | ICD-10-CM

## 2014-02-12 DIAGNOSIS — R251 Tremor, unspecified: Secondary | ICD-10-CM

## 2014-02-12 LAB — POCT GLYCOSYLATED HEMOGLOBIN (HGB A1C): Hemoglobin A1C: 6.6

## 2014-02-12 MED ORDER — PROPRANOLOL HCL 20 MG PO TABS: 20.0000 mg | ORAL_TABLET | Freq: Two times a day (BID) | ORAL | Status: DC

## 2014-02-12 MED ORDER — METFORMIN HCL 500 MG PO TABS: 500.0000 mg | ORAL_TABLET | Freq: Every day | ORAL | Status: DC

## 2014-02-12 NOTE — Progress Notes (Signed)
Subjective:    Patient ID: Paige Moore, female    DOB: 02/23/1946, 68 y.o.   MRN: [id number]  HPI She's here today for preoperative clearance for full right  knee replacement next Tuesday with Dr. Annie Main Pill at [facility].  Does get some nausea/vomiting after anesthesia.  No hx of cardiac problems.  No CP or SOB.  No palpitations of flutter. No hx of blood clots.  They already did her EKG.  No hx of pulmonary problems.  No snoring.    Tremor - she would like to go up on her propranol. Currently on 10 mg twice a day. The tremors predominate only in her right hand.  IFG - no inc thirst or urination.    She has the following medical problems: Patient Active Problem List   Diagnosis Date Noted  . 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  . Left knee DJD 06/16/2010  . Metabolic syndrome 32/44/0102  . Anemia due to blood loss 06/16/2010  . LUMBAGO 06/06/2009  . Diabetes mellitus type 2, controlled, without complications 72/53/6644  . HYPERLIPIDEMIA 12/21/2005  . OBESITY, NOS 12/21/2005  . 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 MENOPAUSAL SYNDROME 12/21/2005  . OSTEOARTHRITIS, LOWER LEG 12/21/2005  . PLANTAR FASCITIS 12/21/2005  . INCONTINENCE, URGE 12/21/2005     Review of Systems Comprehensive ROS is negative  BP 150/84 mmHg  Pulse 82  Wt 221 lb (100.245 kg)  SpO2 94%    No Known Allergies  Past Medical History  Diagnosis Date  . Hyperlipidemia   . VIN III (vulvar intraepithelial neoplasia III)   . OA (osteoarthritis)   . Essential tremor   . Prediabetes   . Wears glasses   . Wears dentures     upper  . PONV (postoperative nausea and vomiting)     Past Surgical History  Procedure Laterality Date  . Cesarean section      X 2  . Orif right ankle fx  1987    later hardware removed  . Total hip arthroplasty Left 7/ 2011    [facility]  . Total knee arthroplasty Left 07-11-2010   [facility]  . Plantar fascia release  12-02-2003    RIGHT HEEL  . Knee arthroscopy Left    . Total abdominal hysterectomy  age 54  . Wrist ganglion excision Left 2001  . Vulvectomy Left 10/11/2013    Procedure: WIDE LOCAL EXCISON OF LEFT VULVA;  Surgeon: Everitt Amber, MD;  Location: [facility];  Service: Gynecology;  Laterality: Left;    History   Social History  . Marital Status: Widowed    Spouse Name: N/A    Number of Children: 2  . Years of Education: N/A   Occupational History  . works part time      [employer].    Social History Main Topics  . Smoking status: Former Smoker -- 0.25 packs/day for 15 years    Types: Cigarettes    Quit date: 03/15/2006  . Smokeless tobacco: Never Used  . Alcohol Use: No  . Drug Use: No  . Sexual Activity: Not on file   Other Topics Concern  . Not on file   Social History Narrative   Some walking for exercise.     Family History  Problem Relation Age of Onset  . Osteoporosis Mother   . Hypothyroidism Mother   . Diabetes Father   . Cancer Sister     endometrial cancer    Outpatient Encounter Prescriptions as of 02/12/2014  Medication Sig  . acetaminophen (TYLENOL) 500 MG tablet Take by mouth. 1-2 tabs po tid prn   . docusate sodium (COLACE) 100 MG capsule Take 1 capsule (100 mg total) by mouth 2 (two) times daily.  Marland Kitchen ibuprofen (ADVIL,MOTRIN) 200 MG tablet Take 200 mg by mouth every 6 (six) hours as needed.  Marland Kitchen oxyCODONE-acetaminophen (PERCOCET/ROXICET) 5-325 MG per tablet Take 1 tablet by mouth every 6 (six) hours as needed for severe pain.  . pravastatin (PRAVACHOL) 80 MG tablet Take 1 tablet (80 mg total) by mouth at bedtime.  . propranolol (INDERAL) 20 MG tablet Take 1 tablet (20 mg total) by mouth 2 (two) times daily.  . metFORMIN (GLUCOPHAGE) 500 MG tablet Take 1 tablet (500 mg total) by mouth daily with breakfast.  . [DISCONTINUED] propranolol (INDERAL) 10 MG tablet Take 10 mg by mouth 2 (two) times daily as needed.          Objective:   Physical Exam  Constitutional: She is oriented to  person, place, and time. She appears well-developed and well-nourished.  HENT:  Head: Normocephalic and atraumatic.  Right Ear: External ear normal.  Left Ear: External ear normal.  Nose: Nose normal.  Mouth/Throat: Oropharynx is clear and moist.  TMs and canals are clear.   Eyes: Conjunctivae and EOM are normal. Pupils are equal, round, and reactive to light.  Neck: Neck supple. No thyromegaly present.  Cardiovascular: Normal rate, regular rhythm and normal heart sounds.   Pulmonary/Chest: Effort normal and breath sounds normal. She has no wheezes.  Abdominal: Soft. Bowel sounds are normal. She exhibits no distension and no mass. There is no tenderness. There is no rebound and no guarding.  Musculoskeletal: She exhibits no edema.  Lymphadenopathy:    She has no cervical adenopathy.  Neurological: She is alert and oriented to person, place, and time.  Skin: Skin is warm and dry. No pallor.  Psychiatric: She has a normal mood and affect. Her behavior is normal.          Assessment & Plan:  Preoperative surgical clearance for right knee replacement-patient is overall low risk with no prior history of cardiac or pulmonary disease. She does have a history of postoperative nausea and vomiting. EKG blood work was evidently done ahead of time. I do not have a copy of these  DM- new dx. She has been IFG for several years.  A1C is 6.6. Will start metformin once a day. Warned about GI sxs. F/u in 3 months. She has an glucometer already. She will call us with the name and we can write for her strips to start checking her sugar 1-2 times per week and if not feeling well.   Tremor- increased her propranolol to 20 mg. 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  Can still consider dopaminergic agent. We had discussed this aobut a year ago. We'll continue to monitor carefully.

## 2014-02-12 NOTE — Patient Instructions (Signed)
Call us with the name of your machine.

## 2014-02-14 ENCOUNTER — Other Ambulatory Visit: Payer: Self-pay | Admitting: Family Medicine

## 2014-02-15 ENCOUNTER — Other Ambulatory Visit: Payer: Self-pay

## 2014-02-15 DIAGNOSIS — E119 Type 2 diabetes mellitus without complications: Secondary | ICD-10-CM

## 2014-02-15 MED ORDER — FREESTYLE LANCETS MISC: Status: DC

## 2014-02-15 MED ORDER — FREESTYLE LANCETS MISC: 1.0000 | Status: DC

## 2014-02-15 MED ORDER — GLUCOSE BLOOD VI STRP: 1.0000 | ORAL_STRIP | Status: DC

## 2014-02-15 MED ORDER — GLUCOSE BLOOD VI STRP: ORAL_STRIP | Status: DC

## 2014-02-19 ENCOUNTER — Other Ambulatory Visit: Payer: Self-pay

## 2014-02-19 MED ORDER — GLUCOSE BLOOD VI STRP
ORAL_STRIP | Status: DC
Start: 2014-02-19 — End: 2015-02-25

## 2014-02-19 MED ORDER — ONETOUCH ULTRASOFT LANCETS MISC: Status: DC

## 2014-02-19 MED ORDER — ONETOUCH ULTRA SYSTEM W/DEVICE KIT: 1.0000 | PACK | Freq: Once | Status: AC

## 2014-03-19 DIAGNOSIS — G20 Parkinson's disease: Secondary | ICD-10-CM | POA: Diagnosis not present

## 2014-03-19 DIAGNOSIS — Z5189 Encounter for other specified aftercare: Secondary | ICD-10-CM | POA: Diagnosis not present

## 2014-03-21 DIAGNOSIS — G20 Parkinson's disease: Secondary | ICD-10-CM | POA: Diagnosis not present

## 2014-03-21 DIAGNOSIS — Z5189 Encounter for other specified aftercare: Secondary | ICD-10-CM | POA: Diagnosis not present

## 2014-05-11 ENCOUNTER — Other Ambulatory Visit: Payer: Self-pay | Admitting: Family Medicine

## 2014-05-14 ENCOUNTER — Encounter: Payer: Self-pay | Admitting: Family Medicine

## 2014-05-14 ENCOUNTER — Ambulatory Visit (INDEPENDENT_AMBULATORY_CARE_PROVIDER_SITE_OTHER): Payer: Medicare Other | Admitting: Family Medicine

## 2014-05-14 VITALS — BP 116/70 | HR 77 | Wt 200.0 lb

## 2014-05-14 DIAGNOSIS — E785 Hyperlipidemia, unspecified: Secondary | ICD-10-CM

## 2014-05-14 DIAGNOSIS — E119 Type 2 diabetes mellitus without complications: Secondary | ICD-10-CM

## 2014-05-14 LAB — POCT GLYCOSYLATED HEMOGLOBIN (HGB A1C): Hemoglobin A1C: 6.3

## 2014-05-14 LAB — POCT UA - MICROALBUMIN
Albumin/Creatinine Ratio, Urine, POC: <30
Creatinine, POC: 300 mg/dL
Microalbumin Ur, POC: 30 mg/L

## 2014-05-14 MED ORDER — ATORVASTATIN CALCIUM 40 MG PO TABS
40.0000 mg | ORAL_TABLET | Freq: Every day | ORAL | Status: DC
Start: 2014-05-14 — End: 2015-04-02

## 2014-05-14 NOTE — Progress Notes (Signed)
   Subjective:    Patient ID: Paige Moore, female    DOB: 07/18/1945, 69 y.o.   MRN: [id number]  HPI Diabetes - no hypoglycemic events. No wounds or sores that are not healing well. No increased thirst or urination. Checking glucose at home. Taking medications as prescribed without any side effects. Has lost 21 lbs.  She is on metformin.   Hyperlipidemia - due for refilll on her stain. Tolerating it well without any myalgias or side effects.   Review of Systems     Objective:   Physical Exam  Constitutional: She is oriented to person, place, and time. She appears well-developed and well-nourished.  HENT:  Head: Normocephalic and atraumatic.  Cardiovascular: Normal rate, regular rhythm and normal heart sounds.   Pulmonary/Chest: Effort normal and breath sounds normal.  Neurological: She is alert and oriented to person, place, and time.  Skin: Skin is warm and dry.  Psychiatric: She has a normal mood and affect. Her behavior is normal.          Assessment & Plan:  DM- well-controlled with hemoglobin A1c of 6.3 today. In fact this is improved from previous. Great work. Lab Results  Component Value Date   HGBA1C 6.3 05/14/2014   Hyperlipidemia-we'll switch to atorvastatin 40 mg. Due for repeat lipids in June.

## 2014-07-11 ENCOUNTER — Other Ambulatory Visit: Payer: Self-pay | Admitting: Family Medicine

## 2014-07-11 DIAGNOSIS — Z1231 Encounter for screening mammogram for malignant neoplasm of breast: Secondary | ICD-10-CM

## 2014-07-17 ENCOUNTER — Ambulatory Visit (INDEPENDENT_AMBULATORY_CARE_PROVIDER_SITE_OTHER): Payer: Medicare Other

## 2014-07-17 DIAGNOSIS — Z1231 Encounter for screening mammogram for malignant neoplasm of breast: Secondary | ICD-10-CM

## 2014-07-22 LAB — HM DIABETES EYE EXAM

## 2014-08-31 ENCOUNTER — Other Ambulatory Visit: Payer: Self-pay | Admitting: Family Medicine

## 2014-09-23 ENCOUNTER — Ambulatory Visit (INDEPENDENT_AMBULATORY_CARE_PROVIDER_SITE_OTHER): Payer: Medicare Other | Admitting: Family Medicine

## 2014-09-23 ENCOUNTER — Encounter: Payer: Self-pay | Admitting: Family Medicine

## 2014-09-23 VITALS — BP 126/75 | HR 76 | Ht 64.0 in | Wt 200.0 lb

## 2014-09-23 DIAGNOSIS — E119 Type 2 diabetes mellitus without complications: Secondary | ICD-10-CM

## 2014-09-23 DIAGNOSIS — Z1159 Encounter for screening for other viral diseases: Secondary | ICD-10-CM | POA: Diagnosis not present

## 2014-09-23 DIAGNOSIS — E785 Hyperlipidemia, unspecified: Secondary | ICD-10-CM | POA: Diagnosis not present

## 2014-09-23 DIAGNOSIS — Z23 Encounter for immunization: Secondary | ICD-10-CM

## 2014-09-23 DIAGNOSIS — R251 Tremor, unspecified: Secondary | ICD-10-CM

## 2014-09-23 LAB — LIPID PANEL
Cholesterol: 119 mg/dL (ref 0–200)
HDL: 50 mg/dL (ref >=46)
LDL Cholesterol: 50 mg/dL (ref 0–99)
Total CHOL/HDL Ratio: 2.4 Ratio
Triglycerides: 95 mg/dL (ref <150)
VLDL: 19 mg/dL (ref 0–40)

## 2014-09-23 LAB — POCT GLYCOSYLATED HEMOGLOBIN (HGB A1C): Hemoglobin A1C: 6

## 2014-09-23 LAB — COMPLETE METABOLIC PANEL WITH GFR
ALT: 14 U/L (ref 0–35)
AST: 16 U/L (ref 0–37)
Albumin: 4.4 g/dL (ref 3.5–5.2)
Alkaline Phosphatase: 66 U/L (ref 39–117)
BUN: 17 mg/dL (ref 6–23)
CO2: 27 mEq/L (ref 19–32)
Calcium: 9.6 mg/dL (ref 8.4–10.5)
Chloride: 105 mEq/L (ref 96–112)
Creat: 0.77 mg/dL (ref 0.50–1.10)
GFR, Est African American: >89 mL/min
GFR, Est Non African American: 79 mL/min
Glucose, Bld: 102 mg/dL — ABNORMAL HIGH (ref 70–99)
Potassium: 4.2 mEq/L (ref 3.5–5.3)
Sodium: 143 mEq/L (ref 135–145)
Total Bilirubin: 0.7 mg/dL (ref 0.2–1.2)
Total Protein: 7 g/dL (ref 6.0–8.3)

## 2014-09-23 NOTE — Progress Notes (Signed)
   Subjective:    Patient ID: Paige Moore, female    DOB: 08-21-1945, 69 y.o.   MRN: [id number]  HPI Diabetes - no hypoglycemic events. No wounds or sores that are not healing well. No increased thirst or urination. Checking glucose at home. Taking medications as prescribed without any side effects.  Tremor - still taking propranolol and doing well with it. BP well controlled.   Review of Systems     Objective:   Physical Exam  Constitutional: She is oriented to person, place, and time. She appears well-developed and well-nourished.  HENT:  Head: Normocephalic and atraumatic.  Cardiovascular: Normal rate, regular rhythm and normal heart sounds.   Pulmonary/Chest: Effort normal and breath sounds normal.  Neurological: She is alert and oriented to person, place, and time.  Skin: Skin is warm and dry.  Psychiatric: She has a normal mood and affect. Her behavior is normal.          Assessment & Plan:  DM- well controlled.  A1C 6.0.  Due for CMP and lipids. On statin.  F/U in 3 months. Will call for eye exam.    Tremor - well controlled on propranolol.    Prevnar 13 given today.

## 2014-09-24 LAB — HEPATITIS C ANTIBODY: HCV Ab: NEGATIVE

## 2014-11-29 ENCOUNTER — Other Ambulatory Visit: Payer: Self-pay | Admitting: Family Medicine

## 2014-12-03 ENCOUNTER — Other Ambulatory Visit: Payer: Self-pay | Admitting: Family Medicine

## 2014-12-24 ENCOUNTER — Ambulatory Visit (INDEPENDENT_AMBULATORY_CARE_PROVIDER_SITE_OTHER): Payer: Medicare Other | Admitting: Family Medicine

## 2014-12-24 ENCOUNTER — Encounter: Payer: Self-pay | Admitting: Family Medicine

## 2014-12-24 VITALS — BP 121/73 | HR 78 | Temp 97.8°F | Resp 18 | Wt 202.7 lb

## 2014-12-24 DIAGNOSIS — E119 Type 2 diabetes mellitus without complications: Secondary | ICD-10-CM | POA: Diagnosis not present

## 2014-12-24 DIAGNOSIS — M25531 Pain in right wrist: Secondary | ICD-10-CM | POA: Diagnosis not present

## 2014-12-24 DIAGNOSIS — Z23 Encounter for immunization: Secondary | ICD-10-CM | POA: Diagnosis not present

## 2014-12-24 LAB — POCT GLYCOSYLATED HEMOGLOBIN (HGB A1C): Hemoglobin A1C: 6.1

## 2014-12-24 NOTE — Progress Notes (Signed)
   Subjective:    Patient ID: Paige Moore, female    DOB: 1945/04/19, 69 y.o.   MRN: [id number]  HPI Diabetes - no hypoglycemic events. No wounds or sores that are not healing well. No increased thirst or urination. Checking glucose at home. No recnt changes. Taking medications as prescribed without any side effects. Still walking.    Right wrist pain - hurts when she walk bt not when pushing the cart at the store. No old injuiries.  No swelling.     Review of Systems     Objective:   Physical Exam  Constitutional: She is oriented to person, place, and time. She appears well-developed and well-nourished.  HENT:  Head: Normocephalic and atraumatic.  Cardiovascular: Normal rate, regular rhythm and normal heart sounds.   Pulmonary/Chest: Effort normal and breath sounds normal.  Neurological: She is alert and oriented to person, place, and time.  Skin: Skin is warm and dry.  Psychiatric: She has a normal mood and affect. Her behavior is normal.          Assessment & Plan:  DM - A1c looks great today at 6.1. Continue current regimen. Continue to work on regular exercise. We did call to get a copy of her eye exam and received that today. Follow up in 3 months.  Right wrist pain - her pain is affected technically over the distal forearm but not at the wrist. Unclear etiology. Nontender on exam today. Her wrist appears normal. 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 We'll do some additional blood work to workup for rheumatoid arthritis at her next visit in 3 months when she's due for routine blood work.  Flu vaccine given today.

## 2015-02-25 ENCOUNTER — Other Ambulatory Visit: Payer: Self-pay | Admitting: Family Medicine

## 2015-02-27 ENCOUNTER — Telehealth: Payer: Self-pay | Admitting: *Deleted

## 2015-02-27 NOTE — Telephone Encounter (Signed)
Pt's sister called about getting the one touch test strips and lancets.  The pharmacy said it needs an updated ICD-10 code in order for insurance to cover.  Can you please resend. Thanks!

## 2015-02-28 ENCOUNTER — Other Ambulatory Visit: Payer: Self-pay

## 2015-02-28 MED ORDER — GLUCOSE BLOOD VI STRP: ORAL_STRIP | Status: DC

## 2015-02-28 MED ORDER — ONETOUCH DELICA LANCETS 33G MISC: Status: DC

## 2015-03-01 NOTE — Telephone Encounter (Signed)
Ok to sent new script for test strips with updated code.

## 2015-03-03 ENCOUNTER — Other Ambulatory Visit: Payer: Self-pay

## 2015-03-03 MED ORDER — GLUCOSE BLOOD VI STRP: ORAL_STRIP | Status: DC

## 2015-03-26 ENCOUNTER — Ambulatory Visit: Payer: Self-pay | Admitting: Family Medicine

## 2015-03-27 DIAGNOSIS — M2142 Flat foot [pes planus] (acquired), left foot: Secondary | ICD-10-CM | POA: Diagnosis not present

## 2015-03-27 DIAGNOSIS — M79672 Pain in left foot: Secondary | ICD-10-CM | POA: Diagnosis not present

## 2015-04-02 ENCOUNTER — Ambulatory Visit (INDEPENDENT_AMBULATORY_CARE_PROVIDER_SITE_OTHER): Payer: Medicare Other | Admitting: Family Medicine

## 2015-04-02 ENCOUNTER — Encounter: Payer: Self-pay | Admitting: Family Medicine

## 2015-04-02 VITALS — BP 126/51 | HR 77 | Wt 200.0 lb

## 2015-04-02 DIAGNOSIS — R251 Tremor, unspecified: Secondary | ICD-10-CM | POA: Diagnosis not present

## 2015-04-02 DIAGNOSIS — E119 Type 2 diabetes mellitus without complications: Secondary | ICD-10-CM

## 2015-04-02 DIAGNOSIS — M7672 Peroneal tendinitis, left leg: Secondary | ICD-10-CM

## 2015-04-02 LAB — POCT GLYCOSYLATED HEMOGLOBIN (HGB A1C): Hemoglobin A1C: 6.2

## 2015-04-02 MED ORDER — ATORVASTATIN CALCIUM 40 MG PO TABS: 40.0000 mg | ORAL_TABLET | Freq: Every day | ORAL | Status: DC

## 2015-04-02 MED ORDER — ONETOUCH DELICA LANCETS 33G MISC: Status: DC

## 2015-04-02 MED ORDER — PROPRANOLOL HCL 20 MG PO TABS
20.0000 mg | ORAL_TABLET | Freq: Two times a day (BID) | ORAL | Status: DC
Start: 2015-04-02 — End: 2015-10-01

## 2015-04-02 MED ORDER — METFORMIN HCL 500 MG PO TABS: ORAL_TABLET | ORAL | Status: DC

## 2015-04-02 MED ORDER — GLUCOSE BLOOD VI STRP: ORAL_STRIP | Status: DC

## 2015-04-02 NOTE — Progress Notes (Signed)
   Subjective:    Patient ID: Paige Moore, female    DOB: 10-21-1945, 70 y.o.   MRN: [id number]  HPI Diabetes - no hypoglycemic events. No wounds or sores that are not healing well. No increased thirst or urination. Checking glucose at home. Taking medications as prescribed without any side effects.  She has lost 2 lbs.    Posterior peroneal tendon flare.  She move into a new apartment and has a new dog and has been going up and down stairs more.  She was given an lace up ankle brace but says it hasn't helped. She is using a ankle wrap is helping.    She has had a shingles vaccine at [facility]. She will bring of copy of paperwork at next [place].    Review of Systems     Objective:   Physical Exam  Constitutional: She is oriented to person, place, and time. She appears well-developed and well-nourished.  HENT:  Head: Normocephalic and atraumatic.  Cardiovascular: Normal rate, regular rhythm and normal heart sounds.   Pulmonary/Chest: Effort normal and breath sounds normal.  Neurological: She is alert and oriented to person, place, and time.  Skin: Skin is warm and dry.  Psychiatric: She has a normal mood and affect. Her behavior is normal.          Assessment & Plan:  DM - well controlled. F/U in 3 months. Is been walking her dog more and getting more exercise which is fantastic.  Peroneal tendonitis - she is improving.   Tremor-doing well with propranolol. Refill printed.

## 2015-04-28 DIAGNOSIS — M79672 Pain in left foot: Secondary | ICD-10-CM | POA: Diagnosis not present

## 2015-04-28 DIAGNOSIS — Z96651 Presence of right artificial knee joint: Secondary | ICD-10-CM | POA: Diagnosis not present

## 2015-06-04 ENCOUNTER — Other Ambulatory Visit: Payer: Self-pay | Admitting: *Deleted

## 2015-06-04 MED ORDER — ONETOUCH DELICA LANCETS 33G MISC: Status: DC

## 2015-06-04 MED ORDER — GLUCOSE BLOOD VI STRP: ORAL_STRIP | Status: DC

## 2015-07-02 ENCOUNTER — Encounter: Payer: Self-pay | Admitting: Family Medicine

## 2015-07-02 ENCOUNTER — Ambulatory Visit (INDEPENDENT_AMBULATORY_CARE_PROVIDER_SITE_OTHER): Payer: Medicare Other | Admitting: Family Medicine

## 2015-07-02 VITALS — BP 118/61 | HR 72 | Wt 203.0 lb

## 2015-07-02 DIAGNOSIS — E119 Type 2 diabetes mellitus without complications: Secondary | ICD-10-CM

## 2015-07-02 DIAGNOSIS — R259 Unspecified abnormal involuntary movements: Secondary | ICD-10-CM

## 2015-07-02 DIAGNOSIS — R258 Other abnormal involuntary movements: Secondary | ICD-10-CM

## 2015-07-02 DIAGNOSIS — G252 Other specified forms of tremor: Secondary | ICD-10-CM

## 2015-07-02 LAB — BASIC METABOLIC PANEL WITH GFR
BUN: 16 mg/dL (ref 7–25)
CO2: 26 mmol/L (ref 20–31)
Calcium: 9.5 mg/dL (ref 8.6–10.4)
Chloride: 105 mmol/L (ref 98–110)
Creat: 0.78 mg/dL (ref 0.50–0.99)
GFR, Est African American: >89 mL/min (ref >=60)
GFR, Est Non African American: 78 mL/min (ref >=60)
Glucose, Bld: 105 mg/dL — ABNORMAL HIGH (ref 65–99)
Potassium: 4.1 mmol/L (ref 3.5–5.3)
Sodium: 141 mmol/L (ref 135–146)

## 2015-07-02 LAB — POCT UA - MICROALBUMIN
Albumin/Creatinine Ratio, Urine, POC: NORMAL
Creatinine, POC: 50 mg/dL
Microalbumin Ur, POC: 10 mg/L

## 2015-07-02 LAB — POCT GLYCOSYLATED HEMOGLOBIN (HGB A1C): Hemoglobin A1C: 5.9

## 2015-07-02 NOTE — Progress Notes (Signed)
   Subjective:    Patient ID: Paige Moore, female    DOB: 03/28/1945, 70 y.o.   MRN: [id number]  HPI Diabetes - no hypoglycemic events. No wounds or sores that are not healing well. No increased thirst or urination. Checking glucose at home. Taking medications as prescribed without any side effects.  Tremor- doing well on propranolol.    Review of Systems     Objective:   Physical Exam  Constitutional: She is oriented to person, place, and time. She appears well-developed and well-nourished.  HENT:  Head: Normocephalic and atraumatic.  Cardiovascular: Normal rate, regular rhythm and normal heart sounds.   Pulmonary/Chest: Effort normal and breath sounds normal.  Neurological: She is alert and oriented to person, place, and time.  Skin: Skin is warm and dry.  Psychiatric: She has a normal mood and affect. Her behavior is normal.        Assessment & Plan:  DM- Well controlled. Continue current regimen. Follow up in 4 mo. That exam performed today. Urine micral albumin performed today as well. She is on a statin.  Lab Results  Component Value Date   HGBA1C 6.2 04/02/2015   Tremor - stable on propranolol

## 2015-07-03 NOTE — Progress Notes (Signed)
Quick Note:  All labs are normal. ______ 

## 2015-10-01 ENCOUNTER — Encounter: Payer: Self-pay | Admitting: Family Medicine

## 2015-10-01 ENCOUNTER — Ambulatory Visit (INDEPENDENT_AMBULATORY_CARE_PROVIDER_SITE_OTHER): Payer: Medicare Other

## 2015-10-01 ENCOUNTER — Other Ambulatory Visit: Payer: Self-pay | Admitting: Family Medicine

## 2015-10-01 ENCOUNTER — Ambulatory Visit (INDEPENDENT_AMBULATORY_CARE_PROVIDER_SITE_OTHER): Payer: Medicare Other | Admitting: Family Medicine

## 2015-10-01 VITALS — BP 131/73 | HR 65 | Wt 207.0 lb

## 2015-10-01 DIAGNOSIS — M25541 Pain in joints of right hand: Secondary | ICD-10-CM

## 2015-10-01 DIAGNOSIS — M2550 Pain in unspecified joint: Secondary | ICD-10-CM | POA: Diagnosis not present

## 2015-10-01 DIAGNOSIS — R258 Other abnormal involuntary movements: Secondary | ICD-10-CM | POA: Diagnosis not present

## 2015-10-01 DIAGNOSIS — E785 Hyperlipidemia, unspecified: Secondary | ICD-10-CM

## 2015-10-01 DIAGNOSIS — E119 Type 2 diabetes mellitus without complications: Secondary | ICD-10-CM

## 2015-10-01 DIAGNOSIS — R259 Unspecified abnormal involuntary movements: Secondary | ICD-10-CM

## 2015-10-01 DIAGNOSIS — M25542 Pain in joints of left hand: Secondary | ICD-10-CM | POA: Diagnosis not present

## 2015-10-01 DIAGNOSIS — S63236A Subluxation of proximal interphalangeal joint of right little finger, initial encounter: Secondary | ICD-10-CM | POA: Diagnosis not present

## 2015-10-01 DIAGNOSIS — M19042 Primary osteoarthritis, left hand: Secondary | ICD-10-CM | POA: Diagnosis not present

## 2015-10-01 DIAGNOSIS — G252 Other specified forms of tremor: Secondary | ICD-10-CM

## 2015-10-01 LAB — RHEUMATOID FACTOR: Rhuematoid fact SerPl-aCnc: 24 IU/mL — ABNORMAL HIGH (ref <=14)

## 2015-10-01 LAB — URIC ACID: Uric Acid, Serum: 5.2 mg/dL (ref 2.5–7.0)

## 2015-10-01 LAB — LIPID PANEL
Cholesterol: 111 mg/dL — ABNORMAL LOW (ref 125–200)
HDL: 56 mg/dL (ref >=46)
LDL Cholesterol: 31 mg/dL (ref <130)
Total CHOL/HDL Ratio: 2 Ratio (ref <=5.0)
Triglycerides: 122 mg/dL (ref <150)
VLDL: 24 mg/dL (ref <30)

## 2015-10-01 LAB — C-REACTIVE PROTEIN: CRP: <0.5 mg/dL (ref <0.60)

## 2015-10-01 LAB — HEMOGLOBIN A1C
Hgb A1c MFr Bld: 6 % — ABNORMAL HIGH (ref <5.7)
Mean Plasma Glucose: 126 mg/dL

## 2015-10-01 MED ORDER — PROPRANOLOL HCL 20 MG PO TABS: 20.0000 mg | ORAL_TABLET | Freq: Two times a day (BID) | ORAL | Status: DC

## 2015-10-01 MED ORDER — METFORMIN HCL 500 MG PO TABS: ORAL_TABLET | ORAL | Status: DC

## 2015-10-01 NOTE — Progress Notes (Signed)
Subjective:    CC: DM  HPI: Diabetes - no hypoglycemic events. No wounds or sores that are not healing well. No increased thirst or urination. Checking glucose at home.She reports that they have been good. She did not actually bring in a glucose log today. Taking medications as prescribed without any side effects.  Hyperlipidemia-currently on atorvastatin 40 mg at bedtime. Tolerating medication well without any side effects or myalgias.  Polyarthralgia - patient complains of deformity of her fifth digit on her right hand. She said she did have an injury to it years ago but she feels like over the last year or 2 has started to curve to the outside and she's noticed a clicking or popping sensation when she tries to flex it. She's also starting to get some curvature on the distal and of the first finger on the right hand and the first finger on the left hand. She really like to be evaluated for other types of arthritis besides osteoarthritis.  Past medical history, Surgical history, Family history not pertinant except as noted below, Social history, Allergies, and medications have been entered into the medical record, reviewed, and corrections made.   Review of Systems: No fevers, chills, night sweats, weight loss, chest pain, or shortness of breath.   Objective:    General: Well Developed, well nourished, and in no acute distress.  Neuro: Alert and oriented x3, extra-ocular muscles intact, sensation grossly intact.  HEENT: Normocephalic, atraumatic  Skin: Warm and dry, no rashes. Cardiac: Regular rate and rhythm, no murmurs rubs or gallops, no lower extremity edema.  Respiratory: Clear to auscultation bilaterally. Not using accessory muscles, speaking in full sentences. Ext: She does have significant outward curvature with some enlargement of the PIP on the fifth digit on the right hand. She also has our curvature of the DIP joint on the right and left hands, first finger, both hands. She does  have some enlargement of the DIPs across all fingers. No significant redness or induration across the joints on the hands.  Impression and Recommendations:   DM- Hopefully well-controlled. We will get her hemoglobin A1c at the lab today because we are currently out of cartridges to check it here in the office today. Continue current regimen with metformin. She is on a statin as well. She's not currently on an ACE inhibitor. The go ahead and refill her metformin today.  Hyperlipidemia -tolerating statin well without any palms. Due for repeat lipid panel today. Will call with results once available.   Polyarthralgia-we'll get additional lab work including ANA, rheumatoid factor, anti-CCP, sedimentation rate, CRP, uric acid. We'll also go ahead and get plain film x-rays of both hands as well and will call with results once available. I suspect she probably just has osteoarthritis and that the curvature of the fifth digit is probably from injury from years ago.  Tremor-doing well on propranolol. Refill that today as well.

## 2015-10-02 LAB — ANA: Anti Nuclear Antibody(ANA): NEGATIVE

## 2015-10-02 LAB — CYCLIC CITRUL PEPTIDE ANTIBODY, IGG: Cyclic Citrullin Peptide Ab: <16 Units

## 2015-10-02 LAB — SEDIMENTATION RATE: Sed Rate: 1 mm/hr (ref 0–30)

## 2015-10-28 ENCOUNTER — Telehealth: Payer: Self-pay | Admitting: Family Medicine

## 2015-10-28 NOTE — Telephone Encounter (Signed)
Patient sister Henrietta Dine) called adv pt has gotten summoned to jury duty and she needs a letter stating that she is unable to attend due to her health problems. Please notify pt when letter is ready to pick up or if there are any concerns. Thanks

## 2015-10-29 NOTE — Telephone Encounter (Signed)
Letter is up front for pick up, patient's sister is aware.

## 2016-02-02 ENCOUNTER — Encounter: Payer: Self-pay | Admitting: Family Medicine

## 2016-02-02 ENCOUNTER — Ambulatory Visit (INDEPENDENT_AMBULATORY_CARE_PROVIDER_SITE_OTHER): Payer: Medicare Other | Admitting: Family Medicine

## 2016-02-02 VITALS — BP 129/73 | HR 82 | Ht 64.0 in | Wt 209.0 lb

## 2016-02-02 DIAGNOSIS — E119 Type 2 diabetes mellitus without complications: Secondary | ICD-10-CM

## 2016-02-02 DIAGNOSIS — G2581 Restless legs syndrome: Secondary | ICD-10-CM | POA: Diagnosis not present

## 2016-02-02 LAB — POCT GLYCOSYLATED HEMOGLOBIN (HGB A1C): Hemoglobin A1C: 6.7

## 2016-02-02 MED ORDER — METFORMIN HCL 500 MG PO TABS: 500.0000 mg | ORAL_TABLET | Freq: Two times a day (BID) | ORAL | 1 refills | Status: DC

## 2016-02-02 MED ORDER — ROPINIROLE HCL 0.25 MG PO TABS: 0.2500 mg | ORAL_TABLET | Freq: Every day | ORAL | 0 refills | Status: DC

## 2016-02-02 NOTE — Patient Instructions (Signed)
Please remember to schedule your eye exam. 

## 2016-02-02 NOTE — Progress Notes (Addendum)
Subjective:    CC: DM  HPI: Diabetes - no hypoglycemic events. No wounds or sores that are not healing well. No increased thirst or urination. Checking glucose at home. Taking medications as prescribed without any side effects.  She feels like she has restless leg. She says she is sitting for long. Watch TV or when going to bed she'll feel like she has to move her legs. She says it's bothersome but not painful. She like to do something about it and was specifically asking about ropinirole. She was hoping it may help her tremor as well.  Past medical history, Surgical history, Family history not pertinant except as noted below, Social history, Allergies, and medications have been entered into the medical record, reviewed, and corrections made.   Review of Systems: No fevers, chills, night sweats, weight loss, chest pain, or shortness of breath.   Objective:    General: Well Developed, well nourished, and in no acute distress.  Neuro: Alert and oriented x3, extra-ocular muscles intact, sensation grossly intact.  HEENT: Normocephalic, atraumatic  Skin: Warm and dry, no rashes. Cardiac: Regular rate and rhythm, no murmurs rubs or gallops, no lower extremity edema.  Respiratory: Clear to auscultation bilaterally. Not using accessory muscles, speaking in full sentences.   Impression and Recommendations:    DM- Well-controlled hemoglobin A1c jumped up significantly from 6.0-6.7. Will increase metformin to twice a day. Discussed getting back on track with diet and activity. She's gained about 9 pounds in the last year. check CMP.   Lab Results  Component Value Date   HGBA1C 6.7 02/02/2016   Restless leg syndrome-we'll start ropinirole 0.25 mg. Can increase to 2 tabs if needed. Follow-up in 3 months.  Check for iron def.

## 2016-02-03 LAB — COMPLETE METABOLIC PANEL WITH GFR
ALT: 15 U/L (ref 6–29)
AST: 21 U/L (ref 10–35)
Albumin: 4.4 g/dL (ref 3.6–5.1)
Alkaline Phosphatase: 61 U/L (ref 33–130)
BUN: 16 mg/dL (ref 7–25)
CO2: 26 mmol/L (ref 20–31)
Calcium: 9.5 mg/dL (ref 8.6–10.4)
Chloride: 106 mmol/L (ref 98–110)
Creat: 0.74 mg/dL (ref 0.60–0.93)
GFR, Est African American: >89 mL/min (ref >=60)
GFR, Est Non African American: 82 mL/min (ref >=60)
Glucose, Bld: 90 mg/dL (ref 65–99)
Potassium: 4.4 mmol/L (ref 3.5–5.3)
Sodium: 140 mmol/L (ref 135–146)
Total Bilirubin: 0.7 mg/dL (ref 0.2–1.2)
Total Protein: 7.1 g/dL (ref 6.1–8.1)

## 2016-02-03 LAB — FERRITIN: Ferritin: 25 ng/mL (ref 20–288)

## 2016-02-03 LAB — HEMOGLOBIN: Hemoglobin: 14.9 g/dL (ref 11.7–15.5)

## 2016-05-04 ENCOUNTER — Ambulatory Visit (INDEPENDENT_AMBULATORY_CARE_PROVIDER_SITE_OTHER): Payer: Medicare Other | Admitting: Family Medicine

## 2016-05-04 ENCOUNTER — Encounter: Payer: Self-pay | Admitting: Family Medicine

## 2016-05-04 ENCOUNTER — Ambulatory Visit: Payer: Medicare Other | Admitting: *Deleted

## 2016-05-04 ENCOUNTER — Encounter: Payer: Self-pay | Admitting: *Deleted

## 2016-05-04 VITALS — BP 122/80 | HR 86 | Temp 97.9°F | Ht 64.0 in | Wt 209.6 lb

## 2016-05-04 VITALS — BP 122/80 | HR 86 | Ht 64.0 in | Wt 209.0 lb

## 2016-05-04 DIAGNOSIS — G2581 Restless legs syndrome: Secondary | ICD-10-CM | POA: Diagnosis not present

## 2016-05-04 DIAGNOSIS — E669 Obesity, unspecified: Secondary | ICD-10-CM

## 2016-05-04 DIAGNOSIS — E119 Type 2 diabetes mellitus without complications: Secondary | ICD-10-CM

## 2016-05-04 DIAGNOSIS — Z0000 Encounter for general adult medical examination without abnormal findings: Secondary | ICD-10-CM

## 2016-05-04 LAB — POCT GLYCOSYLATED HEMOGLOBIN (HGB A1C): Hemoglobin A1C: 6.2

## 2016-05-04 MED ORDER — ROPINIROLE HCL 0.25 MG PO TABS: 0.2500 mg | ORAL_TABLET | Freq: Every day | ORAL | 1 refills | Status: DC

## 2016-05-04 MED ORDER — METFORMIN HCL 500 MG PO TABS: 500.0000 mg | ORAL_TABLET | Freq: Two times a day (BID) | ORAL | 1 refills | Status: DC

## 2016-05-04 MED ORDER — ATORVASTATIN CALCIUM 40 MG PO TABS: 40.0000 mg | ORAL_TABLET | Freq: Every day | ORAL | 3 refills | Status: DC

## 2016-05-04 MED ORDER — PROPRANOLOL HCL 20 MG PO TABS: 20.0000 mg | ORAL_TABLET | Freq: Two times a day (BID) | ORAL | 1 refills | Status: DC

## 2016-05-04 NOTE — Progress Notes (Signed)
Subjective:   Paige Moore is a 71 y.o. female who presents for Medicare Annual (Subsequent) preventive examination.  Review of Systems:   Cardiac Risk Factors include: advanced age (>49mn, >>60women);diabetes mellitus;dyslipidemia;hypertension;obesity (BMI >30kg/m2);sedentary lifestyle     Objective:     Vitals: BP 122/80   Pulse 86   Temp 97.9 F (36.6 C) (Oral)   Ht 5' 4" (1.626 m)   Wt 209 lb 9.6 oz (95.1 kg)   SpO2 98%   BMI 35.98 kg/m   Body mass index is 35.98 kg/m.   Tobacco History  Smoking Status  . Former Smoker  . Packs/day: 0.25  . Years: 15.00  . Types: Cigarettes  . Quit date: 03/15/2006  Smokeless Tobacco  . Never Used     Counseling given: No   Past Medical History:  Diagnosis Date  . Essential tremor   . Hyperlipidemia   . OA (osteoarthritis)   . PONV (postoperative nausea and vomiting)   . Prediabetes   . VIN III (vulvar intraepithelial neoplasia III)   . Wears dentures    upper  . Wears glasses    Past Surgical History:  Procedure Laterality Date  . CESAREAN SECTION     X 2  . KNEE ARTHROSCOPY Left   . ORIF RIGHT ANKLE FX  1987   later hardware removed  . PLANTAR FASCIA RELEASE  12-02-2003   RIGHT HEEL  . TOTAL ABDOMINAL HYSTERECTOMY  age 71 . TOTAL HIP ARTHROPLASTY Left 7/ 2011    [facility]  . TOTAL KNEE ARTHROPLASTY Left 07-11-2010   [facility]  . VULVECTOMY Left 10/11/2013   Procedure: WIDE LOCAL EXCISON OF LEFT VULVA;  Surgeon: EEveritt Amber MD;  [facility]  Service: Gynecology;  Laterality: Left;  . WRIST GANGLION EXCISION Left 2001   Family History  Problem Relation Age of Onset  . Diabetes Father   . Cancer Sister     endometrial cancer  . Osteoporosis Mother   . Hypothyroidism Mother    History  Sexual Activity  . Sexual activity: Not Currently    Outpatient Encounter Prescriptions as of 05/04/2016  Medication Sig  . acetaminophen (TYLENOL) 500 MG tablet Take by mouth. 1-2 tabs po tid  prn   . Blood Glucose Monitoring Suppl (ONE TOUCH ULTRA SYSTEM KIT) W/DEVICE KIT 1 kit by Does not apply route once.  .Marland Kitchenglucose blood (ONE TOUCH ULTRA TEST) test strip USE ONE STRIP TO CHECK GLUCOSE ONCE DAILY  . ibuprofen (ADVIL,MOTRIN) 200 MG tablet Take 200 mg by mouth every 6 (six) hours as needed.  .Glory RosebushDELICA LANCETS 330ZMISC USE ONE TO CHECK BLOOD SUGAR ONCE DAILY  . [DISCONTINUED] atorvastatin (LIPITOR) 40 MG tablet Take 1 tablet (40 mg total) by mouth daily.  . [DISCONTINUED] metFORMIN (GLUCOPHAGE) 500 MG tablet Take 1 tablet (500 mg total) by mouth 2 (two) times daily with a meal.  . [DISCONTINUED] propranolol (INDERAL) 20 MG tablet Take 1 tablet (20 mg total) by mouth 2 (two) times daily.  . [DISCONTINUED] rOPINIRole (REQUIP) 0.25 MG tablet Take 1-2 tablets (0.25-0.5 mg total) by mouth at bedtime.  . docusate sodium (COLACE) 100 MG capsule Take 1 capsule (100 mg total) by mouth 2 (two) times daily.   No facility-administered encounter medications on file as of 05/04/2016.     Activities of Daily Living In your present state of health, do you have any difficulty performing the following activities: 05/04/2016  Hearing? N  Vision? N  Difficulty  concentrating or making decisions? Y  Walking or climbing stairs? N  Dressing or bathing? N  Doing errands, shopping? N  Preparing Food and eating ? N  Using the Toilet? N  In the past six months, have you accidently leaked urine? N  Do you have problems with loss of bowel control? N  Managing your Medications? N  Managing your Finances? Y  Housekeeping or managing your Housekeeping? N  Some recent data might be hidden    Patient Care Team: Hali Marry, MD as PCP - General (Family Medicine)    Assessment:     Exercise Activities and Dietary recommendations Current Exercise Habits: The patient does not participate in regular exercise at present, Exercise limited by: neurologic condition(s) (Has Parkinson's Disease  but tremors are mild.)  Goals    . Exercise 3x per week (30 min per time) (pt-stated)          She would like to walk for at least 20 minutes for 3-4 times per week.      Fall Risk Fall Risk  05/04/2016 10/01/2015 05/14/2014 04/13/2013  Falls in the past year? No No No No   Depression Screen PHQ 2/9 Scores 05/04/2016 10/01/2015 05/14/2014 04/13/2013  PHQ - 2 Score 0 0 0 0     Cognitive Function-Normal cognitive function-6CIT score 0.     6CIT Screen 05/04/2016  What Year? 0 points  What month? 0 points  What time? 0 points  Count back from 20 0 points  Repeat phrase 0 points    Immunization History  Administered Date(s) Administered  . Influenza Split 12/16/2010, 12/27/2011  . Influenza Whole 12/27/2005, 01/31/2007, 12/22/2009  . Influenza, High Dose Seasonal PF 12/19/2015  . Influenza,inj,Quad PF,36+ Mos 01/08/2013, 12/13/2013, 12/24/2014  . Pneumococcal Conjugate-13 09/23/2014  . Pneumococcal Polysaccharide-23 06/23/2011  . Zoster 04/15/2012   Screening Tests Health Maintenance  Topic Date Due  . OPHTHALMOLOGY EXAM  11/01/2016 (Originally 07/22/2015)  . FOOT EXAM  07/01/2016  . URINE MICROALBUMIN  07/01/2016  . MAMMOGRAM  07/16/2016  . HEMOGLOBIN A1C  08/01/2016  . TETANUS/TDAP  03/15/2017  . COLONOSCOPY  02/16/2021  . INFLUENZA VACCINE  Completed  . DEXA SCAN  Completed  . Hepatitis C Screening  Completed  . PNA vac Low Risk Adult  Completed      Plan:    I have personally reviewed and addressed the Medicare Annual Wellness questionnaire and have noted the following in the patient's chart:  A. Medical and social history B. Use of alcohol, tobacco or illicit drugs  C. Current medications and supplements D. Functional ability and status-Discussed.  Sister helps with financial decision making and taking care of some bills. E.  Nutritional status F.  Physical activity-Discussed.  Goal set. G. Advance directives-Discussed to bring a copy. H. List of other  physicians I.  Hospitalizations, surgeries, and ER visits in previous 12 months J.  Vitals K. Screenings to include Cognitive, depression-Normal cognitive screen by 6CIT.  No evidence of depression noted.  Depression screen WNL. L. Referrals and appointments - Advised to make DM eye exam and mammogram, if Dr. Madilyn Fireman advises since it will be due in May of this year.    In addition, I have reviewed and discussed with patient certain preventive protocols, quality metrics such as her BMI being at 35.98, and best practice recommendations. A written personalized care plan for preventive services as well as general preventive health recommendations were provided to patient.  Signed,   Nestor Lewandowsky RN

## 2016-05-04 NOTE — Progress Notes (Signed)
Reviewed and agree with below Catherine Metheney, MD  

## 2016-05-04 NOTE — Progress Notes (Signed)
Subjective:    CC: DM, RLS  HPI:  Diabetes - no hypoglycemic events. No wounds or sores that are not healing well. No increased thirst or urination. Checking glucose at home. Taking medications as prescribed without any side effects.We increased her metformin to twice a day when I saw her back last time.  RLS - restart her on ropinirole 0.25 mg at bedtime 3 months ago. We did rule out iron deficiency anemia. She says the medication worked really well. She said it helped calm her legs down and she slept better. She said she did not have any negative side effects with the medication.   Past medical history, Surgical history, Family history not pertinant except as noted below, Social history, Allergies, and medications have been entered into the medical record, reviewed, and corrections made.   Review of Systems: No fevers, chills, night sweats, weight loss, chest pain, or shortness of breath.   Objective:    General: Well Developed, well nourished, and in no acute distress.  Neuro: Alert and oriented x3, extra-ocular muscles intact, sensation grossly intact.  HEENT: Normocephalic, atraumatic  Skin: Warm and dry, no rashes. Cardiac: Regular rate and rhythm, no murmurs rubs or gallops, no lower extremity edema.  Respiratory: Clear to auscultation bilaterally. Not using accessory muscles, speaking in full sentences. MSK: tremor of right hand at rest.    Impression and Recommendations:    DM- Well controlled. Continue current regimen. Follow up in  4 months. Continue to encourage regular exercise and weight loss. Lab Results  Component Value Date   HGBA1C 6.7 02/02/2016   RLS -  well controlled on current regimen. Will continue with Requip. Will refill prescription for 90 day.

## 2016-05-04 NOTE — Patient Instructions (Signed)
Health maintenance:You are due for your diabetic eye exam.  Please make this appt. You will be due for your mammogram in May.  If Dr.Metheney wants you to have this, please make this appt. This can be done at this building on the first floor.  Your hemoglobin A1-C will be due in May of this year  You will need to fast for this test.   Abnormal screenings: None identified.   Patient concerns: You should make mention to Dr. Madilyn Fireman that you need to have all of your medications refilled, as you mentioned.   Nurse concerns: Be sure to check your feet for any sores or changes.  Please get your eyes examined.     Next PCP appt: You not do see Paige Moore for your next Annual Wellness Visit until this time next year.  Follow up with Dr. Madilyn Fireman for your next appt upon her recommendation as you leave today. Mammogram Introduction A mammogram is an X-ray of the breasts that is done to check for changes that are not normal. This test can screen for and find any changes that may suggest breast cancer. This test can also help to find other changes and variations in the breast. What happens before the procedure?  Have this test done about 1-2 weeks after your period. This is usually when your breasts are the least tender.  If you are visiting a new doctor or clinic, send any past mammogram images to your new doctor's office.  Wash your breasts and under your arms the day of the test.  Do not use deodorants, perfumes, lotions, or powders on the day of the test.  Take off any jewelry from your neck.  Wear clothes that you can change into and out of easily. What happens during the procedure?  You will undress from the waist up. You will put on a gown.  You will stand in front of the X-ray machine.  Each breast will be placed between two plastic or glass plates. The plates will press down on your breast for a few seconds. Try to stay as relaxed as possible. This does not cause any harm to your breasts.  Any discomfort you feel will be very brief.  X-rays will be taken from different angles of each breast. The procedure may vary among doctors and hospitals. What happens after the procedure?  The mammogram will be looked at by a specialist (radiologist).  You may need to do certain parts of the test again. This depends on the quality of the images.  Ask when your test results will be ready. Make sure you get your test results.  You may go back to your normal activities. This information is not intended to replace advice given to you by your health care provider. Make sure you discuss any questions you have with your health care provider. Document Released: 05/28/2008 Document Revised: 08/07/2015 Document Reviewed: 05/10/2014  2017 Elsevier  Hemoglobin A1c Test Some of the sugar (glucose) that circulates in your blood sticks or binds to blood proteins. Hemoglobin (Hb or Hgb) is one type of blood protein that glucose binds to. It also carries oxygen in the red blood cells (RBCs). When glucose binds to Hb, the glucose-coated Hb is called glycated Hb. Once Hb is glycated, it remains that way for the life of the RBC. This is about 120 days. Rather than testing your blood glucose level on one single day, the hemoglobin A1c (HbA1c) test measures the average amount of glycated hemoglobin and, therefore,  the average amount of glucose in your blood during the 3-4 months just before the test is done. The HbA1c test is used to monitor long-term control of blood sugar in people who have diabetes mellitus. The HbA1c test can also be used in addition to or in combination with fasting blood glucose level and oral glucose tolerance tests. What do the results mean?   It is your responsibility to obtain your test results. Ask the lab or department performing the test when and how you will get your results. Contact your health care provider to discuss any questions you have about your results. Range of Normal  Values  Ranges for normal values may vary among different labs and hospitals. You should always check with your health care provider after having lab work or other tests done to discuss the meaning of your test results and whether your values are considered within normal limits. The ranges for normal HbA1c test results are as follows:  Adult or child without diabetes: 4-5.7%.  Adult or child with diabetes and good blood glucose control: less than 7%. Several factors can affect HbA1c test results. These may include:  Diseases (hemoglobinopathies) that cause a change in the shape, size, or amount of Hb in your blood.  Longer than normal RBC life span.  Abnormally low levels of certain proteins in your blood.  Eating foods or taking supplements that are high in vitamin C (ascorbic acid). Meaning of Results Outside Normal Value Ranges  Abnormally high HbA1c values are most commonly an indication of prediabetes mellitus and diabetes mellitus:  An HbA1c result of 5.7-6.4% is considered diagnostic of prediabetes mellitus.  An HbA1c result of 6.5% or higher on two separate occasions is considered diagnostic of diabetes mellitus. Abnormally low HbA1c values can be caused by several health conditions. These may include:  Pregnancy.  A large amount of blood loss.  Blood transfusions.  Low red blood cell count (anemia). This is caused by premature destruction of red blood cells.  Long-term kidney failure.  Some unusual forms of Hb (Hb variants), such as sickle cell trait. Discuss your test results with your health care provider. He or she will use the results to make a diagnosis and determine a treatment plan that is right for you. Talk with your health care provider to discuss your results, treatment options, and if necessary, the need for more tests. Talk with your health care provider if you have any questions about your results. This information is not intended to replace advice given to  you by your health care provider. Make sure you discuss any questions you have with your health care provider. Document Released: 03/23/2004 Document Revised: 11/26/2015 Document Reviewed: 07/16/2013 Elsevier Interactive Patient Education  2017 [employer].

## 2016-07-20 DIAGNOSIS — E119 Type 2 diabetes mellitus without complications: Secondary | ICD-10-CM | POA: Diagnosis not present

## 2016-07-20 LAB — HM DIABETES EYE EXAM

## 2016-07-21 ENCOUNTER — Other Ambulatory Visit: Payer: Self-pay | Admitting: Family Medicine

## 2016-07-21 DIAGNOSIS — Z1231 Encounter for screening mammogram for malignant neoplasm of breast: Secondary | ICD-10-CM

## 2016-07-27 ENCOUNTER — Encounter: Payer: Self-pay | Admitting: Family Medicine

## 2016-07-30 ENCOUNTER — Ambulatory Visit (INDEPENDENT_AMBULATORY_CARE_PROVIDER_SITE_OTHER): Payer: Medicare Other

## 2016-07-30 DIAGNOSIS — Z1231 Encounter for screening mammogram for malignant neoplasm of breast: Secondary | ICD-10-CM

## 2016-08-18 ENCOUNTER — Other Ambulatory Visit: Payer: Self-pay | Admitting: Family Medicine

## 2016-08-19 ENCOUNTER — Other Ambulatory Visit: Payer: Self-pay

## 2016-08-19 DIAGNOSIS — E119 Type 2 diabetes mellitus without complications: Secondary | ICD-10-CM

## 2016-08-19 MED ORDER — GLUCOSE BLOOD VI STRP: ORAL_STRIP | 5 refills | Status: DC

## 2016-08-19 MED ORDER — ONETOUCH DELICA LANCETS 33G MISC: 5 refills | Status: DC

## 2016-09-01 ENCOUNTER — Ambulatory Visit: Payer: Self-pay | Admitting: Family Medicine

## 2016-09-08 ENCOUNTER — Telehealth: Payer: Self-pay | Admitting: Family Medicine

## 2016-09-08 ENCOUNTER — Ambulatory Visit (INDEPENDENT_AMBULATORY_CARE_PROVIDER_SITE_OTHER): Payer: Medicare Other | Admitting: Family Medicine

## 2016-09-08 ENCOUNTER — Encounter: Payer: Self-pay | Admitting: Family Medicine

## 2016-09-08 VITALS — BP 129/85 | HR 87 | Ht 64.0 in | Wt 209.0 lb

## 2016-09-08 DIAGNOSIS — E119 Type 2 diabetes mellitus without complications: Secondary | ICD-10-CM

## 2016-09-08 DIAGNOSIS — R61 Generalized hyperhidrosis: Secondary | ICD-10-CM

## 2016-09-08 DIAGNOSIS — R259 Unspecified abnormal involuntary movements: Secondary | ICD-10-CM

## 2016-09-08 DIAGNOSIS — G252 Other specified forms of tremor: Secondary | ICD-10-CM

## 2016-09-08 DIAGNOSIS — T887XXA Unspecified adverse effect of drug or medicament, initial encounter: Secondary | ICD-10-CM

## 2016-09-08 DIAGNOSIS — G2581 Restless legs syndrome: Secondary | ICD-10-CM | POA: Diagnosis not present

## 2016-09-08 DIAGNOSIS — L989 Disorder of the skin and subcutaneous tissue, unspecified: Secondary | ICD-10-CM | POA: Diagnosis not present

## 2016-09-08 LAB — POCT UA - MICROALBUMIN
Albumin/Creatinine Ratio, Urine, POC: <30
Creatinine, POC: 50 mg/dL
Microalbumin Ur, POC: 10 mg/L

## 2016-09-08 LAB — POCT GLYCOSYLATED HEMOGLOBIN (HGB A1C): Hemoglobin A1C: 6

## 2016-09-08 MED ORDER — PROPRANOLOL HCL 20 MG PO TABS: 20.0000 mg | ORAL_TABLET | Freq: Two times a day (BID) | ORAL | 1 refills | Status: DC

## 2016-09-08 MED ORDER — METFORMIN HCL 500 MG PO TABS: 500.0000 mg | ORAL_TABLET | Freq: Two times a day (BID) | ORAL | 1 refills | Status: DC

## 2016-09-08 NOTE — Telephone Encounter (Signed)
Call pt: I did look up her medication and the metformin can make her sweat.

## 2016-09-08 NOTE — Progress Notes (Signed)
Subjective:    CC: DM  HPI: Diabetes - no hypoglycemic events. No wounds or sores that are not healing well. No increased thirst or urination. Checking glucose at home. Taking medications as prescribed without any side effects.  Restless leg syndrome-doing really well on her ropinirole. She does feel like it's effective and has not had any side effects.  She did notice a lesion on the dorsum of the right wrist. She says it's not painful irritating are otherwise distressing but she just noticed that there may be about 2 weeks ago. It just feels like a little bump underneath the skin and has a little bit of erythema around it.  Tremor-currently on propranolol. She says she has been sweating a lot recently and wants to know that the medication could be causing that side effect.  Past medical history, Surgical history, Family history not pertinant except as noted below, Social history, Allergies, and medications have been entered into the medical record, reviewed, and corrections made.   Review of Systems: No fevers, chills, night sweats, weight loss, chest pain, or shortness of breath.   Objective:    General: Well Developed, well nourished, and in no acute distress.  Neuro: Alert and oriented x3, extra-ocular muscles intact, sensation grossly intact.  HEENT: Normocephalic, atraumatic  Skin: Warm and dry, no rashes. Cardiac: Regular rate and rhythm, no murmurs rubs or gallops, no lower extremity edema.  Respiratory: Clear to auscultation bilaterally. Not using accessory muscles, speaking in full sentences.   Impression and Recommendations:   DM-  Well controlled. Looks fantastic continue current regimen. Follow-up in 3-4 months.  Negative urine microalbumin today. On statin daily. He wants to wait and get her blood work next time so she can come in fasting. Lab Results  Component Value Date   HGBA1C 6.0 09/08/2016   Restless leg syndrome-continue ropinirole.  Skin lesion-unclear  etiology. Almost feels like a epidermal cyst but I do not see an enlarged pore in the center. It doesn't appear like a skin growth per se like a squamous cell or basal cell. Encouraged her to keep an eye on it over the next couple weeks and let me know if it's not resolving on its own. She also had 2 smaller erythematous papular lesions on her right upper arm and right elbow. She actually thinks those were mosquito bites. Again keep an eye on these and affect are not healing them please let me know in about 2 weeks.  Tremor - continue with propranolol. I reviewed the side effect profile it does not cause excess sweating.  Medication side effect-I did investigate and the metformin does cause diaphoresis in 1-10% of patients.

## 2016-09-08 NOTE — Telephone Encounter (Signed)
Pt.notified

## 2016-09-08 NOTE — Patient Instructions (Signed)
Keep an eye on the skin lesions. If not healing after 2 weeks then please let me know.

## 2016-12-06 ENCOUNTER — Ambulatory Visit: Payer: Self-pay | Admitting: Family Medicine

## 2016-12-09 ENCOUNTER — Encounter: Payer: Self-pay | Admitting: Family Medicine

## 2016-12-09 ENCOUNTER — Ambulatory Visit (INDEPENDENT_AMBULATORY_CARE_PROVIDER_SITE_OTHER): Payer: Medicare Other | Admitting: Family Medicine

## 2016-12-09 VITALS — BP 130/79 | HR 77 | Wt 209.0 lb

## 2016-12-09 DIAGNOSIS — Z23 Encounter for immunization: Secondary | ICD-10-CM

## 2016-12-09 DIAGNOSIS — G2581 Restless legs syndrome: Secondary | ICD-10-CM | POA: Diagnosis not present

## 2016-12-09 DIAGNOSIS — Z8349 Family history of other endocrine, nutritional and metabolic diseases: Secondary | ICD-10-CM | POA: Diagnosis not present

## 2016-12-09 DIAGNOSIS — E119 Type 2 diabetes mellitus without complications: Secondary | ICD-10-CM

## 2016-12-09 LAB — COMPLETE METABOLIC PANEL WITH GFR
AG Ratio: 1.4 (calc) (ref 1.0–2.5)
ALT: 14 U/L (ref 6–29)
AST: 21 U/L (ref 10–35)
Albumin: 4.1 g/dL (ref 3.6–5.1)
Alkaline phosphatase (APISO): 71 U/L (ref 33–130)
BUN: 14 mg/dL (ref 7–25)
CO2: 25 mmol/L (ref 20–32)
Calcium: 9.3 mg/dL (ref 8.6–10.4)
Chloride: 105 mmol/L (ref 98–110)
Creat: 0.81 mg/dL (ref 0.60–0.93)
GFR, Est African American: 85 mL/min/{1.73_m2} (ref >=60)
GFR, Est Non African American: 73 mL/min/{1.73_m2} (ref >=60)
Globulin: 3 g/dL (calc) (ref 1.9–3.7)
Glucose, Bld: 101 mg/dL — ABNORMAL HIGH (ref 65–99)
Potassium: 4.2 mmol/L (ref 3.5–5.3)
Sodium: 141 mmol/L (ref 135–146)
Total Bilirubin: 0.7 mg/dL (ref 0.2–1.2)
Total Protein: 7.1 g/dL (ref 6.1–8.1)

## 2016-12-09 LAB — POCT GLYCOSYLATED HEMOGLOBIN (HGB A1C): Hemoglobin A1C: 6.2

## 2016-12-09 LAB — LIPID PANEL
Cholesterol: 133 mg/dL (ref <200)
HDL: 58 mg/dL (ref >50)
LDL Cholesterol (Calc): 56 mg/dL (calc)
Non-HDL Cholesterol (Calc): 75 mg/dL (calc) (ref <130)
Total CHOL/HDL Ratio: 2.3 (calc) (ref <5.0)
Triglycerides: 109 mg/dL (ref <150)

## 2016-12-09 LAB — TSH: TSH: 1.51 mIU/L (ref 0.40–4.50)

## 2016-12-09 NOTE — Progress Notes (Signed)
Subjective:    CC: DM, RLS  HPI: Diabetes - no hypoglycemic events. No wounds or sores that are not healing well. No increased thirst or urination. Checking glucose at home. Taking medications as prescribed without any side effects.She does try to walk 3-4 days per week and maintain a healthy weight.   RLS - she says she was unable to take the Requip. She started experiencing left foot swelling in a senior she stopped the medication and resolved pretty quickly. She says she might be interested in taking something different but wants to wait and discuss it at the next office visit.  She would like to have her thyroid rechecked. Her mother has hypothyroid. Last time we checked her was about 4 years ago and at the time her TSH looked great at 1.6. She's not rinsing any specific symptoms. In fact her weight has been stable over the last 2 years. No recent skin or hair changes.    Past medical history, Surgical history, Family history not pertinant except as noted below, Social history, Allergies, and medications have been entered into the medical record, reviewed, and corrections made.   Review of Systems: No fevers, chills, night sweats, weight loss, chest pain, or shortness of breath.   Objective:    General: Well Developed, well nourished, and in no acute distress.  Neuro: Alert and oriented x3, extra-ocular muscles intact, sensation grossly intact.  HEENT: Normocephalic, atraumatic  Skin: Warm and dry, no rashes. Cardiac: Regular rate and rhythm, no murmurs rubs or gallops, no lower extremity edema.  Respiratory: Clear to auscultation bilaterally. Not using accessory muscles, speaking in full sentences.   Impression and Recommendations:    DM- Well controlled overall. Hemoglobin A1c 6.2 today which is up just slightly from last time at 6.0. Continue current regimen with metformin. She is on a statin. Overall she is doing a great job. Continue to work on [employer] and continue with  regular exercise. Due for CMP and lipid panel.  RLS - added Requip to intolerance list. It's unusual since it really only call 1 foot to swell and it wasn't bilateral. Certainly we can consider alternative treatment options if she would like to try something else in the future.  Family history of hypothyroidism-we'll recheck TSH today. In addition there is actually an increased incidence of developing thyroid disorder in patients who have been diabetic for longer periods of time.  Flu shot given.

## 2017-04-07 ENCOUNTER — Ambulatory Visit: Payer: Self-pay | Admitting: Family Medicine

## 2017-04-13 ENCOUNTER — Ambulatory Visit: Payer: Self-pay | Admitting: Family Medicine

## 2017-04-19 ENCOUNTER — Ambulatory Visit (INDEPENDENT_AMBULATORY_CARE_PROVIDER_SITE_OTHER): Payer: Medicare Other | Admitting: Family Medicine

## 2017-04-19 ENCOUNTER — Encounter: Payer: Self-pay | Admitting: Family Medicine

## 2017-04-19 VITALS — BP 137/71 | HR 79 | Wt 203.0 lb

## 2017-04-19 DIAGNOSIS — E119 Type 2 diabetes mellitus without complications: Secondary | ICD-10-CM

## 2017-04-19 DIAGNOSIS — R251 Tremor, unspecified: Secondary | ICD-10-CM

## 2017-04-19 LAB — POCT GLYCOSYLATED HEMOGLOBIN (HGB A1C): Hemoglobin A1C: 5.9

## 2017-04-19 MED ORDER — AMBULATORY NON FORMULARY MEDICATION: 0 refills | Status: DC

## 2017-04-19 MED ORDER — METFORMIN HCL 500 MG PO TABS: 500.0000 mg | ORAL_TABLET | Freq: Two times a day (BID) | ORAL | 1 refills | Status: DC

## 2017-04-19 MED ORDER — ATORVASTATIN CALCIUM 40 MG PO TABS: 40.0000 mg | ORAL_TABLET | Freq: Every day | ORAL | 3 refills | Status: DC

## 2017-04-19 MED ORDER — VERAPAMIL HCL ER 120 MG PO TBCR: 120.0000 mg | EXTENDED_RELEASE_TABLET | Freq: Every day | ORAL | 0 refills | Status: DC

## 2017-04-19 NOTE — Patient Instructions (Signed)
Hold your propranolol and we will try verapamil.

## 2017-04-19 NOTE — Progress Notes (Signed)
Subjective:    CC: DM  HPI:  Diabetes - no hypoglycemic events. No wounds or sores that are not healing well. No increased thirst or urination. Checking glucose at home. Taking medications as prescribed without any side effects.   Tremor-she is currently on propranolol but says she talked to a friend of hers who takes verapamil for her tremor and does really well.  She would like to try switching the medication for a month to see if it is more effective for her.  Past medical history, Surgical history, Family history not pertinant except as noted below, Social history, Allergies, and medications have been entered into the medical record, reviewed, and corrections made.   Review of Systems: No fevers, chills, night sweats, weight loss, chest pain, or shortness of breath.   Objective:    General: Well Developed, well nourished, and in no acute distress.  Neuro: Alert and oriented x3, extra-ocular muscles intact, sensation grossly intact.  HEENT: Normocephalic, atraumatic  Skin: Warm and dry, no rashes. Cardiac: Regular rate and rhythm, no murmurs rubs or gallops, no lower extremity edema.  Respiratory: Clear to auscultation bilaterally. Not using accessory muscles, speaking in full sentences.   Impression and Recommendations:    DM - Well controlled. Continue current regimen. Follow up in  4 moths.l  Lab Results  Component Value Date   HGBA1C 5.9 04/19/2017   Tremor-most consistent with Parkinson's disease that she actually has some significant cogwheeling and stiffness in that right arm.  She has a classic pill-rolling tremor as well.  She would like to try verapamil for a month which I think is perfectly reasonable.  If no significant improvement then at that point even consider putting her on Sinemet.  Given Rx for Tdap. She really thinks she had it 7 years ago and wants to hold off for now.

## 2017-05-17 ENCOUNTER — Encounter: Payer: Self-pay | Admitting: Family Medicine

## 2017-05-17 ENCOUNTER — Ambulatory Visit (INDEPENDENT_AMBULATORY_CARE_PROVIDER_SITE_OTHER): Payer: Medicare Other | Admitting: Family Medicine

## 2017-05-17 VITALS — BP 136/79 | HR 100 | Ht 64.0 in | Wt 209.0 lb

## 2017-05-17 DIAGNOSIS — M20001 Unspecified deformity of right finger(s): Secondary | ICD-10-CM | POA: Diagnosis not present

## 2017-05-17 DIAGNOSIS — R251 Tremor, unspecified: Secondary | ICD-10-CM | POA: Diagnosis not present

## 2017-05-17 MED ORDER — CARBIDOPA-LEVODOPA 10-100 MG PO TABS: 1.0000 | ORAL_TABLET | Freq: Three times a day (TID) | ORAL | 1 refills | Status: DC

## 2017-05-17 NOTE — Patient Instructions (Addendum)
Your tremor is most consistant with a Parkinson's tremor.  It is a classic "pill-rolling" tremor.   We are going to try a drug called Sinemet.  It is 3 times a day.

## 2017-05-17 NOTE — Progress Notes (Signed)
   Subjective:    Patient ID: Paige Moore, female    DOB: 03-22-1945, 72 y.o.   MRN: [id number]  HPI   Follow-up on tremor today.  She was previously on propranolol but wanted to try switching to verapamil which a friend was taking for her tremor.  He feels like the verapamil has not been very effective in controlling her tremor. She says she can no longer write with that hand and she is right handed.  Also has some difficulty sometimes turning the key to start the engine on her car.  No memory changes.      Review of Systems     Objective:   Physical Exam  Constitutional: She is oriented to person, place, and time. She appears well-developed and well-nourished.  HENT:  Head: Normocephalic and atraumatic.  Eyes: Conjunctivae and EOM are normal.  Cardiovascular: Normal rate.  Pulmonary/Chest: Effort normal.  Musculoskeletal:  Right hand with a resting pill-rolling tremor.  She also has cogwheeling in the right arm.  It is absent in the left.  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  Neurological: She is alert and oriented to person, place, and time.  Skin: Skin is dry. No pallor.  Psychiatric: She has a normal mood and affect. Her behavior is normal.  Vitals reviewed.         Assessment & Plan:  Tremor-most consistent with Parkinson's disease versus an essential tremor.  Has not responded to propranolol or verapamil.  We will try Sinemet.  We will start with a low dose and then I will see her back in 6 weeks and we can adjust as needed.  Finger injury to fifth digit on right hand-at this point she does not really want to do a lot about it but if she does decide she wants treatment recommend that she see an orthopedist that she feels like the contracture is getting a little worse.

## 2017-06-28 ENCOUNTER — Ambulatory Visit (INDEPENDENT_AMBULATORY_CARE_PROVIDER_SITE_OTHER): Payer: Medicare Other | Admitting: Family Medicine

## 2017-06-28 ENCOUNTER — Encounter: Payer: Self-pay | Admitting: Family Medicine

## 2017-06-28 ENCOUNTER — Ambulatory Visit (INDEPENDENT_AMBULATORY_CARE_PROVIDER_SITE_OTHER): Payer: Medicare Other

## 2017-06-28 VITALS — BP 125/76 | HR 103 | Ht 64.0 in | Wt 211.0 lb

## 2017-06-28 DIAGNOSIS — M20001 Unspecified deformity of right finger(s): Secondary | ICD-10-CM

## 2017-06-28 DIAGNOSIS — M24541 Contracture, right hand: Secondary | ICD-10-CM

## 2017-06-28 DIAGNOSIS — R251 Tremor, unspecified: Secondary | ICD-10-CM

## 2017-06-28 DIAGNOSIS — G20 Parkinson's disease: Secondary | ICD-10-CM | POA: Diagnosis not present

## 2017-06-28 DIAGNOSIS — M79644 Pain in right finger(s): Secondary | ICD-10-CM

## 2017-06-28 DIAGNOSIS — S63206A Unspecified subluxation of right little finger, initial encounter: Secondary | ICD-10-CM | POA: Diagnosis not present

## 2017-06-28 NOTE — Progress Notes (Signed)
Subjective:    CC: Tremor  HPI:  Tremor -she was previously on propranolol but not have a lot of success in controlling her tremors so we switched her to verapamil.  Unfortunately the verapamil did not help either so we switched her to Sinemet as I was suspicious that she likely actually has Parkinson's disease.  She wanted to try the verapamil because a friend was taking it.  Says the medication has been really helpful.  She is actually been sleeping a lot better instead of being woken up by the tremor.  She feels like it is helping to control it.  She will sometimes skip a dose during the day if she is going to drive because she says it can make her feel a little lightheaded sometimes especially if she takes it without food.   Finger injury to fifth digit on right hand-She has bumped the finger and the swelling is actually been getting a little bit worse.  She had mentioned it at last office visit but did not want to do any further workup at that time.  She is not sure if she may have broken it at some point.  She has had some mild contracture to that finger for quite some time.  But it has only become more painful recently.  She says she is vomited several times but does not remember a severe injury.   Past medical history, Surgical history, Family history not pertinant except as noted below, Social history, Allergies, and medications have been entered into the medical record, reviewed, and corrections made.   Review of Systems: No fevers, chills, night sweats, weight loss, chest pain, or shortness of breath.   Objective:    General: Well Developed, well nourished, and in no acute distress.  Neuro: Alert and oriented x3, extra-ocular muscles intact, sensation grossly intact.  HEENT: Normocephalic, atraumatic  Skin: Warm and dry, no rashes. Cardiac: Regular rate and rhythm, no murmurs rubs or gallops, no lower extremity edema.  Respiratory: Clear to auscultation bilaterally. Not using  accessory muscles, speaking in full sentences.   Impression and Recommendations:   Parkinson's disease -doing well on the Sinemet.  Continue current regimen.  Keep follow-up in June for diabetes and we can reassess at that time if we need to adjust her dose.   injury to fifth digit on right hand-at this point her contracture is getting worse and she feels like it is more tender painful and burning that it was previously.  We will go ahead and get x-ray today.  Call with results once available.  I likely have sports medicine look over it with me.

## 2017-07-07 ENCOUNTER — Ambulatory Visit (INDEPENDENT_AMBULATORY_CARE_PROVIDER_SITE_OTHER): Payer: Medicare Other | Admitting: Sports Medicine

## 2017-07-07 ENCOUNTER — Ambulatory Visit (INDEPENDENT_AMBULATORY_CARE_PROVIDER_SITE_OTHER): Payer: Medicare Other

## 2017-07-07 ENCOUNTER — Encounter: Payer: Self-pay | Admitting: Sports Medicine

## 2017-07-07 DIAGNOSIS — S63226D Subluxation of unspecified interphalangeal joint of right little finger, subsequent encounter: Secondary | ICD-10-CM | POA: Diagnosis not present

## 2017-07-07 DIAGNOSIS — M24549 Contracture, unspecified hand: Secondary | ICD-10-CM | POA: Insufficient documentation

## 2017-07-07 DIAGNOSIS — S63226A Subluxation of unspecified interphalangeal joint of right little finger, initial encounter: Secondary | ICD-10-CM | POA: Diagnosis not present

## 2017-07-07 DIAGNOSIS — S63287A Dislocation of proximal interphalangeal joint of left little finger, initial encounter: Secondary | ICD-10-CM | POA: Diagnosis not present

## 2017-07-07 DIAGNOSIS — X58XXXD Exposure to other specified factors, subsequent encounter: Secondary | ICD-10-CM

## 2017-07-07 MED ORDER — TRAMADOL HCL 50 MG PO TABS: 50.0000 mg | ORAL_TABLET | Freq: Three times a day (TID) | ORAL | 0 refills | Status: DC | PRN

## 2017-07-07 NOTE — Assessment & Plan Note (Addendum)
Subacute vs chronic subluxation of the PIP. Digital block with gentle closed reduction, static volar splint placed Postreduction films. Tramadol for pain. Return to see me in 1 week.  I would like her to also get a second opinion from hand surgery, ultimately this is going to need an arthrodesis. She has difficulty with general anesthesia, I think she could be able to have this done with a digital block.

## 2017-07-07 NOTE — Progress Notes (Signed)
Subjective:    I'm seeing this patient as a consultation for: Dr. Beatrice Lecher  CC: Fifth finger issue  HPI: After injuring her finger this pleasant 72 year old female has developed a deformity with hyperextension at the PIP and flexion at the DIP consistent with a swan-neck deformity.  No history of rheumatoid arthritis.  It is moderately painful.  I reviewed the past medical history, family history, social history, surgical history, and allergies today and no changes were needed.  Please see the problem list section below in epic for further details.  Past Medical History: Past Medical History:  Diagnosis Date  . Essential tremor   . Hyperlipidemia   . OA (osteoarthritis)   . PONV (postoperative nausea and vomiting)   . Prediabetes   . VIN III (vulvar intraepithelial neoplasia III)   . Wears dentures    upper  . Wears glasses    Past Surgical History: Past Surgical History:  Procedure Laterality Date  . CESAREAN SECTION     X 2  . KNEE ARTHROSCOPY Left   . ORIF RIGHT ANKLE FX  1987   later hardware removed  . PLANTAR FASCIA RELEASE  12-02-2003   RIGHT HEEL  . TOTAL ABDOMINAL HYSTERECTOMY  age 34  . TOTAL HIP ARTHROPLASTY Left 7/ 2011    [facility]  . TOTAL KNEE ARTHROPLASTY Left 07-11-2010   [facility]  . VULVECTOMY Left 10/11/2013   Procedure: WIDE LOCAL EXCISON OF LEFT VULVA;  Surgeon: Everitt Amber, MD;  Location: [facility];  Service: Gynecology;  Laterality: Left;  . WRIST GANGLION EXCISION Left 2001   Social History: Social History   Socioeconomic History  . Marital status: Widowed    Spouse name: Not on file  . Number of children: 2  . Years of education: Not on file  . Highest education level: Not on file  Occupational History  . Occupation: works part time     Comment: [employer].   Social Needs  . Financial resource strain: Not on file  . Food insecurity:    Worry: Not on file    Inability: Not on file  . Transportation needs:     Medical: Not on file    Non-medical: Not on file  Tobacco Use  . Smoking status: Former Smoker    Packs/day: 0.25    Years: 15.00    Pack years: 3.75    Types: Cigarettes    Last attempt to quit: 03/15/2006    Years since quitting: 11.3  . Smokeless tobacco: Never Used  Substance and Sexual Activity  . Alcohol use: No  . Drug use: No  . Sexual activity: Not Currently  Lifestyle  . Physical activity:    Days per week: Not on file    Minutes per session: Not on file  . Stress: Not on file  Relationships  . Social connections:    Talks on phone: Not on file    Gets together: Not on file    Attends religious service: Not on file    Active member of club or organization: Not on file    Attends meetings of clubs or organizations: Not on file    Relationship status: Not on file  Other Topics Concern  . Not on file  Social History Narrative   Some walking for exercise.    Family History: Family History  Problem Relation Age of Onset  . Diabetes Father   . Cancer Sister        endometrial  cancer  . Osteoporosis Mother   . Hypothyroidism Mother    Allergies: Allergies  Allergen Reactions  . Requip [Ropinirole Hcl] Other (See Comments)    Left foot swelling.    Medications: See med rec.  Review of Systems: No headache, visual changes, nausea, vomiting, diarrhea, constipation, dizziness, abdominal pain, skin rash, fevers, chills, night sweats, weight loss, swollen lymph nodes, body aches, joint swelling, muscle aches, chest pain, shortness of breath, mood changes, visual or auditory hallucinations.   Objective:   General: Well Developed, well nourished, and in no acute distress.  Neuro:  Extra-ocular muscles intact, able to move all 4 extremities, sensation grossly intact.  Deep tendon reflexes tested were normal. Psych: Alert and oriented, mood congruent with affect. ENT:  Ears and nose appear unremarkable.  Hearing grossly normal. Neck: Unremarkable overall  appearance, trachea midline.  No visible thyroid enlargement. Eyes: Conjunctivae and lids appear unremarkable.  Pupils equal and round. Skin: Warm and dry, no rashes noted.  Cardiovascular: Pulses palpable, no extremity edema. Right hand: Swan-neck deformity with swelling at the proximal phalange of joint to the right fifth finger.  There is laxity of the radial collateral ligaments as well.  Procedure: Dislocation/subluxation reduction right fifth PIP Risks, benefits, and alternatives explained and consent obtained. Time out conducted. Surface prepped with alcohol. 2 cc lidocaine, 1 cc bupivacaine used for the digital block Adequate anesthesia ensured. I reduced the apex volar subluxation and applied a ulnar gutter splint, this provided inadequate stabilization so we then placed a static volar splint. Patient sent for postreduction films. Pt stable, aftercare and follow-up advised.  Impression and Recommendations:   This case required medical decision making of moderate complexity.  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 Digital block with gentle closed reduction, static volar splint placed Postreduction films. Return to see me in 1 week.  I would like her to also get a second opinion from hand surgery, ultimately this is going to need an arthrodesis. She has difficulty with general anesthesia, I think she could be able to have this done with a digital block.  ___________________________________________ Gwen Her. Dianah Field, M.D., ABFM., CAQSM. Primary Care and [place] Instructor of [place] [facility]

## 2017-07-08 ENCOUNTER — Ambulatory Visit: Payer: Self-pay | Admitting: Sports Medicine

## 2017-07-11 ENCOUNTER — Other Ambulatory Visit: Payer: Self-pay | Admitting: Family Medicine

## 2017-07-14 ENCOUNTER — Other Ambulatory Visit: Payer: Self-pay | Admitting: Family Medicine

## 2017-07-14 ENCOUNTER — Ambulatory Visit: Payer: Medicare Other | Admitting: Sports Medicine

## 2017-07-14 ENCOUNTER — Ambulatory Visit (INDEPENDENT_AMBULATORY_CARE_PROVIDER_SITE_OTHER): Payer: Medicare Other

## 2017-07-14 DIAGNOSIS — S63226A Subluxation of unspecified interphalangeal joint of right little finger, initial encounter: Secondary | ICD-10-CM

## 2017-07-14 DIAGNOSIS — S63226D Subluxation of unspecified interphalangeal joint of right little finger, subsequent encounter: Secondary | ICD-10-CM

## 2017-07-14 DIAGNOSIS — S62632A Displaced fracture of distal phalanx of right middle finger, initial encounter for closed fracture: Secondary | ICD-10-CM | POA: Diagnosis not present

## 2017-07-14 NOTE — Progress Notes (Signed)
Xray order placed for imaging prior to appt.

## 2017-07-14 NOTE — Progress Notes (Signed)
  Subjective: 1 week post gentle closed reduction of a 5th PIP dorsal subluxation/swan-neck deformity.  Objective: General: Well-developed, well-nourished, and in no acute distress. Right hand: Finger does stay a bit more straight, she still has a mild swan-neck deformity, and stability of the PIP is not good.  She also has some lateral subluxation.  X-rays show maintenance of reduction at the PIP.  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   Awaiting surgery appt. New splint placed.  ___________________________________________ Gwen Her. Dianah Field, M.D., ABFM., CAQSM. Primary Care and [place] [facility] [place] Instructor of [place] [facility]

## 2017-07-14 NOTE — Assessment & Plan Note (Signed)
Reduction remains but joint is unstable, needs arthrodesis.   Awaiting surgery appt. New splint placed.

## 2017-07-21 ENCOUNTER — Telehealth: Payer: Self-pay

## 2017-07-21 ENCOUNTER — Ambulatory Visit: Payer: Self-pay | Admitting: Sports Medicine

## 2017-07-21 MED ORDER — DOXYCYCLINE HYCLATE 100 MG PO TABS: 100.0000 mg | ORAL_TABLET | Freq: Two times a day (BID) | ORAL | 0 refills | Status: AC

## 2017-07-21 NOTE — Telephone Encounter (Signed)
Paige Moore called and reports she now has a wound on her little finger right hand. It is swollen and was oozing pus a couple of days ago. She does not know how she got the wound. I tried to schedule patient for an appointment but her sister is unable to bring her in. Denies fever, chills or sweats.

## 2017-07-21 NOTE — Telephone Encounter (Signed)
Patient advised of recommendations.  

## 2017-07-21 NOTE — Telephone Encounter (Signed)
Lets do a course of doxycycline,Keep it clean.

## 2017-08-01 DIAGNOSIS — M79644 Pain in right finger(s): Secondary | ICD-10-CM | POA: Diagnosis not present

## 2017-08-01 DIAGNOSIS — M79646 Pain in unspecified finger(s): Secondary | ICD-10-CM | POA: Insufficient documentation

## 2017-08-16 DIAGNOSIS — M24541 Contracture, right hand: Secondary | ICD-10-CM | POA: Diagnosis not present

## 2017-08-16 DIAGNOSIS — M21941 Unspecified acquired deformity of hand, right hand: Secondary | ICD-10-CM | POA: Diagnosis not present

## 2017-08-16 DIAGNOSIS — M19041 Primary osteoarthritis, right hand: Secondary | ICD-10-CM | POA: Diagnosis not present

## 2017-08-31 DIAGNOSIS — M79641 Pain in right hand: Secondary | ICD-10-CM | POA: Diagnosis not present

## 2017-08-31 DIAGNOSIS — M79644 Pain in right finger(s): Secondary | ICD-10-CM | POA: Diagnosis not present

## 2017-09-06 ENCOUNTER — Encounter: Payer: Self-pay | Admitting: Family Medicine

## 2017-09-06 ENCOUNTER — Ambulatory Visit (INDEPENDENT_AMBULATORY_CARE_PROVIDER_SITE_OTHER): Payer: Medicare Other | Admitting: Family Medicine

## 2017-09-06 VITALS — BP 122/87 | HR 101 | Ht 64.0 in | Wt 209.0 lb

## 2017-09-06 DIAGNOSIS — G20 Parkinson's disease: Secondary | ICD-10-CM | POA: Diagnosis not present

## 2017-09-06 DIAGNOSIS — E119 Type 2 diabetes mellitus without complications: Secondary | ICD-10-CM | POA: Diagnosis not present

## 2017-09-06 DIAGNOSIS — M20001 Unspecified deformity of right finger(s): Secondary | ICD-10-CM | POA: Diagnosis not present

## 2017-09-06 DIAGNOSIS — R251 Tremor, unspecified: Secondary | ICD-10-CM

## 2017-09-06 LAB — BASIC METABOLIC PANEL WITH GFR
BUN: 17 mg/dL (ref 7–25)
CO2: 29 mmol/L (ref 20–32)
Calcium: 9.9 mg/dL (ref 8.6–10.4)
Chloride: 104 mmol/L (ref 98–110)
Creat: 0.85 mg/dL (ref 0.60–0.93)
GFR, Est African American: 79 mL/min/{1.73_m2} (ref >=60)
GFR, Est Non African American: 68 mL/min/{1.73_m2} (ref >=60)
Glucose, Bld: 110 mg/dL — ABNORMAL HIGH (ref 65–99)
Potassium: 4 mmol/L (ref 3.5–5.3)
Sodium: 141 mmol/L (ref 135–146)

## 2017-09-06 LAB — POCT GLYCOSYLATED HEMOGLOBIN (HGB A1C): Hemoglobin A1C: 6.2 % — AB (ref 4.0–5.6)

## 2017-09-06 MED ORDER — METFORMIN HCL 500 MG PO TABS: 500.0000 mg | ORAL_TABLET | Freq: Two times a day (BID) | ORAL | 1 refills | Status: DC

## 2017-09-06 MED ORDER — CARBIDOPA-LEVODOPA 10-100 MG PO TABS: 1.0000 | ORAL_TABLET | Freq: Three times a day (TID) | ORAL | 1 refills | Status: DC

## 2017-09-06 NOTE — Progress Notes (Signed)
All labs are normal. 

## 2017-09-06 NOTE — Progress Notes (Signed)
Subjective:    CC: Tremor   HPI:  Diabetes - no hypoglycemic events. No wounds or sores that are not healing well. No increased thirst or urination. Checking glucose at home. Taking medications as prescribed without any side effects.  Follow-up tremor- she is doing well.  She says the medication does help control tremor.  Tremor still predominantly just affects her right hand and arm.  She is able to take her medication 3 times a day most days and does well.  She says in fact she actually sleeps better since taking it.  She has noticed occ shuffle of her gait.    She did end up having surgery on her fifth digit on her right hand for deformity.  She had to wear splint for several weeks and is actually doing really well.  She has a follow-up in the next couple of weeks.  Past medical history, Surgical history, Family history not pertinant except as noted below, Social history, Allergies, and medications have been entered into the medical record, reviewed, and corrections made.   Review of Systems: No fevers, chills, night sweats, weight loss, chest pain, or shortness of breath.   Objective:    General: Well Developed, well nourished, and in no acute distress.  Neuro: Alert and oriented x3, extra-ocular muscles intact, sensation grossly intact.  HEENT: Normocephalic, atraumatic  Skin: Warm and dry, no rashes. Cardiac: Regular rate and rhythm, no murmurs rubs or gallops, no lower extremity edema.  Respiratory: Clear to auscultation bilaterally. Not using accessory muscles, speaking in full sentences.   Impression and Recommendations:    DM-controlled.  Continue current regimen.  Due for BMP.  Reminded to schedule eye exam. Lab Results  Component Value Date   HGBA1C 6.2 (A) 09/06/2017   Tremor/Parkinson's disease.  -He did request a 90-day supply on her carbidopa levodopa.  New prescription sent to pharmacy.  We can certainly adjust her dose or tweak it at any point if needed.  Right now  she is being followed by me and has not seen a neurologist but if her symptoms progress we will certainly get her referred.  She does have some cogwheeling in that right arm and has noticed some occasional shuffling gait so again we will keep an eye on this.  Status post  finger surgery for finger deformity on the fifth digit on the right hand.  She is actually doing really well.  She was not sure if she was allowed to drive but I encouraged her to speak with the surgeon about that.  Follow-up is next week.

## 2017-09-14 DIAGNOSIS — M24541 Contracture, right hand: Secondary | ICD-10-CM | POA: Diagnosis not present

## 2017-09-14 DIAGNOSIS — M25641 Stiffness of right hand, not elsewhere classified: Secondary | ICD-10-CM | POA: Diagnosis not present

## 2017-09-29 DIAGNOSIS — M25641 Stiffness of right hand, not elsewhere classified: Secondary | ICD-10-CM | POA: Diagnosis not present

## 2017-10-13 DIAGNOSIS — M24541 Contracture, right hand: Secondary | ICD-10-CM | POA: Diagnosis not present

## 2017-11-11 DIAGNOSIS — M79672 Pain in left foot: Secondary | ICD-10-CM | POA: Diagnosis not present

## 2017-12-03 ENCOUNTER — Other Ambulatory Visit: Payer: Self-pay | Admitting: Family Medicine

## 2017-12-03 DIAGNOSIS — E119 Type 2 diabetes mellitus without complications: Secondary | ICD-10-CM

## 2017-12-14 DIAGNOSIS — M79672 Pain in left foot: Secondary | ICD-10-CM | POA: Diagnosis not present

## 2017-12-22 DIAGNOSIS — R6889 Other general symptoms and signs: Secondary | ICD-10-CM | POA: Diagnosis not present

## 2017-12-22 DIAGNOSIS — M76822 Posterior tibial tendinitis, left leg: Secondary | ICD-10-CM | POA: Diagnosis not present

## 2017-12-22 DIAGNOSIS — M79672 Pain in left foot: Secondary | ICD-10-CM | POA: Diagnosis not present

## 2018-01-10 ENCOUNTER — Encounter: Payer: Self-pay | Admitting: Family Medicine

## 2018-01-10 ENCOUNTER — Ambulatory Visit (INDEPENDENT_AMBULATORY_CARE_PROVIDER_SITE_OTHER): Payer: Medicare Other | Admitting: Family Medicine

## 2018-01-10 VITALS — BP 134/84 | HR 93 | Ht 64.0 in | Wt 207.0 lb

## 2018-01-10 DIAGNOSIS — E785 Hyperlipidemia, unspecified: Secondary | ICD-10-CM | POA: Diagnosis not present

## 2018-01-10 DIAGNOSIS — G20A1 Parkinson's disease without dyskinesia, without mention of fluctuations: Secondary | ICD-10-CM

## 2018-01-10 DIAGNOSIS — E119 Type 2 diabetes mellitus without complications: Secondary | ICD-10-CM

## 2018-01-10 DIAGNOSIS — G20 Parkinson's disease: Secondary | ICD-10-CM | POA: Diagnosis not present

## 2018-01-10 LAB — COMPLETE METABOLIC PANEL WITH GFR
AG Ratio: 1.6 (calc) (ref 1.0–2.5)
ALT: 13 U/L (ref 6–29)
AST: 17 U/L (ref 10–35)
Albumin: 4.6 g/dL (ref 3.6–5.1)
Alkaline phosphatase (APISO): 86 U/L (ref 33–130)
BUN/Creatinine Ratio: 31 (calc) — ABNORMAL HIGH (ref 6–22)
BUN: 28 mg/dL — ABNORMAL HIGH (ref 7–25)
CO2: 30 mmol/L (ref 20–32)
Calcium: 10 mg/dL (ref 8.6–10.4)
Chloride: 105 mmol/L (ref 98–110)
Creat: 0.89 mg/dL (ref 0.60–0.93)
GFR, Est African American: 75 mL/min/{1.73_m2} (ref >=60)
GFR, Est Non African American: 65 mL/min/{1.73_m2} (ref >=60)
Globulin: 2.9 g/dL (calc) (ref 1.9–3.7)
Glucose, Bld: 113 mg/dL — ABNORMAL HIGH (ref 65–99)
Potassium: 4.5 mmol/L (ref 3.5–5.3)
Sodium: 143 mmol/L (ref 135–146)
Total Bilirubin: 0.5 mg/dL (ref 0.2–1.2)
Total Protein: 7.5 g/dL (ref 6.1–8.1)

## 2018-01-10 LAB — LIPID PANEL
Cholesterol: 128 mg/dL (ref <200)
HDL: 56 mg/dL (ref >50)
LDL Cholesterol (Calc): 53 mg/dL (calc)
Non-HDL Cholesterol (Calc): 72 mg/dL (calc) (ref <130)
Total CHOL/HDL Ratio: 2.3 (calc) (ref <5.0)
Triglycerides: 107 mg/dL (ref <150)

## 2018-01-10 LAB — POCT UA - MICROALBUMIN
Albumin/Creatinine Ratio, Urine, POC: <30
Creatinine, POC: 50 mg/dL
Microalbumin Ur, POC: 10 mg/L

## 2018-01-10 LAB — TSH: TSH: 1.84 mIU/L (ref 0.40–4.50)

## 2018-01-10 LAB — POCT GLYCOSYLATED HEMOGLOBIN (HGB A1C): Hemoglobin A1C: 6 % — AB (ref 4.0–5.6)

## 2018-01-10 NOTE — Progress Notes (Signed)
Subjective:    CC: DM  HPI:  Diabetes - no hypoglycemic events. No wounds or sores that are not healing well. No increased thirst or urination. Checking glucose at home. Taking medications as prescribed without any side effects.  Parkinson's tremor - she is doing well but forgets to take the dose in the middle of the day.  Wonders if we could try increasing the strength of the dose and see if that would carry her a little longer in case she forgets to take her dose in the middle the day.  Hpyerlipidemia-currently on atorvastatin doing well without any side effects or myalgias.  Past medical history, Surgical history, Family history not pertinant except as noted below, Social history, Allergies, and medications have been entered into the medical record, reviewed, and corrections made.   Review of Systems: No fevers, chills, night sweats, weight loss, chest pain, or shortness of breath.   Objective:    General: Well Developed, well nourished, and in no acute distress.  Neuro: Alert and oriented x3, extra-ocular muscles intact, sensation grossly intact.  HEENT: Normocephalic, atraumatic  Skin: Warm and dry, no rashes. Cardiac: Regular rate and rhythm, no murmurs rubs or gallops, no lower extremity edema.  Respiratory: Clear to auscultation bilaterally. Not using accessory muscles, speaking in full sentences. MSK: tremor in the right hand   Impression and Recommendations:    DM - Well controlled.  Dr. A1c is improved.  Foot exam performed today.  Did encourage her to get her eye exam scheduled.  Continue current regimen. Follow up in  4 months.  For CMP and lipid panel. On ACE.    Parkinson's tremor -Changed Sinemet to extended release.  Hopefully this will be cost prohibitive but if it is and she can give [place] call back and we can switch back and may be try increasing the dose.  Hyperlipidemia-continue current regimen due for lipid panel and liver enzyme check.

## 2018-03-01 ENCOUNTER — Other Ambulatory Visit: Payer: Self-pay | Admitting: Family Medicine

## 2018-03-01 ENCOUNTER — Other Ambulatory Visit: Payer: Self-pay | Admitting: *Deleted

## 2018-04-07 ENCOUNTER — Other Ambulatory Visit: Payer: Self-pay | Admitting: Family Medicine

## 2018-04-07 DIAGNOSIS — E119 Type 2 diabetes mellitus without complications: Secondary | ICD-10-CM

## 2018-04-19 DIAGNOSIS — H25813 Combined forms of age-related cataract, bilateral: Secondary | ICD-10-CM | POA: Diagnosis not present

## 2018-04-19 DIAGNOSIS — H5203 Hypermetropia, bilateral: Secondary | ICD-10-CM | POA: Diagnosis not present

## 2018-04-19 DIAGNOSIS — E119 Type 2 diabetes mellitus without complications: Secondary | ICD-10-CM | POA: Diagnosis not present

## 2018-04-19 LAB — HM DIABETES EYE EXAM

## 2018-05-03 NOTE — Progress Notes (Deleted)
Subjective:   Paige Moore is a 73 y.o. female who presents for Medicare Annual (Subsequent) preventive examination.  Review of Systems:  No ROS.  Medicare Wellness Visit. Additional risk factors are reflected in the social history.    Sleep patterns:    Home Safety/Smoke Alarms: Feels safe in home. Smoke alarms in place.  Living environment; Seat Belt Safety/Bike Helmet: Wears seat belt.   Female:   Pap-  Aged out     Mammo- utd    Dexa scan-        CCS- utd      Objective:     Vitals: There were no vitals taken for this visit.  There is no height or weight on file to calculate BMI.  Advanced Directives 05/04/2016 10/11/2013  Does Patient Have a Medical Advance Directive? Yes Patient has advance directive, copy not in chart  Type of Advance Directive [place];Living will [place]  Does patient want to make changes to medical advance directive? No - Patient declined -  Copy of [place] in Chart? No - copy requested -    Tobacco Social History   Tobacco Use  Smoking Status Former Smoker  . Packs/day: 0.25  . Years: 15.00  . Pack years: 3.75  . Types: Cigarettes  . Last attempt to quit: 03/15/2006  . Years since quitting: 12.1  Smokeless Tobacco Never Used     Counseling given: Not Answered   Clinical Intake:                       Past Medical History:  Diagnosis Date  . Essential tremor   . Hyperlipidemia   . OA (osteoarthritis)   . PONV (postoperative nausea and vomiting)   . Prediabetes   . VIN III (vulvar intraepithelial neoplasia III)   . Wears dentures    upper  . Wears glasses    Past Surgical History:  Procedure Laterality Date  . CESAREAN SECTION     X 2  . KNEE ARTHROSCOPY Left   . ORIF RIGHT ANKLE FX  1987   later hardware removed  . PLANTAR FASCIA RELEASE  12-02-2003   RIGHT HEEL  . TOTAL ABDOMINAL HYSTERECTOMY  age 89  . TOTAL HIP ARTHROPLASTY Left 7/ 2011     [facility]  . TOTAL KNEE ARTHROPLASTY Left 07-11-2010   [facility]  . VULVECTOMY Left 10/11/2013   Procedure: WIDE LOCAL EXCISON OF LEFT VULVA;  Surgeon: Everitt Amber, MD;  Location: [facility];  Service: Gynecology;  Laterality: Left;  . WRIST GANGLION EXCISION Left 2001   Family History  Problem Relation Age of Onset  . Diabetes Father   . Cancer Sister        endometrial cancer  . Osteoporosis Mother   . Hypothyroidism Mother    Social History   Socioeconomic History  . Marital status: Widowed    Spouse name: Not on file  . Number of children: 2  . Years of education: Not on file  . Highest education level: Not on file  Occupational History  . Occupation: works part time     Comment: [employer].   Social Needs  . Financial resource strain: Not on file  . Food insecurity:    Worry: Not on file    Inability: Not on file  . Transportation needs:    Medical: Not on file    Non-medical: Not on file  Tobacco  Use  . Smoking status: Former Smoker    Packs/day: 0.25    Years: 15.00    Pack years: 3.75    Types: Cigarettes    Last attempt to quit: 03/15/2006    Years since quitting: 12.1  . Smokeless tobacco: Never Used  Substance and Sexual Activity  . Alcohol use: No  . Drug use: No  . Sexual activity: Not Currently  Lifestyle  . Physical activity:    Days per week: Not on file    Minutes per session: Not on file  . Stress: Not on file  Relationships  . Social connections:    Talks on phone: Not on file    Gets together: Not on file    Attends religious service: Not on file    Active member of club or organization: Not on file    Attends meetings of clubs or organizations: Not on file    Relationship status: Not on file  Other Topics Concern  . Not on file  Social History Narrative   Some walking for exercise.     Outpatient Encounter Medications as of 05/09/2018  Medication Sig  . acetaminophen (TYLENOL) 500 MG tablet Take by mouth. 1-2 tabs po  tid prn   . AMBULATORY NON FORMULARY MEDICATION Medication Name: tdap vaccine. Single administration.  Marland Kitchen atorvastatin (LIPITOR) 40 MG tablet TAKE 1 TABLET BY MOUTH ONCE DAILY  . Blood Glucose Monitoring Suppl (ONE TOUCH ULTRA SYSTEM KIT) W/DEVICE KIT 1 kit by Does not apply route once.  . carbidopa-levodopa (SINEMET IR) 10-100 MG tablet Take 1 tablet by mouth 3 (three) times daily.  Marland Kitchen docusate sodium (COLACE) 100 MG capsule Take 1 capsule (100 mg total) by mouth 2 (two) times daily.  Marland Kitchen glucose blood (ONE TOUCH ULTRA TEST) test strip USE 1 STRIP TO CHECK GLUCOSE ONCE DAILY  . ibuprofen (ADVIL,MOTRIN) 200 MG tablet Take 200 mg by mouth every 6 (six) hours as needed.  . metFORMIN (GLUCOPHAGE) 500 MG tablet TAKE 1 TABLET BY MOUTH TWICE DAILY WITH A MEAL.  Marland Kitchen ONETOUCH DELICA LANCETS 97W MISC USE 1  TO CHECK GLUCOSE ONCE DAILY  . traMADol (ULTRAM) 50 MG tablet Take 1 tablet (50 mg total) by mouth every 8 (eight) hours as needed for moderate pain. Maximum 6 tabs per day.   No facility-administered encounter medications on file as of 05/09/2018.     Activities of Daily Living No flowsheet data found.  Patient Care Team: Hali Marry, MD as PCP - General (Family Medicine)    Assessment:   This is a routine wellness examination for Paige Moore.Physical assessment deferred to PCP.   Exercise Activities and Dietary recommendations   Diet  Breakfast: Lunch:  Dinner:       Goals    . Exercise 3x per week (30 min per time) (pt-stated)     She would like to walk for at least 20 minutes for 3-4 times per week.       Fall Risk Fall Risk  05/17/2017 05/04/2016 10/01/2015 05/14/2014 04/13/2013  Falls in the past year? No No No No No   Is the patient's home free of loose throw rugs in walkways, pet beds, electrical cords, etc?   {Blank single:19197::"yes","no"}      Grab bars in the bathroom? {Blank single:19197::"yes","no"}      Handrails on the stairs?   {Blank single:19197::"yes","no"}       Adequate lighting?   {Blank single:19197::"yes","no"}   Depression Screen [facility] 2/9 Scores 05/17/2017 04/19/2017 05/04/2016 10/01/2015  PHQ - 2 Score 0 0 0 0     Cognitive Function     6CIT Screen 05/04/2016  What Year? 0 points  What month? 0 points  What time? 0 points  Count back from 20 0 points  Repeat phrase 0 points    Immunization History  Administered Date(s) Administered  . Influenza Split 12/16/2010, 12/27/2011  . Influenza Whole 12/27/2005, 01/31/2007, 12/22/2009  . Influenza, High Dose Seasonal PF 12/19/2015, 12/09/2016  . Influenza,inj,Quad PF,6+ Mos 01/08/2013, 12/13/2013, 12/24/2014  . Pneumococcal Conjugate-13 09/23/2014  . Pneumococcal Polysaccharide-23 06/23/2011  . Zoster 04/15/2012     Screening Tests Health Maintenance  Topic Date Due  . TETANUS/TDAP  03/15/2017  . OPHTHALMOLOGY EXAM  07/20/2017  . INFLUENZA VACCINE  10/13/2017  . HEMOGLOBIN A1C  07/12/2018  . MAMMOGRAM  07/31/2018  . FOOT EXAM  01/11/2019  . URINE MICROALBUMIN  01/11/2019  . COLONOSCOPY  02/16/2021  . DEXA SCAN  Completed  . Hepatitis C Screening  Completed  . PNA vac Low Risk Adult  Completed       Plan:   ***   I have personally reviewed and noted the following in the patient's chart:   . Medical and social history . Use of alcohol, tobacco or illicit drugs  . Current medications and supplements . Functional ability and status . Nutritional status . Physical activity . Advanced directives . List of other physicians . Hospitalizations, surgeries, and ER visits in previous 12 months . Vitals . Screenings to include cognitive, depression, and falls . Referrals and appointments  In addition, I have reviewed and discussed with patient certain preventive protocols, quality metrics, and best practice recommendations. A written personalized care plan for preventive services as well as general preventive health recommendations were provided to patient.     Joanne Chars, LPN  [id number]

## 2018-05-09 ENCOUNTER — Ambulatory Visit (INDEPENDENT_AMBULATORY_CARE_PROVIDER_SITE_OTHER): Payer: Medicare Other | Admitting: Family Medicine

## 2018-05-09 ENCOUNTER — Encounter: Payer: Self-pay | Admitting: Family Medicine

## 2018-05-09 ENCOUNTER — Ambulatory Visit: Payer: Self-pay

## 2018-05-09 VITALS — BP 136/82 | HR 98 | Ht 64.0 in | Wt 204.0 lb

## 2018-05-09 DIAGNOSIS — G20 Parkinson's disease: Secondary | ICD-10-CM

## 2018-05-09 DIAGNOSIS — E119 Type 2 diabetes mellitus without complications: Secondary | ICD-10-CM

## 2018-05-09 DIAGNOSIS — H259 Unspecified age-related cataract: Secondary | ICD-10-CM

## 2018-05-09 LAB — POCT GLYCOSYLATED HEMOGLOBIN (HGB A1C): Hemoglobin A1C: 5.9 % — AB (ref 4.0–5.6)

## 2018-05-09 NOTE — Progress Notes (Signed)
Subjective:    CC: DM  HPI:  Diabetes - no hypoglycemic events. No wounds or sores that are not healing well. No increased thirst or urination. Checking glucose at home. Taking medications as prescribed without any side effects.  Says she recently went for eye exam and was told instead of getting new lenses that she really needs to go ahead and have cataract surgery.  She says she is getting to the point where everything just looks blurry.  F/U Parkinson's Disease - she is doing well.  She is dong well on her Sinemet.  She feels like it is helpful.    She is planning on having cataract surgery in April on both eyes.  Past medical history, Surgical history, Family history not pertinant except as noted below, Social history, Allergies, and medications have been entered into the medical record, reviewed, and corrections made.   Review of Systems: No fevers, chills, night sweats, weight loss, chest pain, or shortness of breath.   Objective:    General: Well Developed, well nourished, and in no acute distress.  Neuro: Alert and oriented x3, extra-ocular muscles intact, sensation grossly intact.  HEENT: Normocephalic, atraumatic  Skin: Warm and dry, no rashes. Cardiac: Regular rate and rhythm, no murmurs rubs or gallops, no lower extremity edema.  Respiratory: Clear to auscultation bilaterally. Not using accessory muscles, speaking in full sentences.   Impression and Recommendations:    DM -globin A1c looks fantastic today at 5.9.  Continue current regimen.  Follow-up in 4 months.  Call for eye exam report.  Is on a statin.  Will call for most recent eye exam.  Parkinson's Disease - she is doing well overall. No chagnes.    Cataracts - planning surgery in April.

## 2018-05-10 LAB — BASIC METABOLIC PANEL WITH GFR
BUN: 19 mg/dL (ref 7–25)
CO2: 23 mmol/L (ref 20–32)
Calcium: 9.7 mg/dL (ref 8.6–10.4)
Chloride: 106 mmol/L (ref 98–110)
Creat: 0.77 mg/dL (ref 0.60–0.93)
GFR, Est African American: 89 mL/min/{1.73_m2} (ref >=60)
GFR, Est Non African American: 77 mL/min/{1.73_m2} (ref >=60)
Glucose, Bld: 89 mg/dL (ref 65–99)
Potassium: 4.1 mmol/L (ref 3.5–5.3)
Sodium: 142 mmol/L (ref 135–146)

## 2018-05-10 NOTE — Progress Notes (Signed)
All labs are normal. 

## 2018-05-16 ENCOUNTER — Ambulatory Visit: Payer: Self-pay

## 2018-05-18 NOTE — Progress Notes (Signed)
Subjective:   Paige Moore is a 73 y.o. female who presents for Medicare Annual (Subsequent) preventive examination.  Review of Systems:  No ROS.  Medicare Wellness Visit. Additional risk factors are reflected in the social history.  Cardiac Risk Factors include: advanced age (>59mn, >>3women);diabetes mellitus Sleep ast least 8 hours a night. Wakes up 1 time to go void. Home Safety/Smoke Alarms: Feels safe in home. Smoke alarms in place.  Living environment; [place] alone in an apartment. Staurs in place and does not trip over them. Show Seat Belt Safety/Bike Helmet:  Wakes up duitin the mifflr of the night   Female:   Pap-  Aged out      Mammo- utd      Dexa scan-  Declined at this time.      CCS- utd     Objective:     Vitals: BP 126/68 (BP Location: Left Arm, Patient Position: Sitting, Cuff Size: Large)   Pulse 83   Ht 5' 4" (1.626 m)   Wt 206 lb (93.4 kg)   SpO2 91%   BMI 35.36 kg/m   Body mass index is 35.36 kg/m.  Advanced Directives 05/04/2016 10/11/2013  Does Patient Have a Medical Advance Directive? Yes Patient has advance directive, copy not in chart  [place] Does patient want to make changes to medical advance directive? No - Patient declined -  [place]? No - copy requested -    Tobacco Social History   Tobacco Use  Smoking Status Former Smoker  . Packs/day: 0.25  . Years: 15.00  . Pack years: 3.75  . Types: Cigarettes  . Last attempt to quit: 03/15/2006  . Years since quitting: 12.1  Smokeless Tobacco Never Used     Counseling given: No   Clinical Intake:  Pre-visit preparation completed: Yes        Nutritional Risks: None Diabetes: Yes CBG done?: No(checked BS last night at home was 122) Did pt. bring in CBG monitor from home?: No  How often do you need to have someone help you when you read instructions,  pamphlets, or other written materials from your doctor or pharmacy?: 1 - Never What is the last grade level you completed in school?: 8  Interpreter Needed?: No  Comments: KG LPN Information entered by :: KOrlie Dakin Lpn  Past Medical History:  Diagnosis Date  . Essential tremor   . Hyperlipidemia   . OA (osteoarthritis)   . PONV (postoperative nausea and vomiting)   . Prediabetes   . VIN III (vulvar intraepithelial neoplasia III)   . Wears dentures    upper  . Wears glasses    Past Surgical History:  Procedure Laterality Date  . CESAREAN SECTION     X 2  . KNEE ARTHROSCOPY Left   . ORIF RIGHT ANKLE FX  1987   later hardware removed  . PLANTAR FASCIA RELEASE  12-02-2003   RIGHT HEEL  . TOTAL ABDOMINAL HYSTERECTOMY  age 73 . TOTAL HIP ARTHROPLASTY Left 7/ 2011    [facility]  . TOTAL KNEE ARTHROPLASTY Left 07-11-2010   [facility]  . VULVECTOMY Left 10/11/2013   Procedure: WIDE LOCAL EXCISON OF LEFT VULVA;  Surgeon: EEveritt Amber MD;  Location[place] [facility]  Service: Gynecology;  Laterality: Left;  . WRIST GANGLION EXCISION Left 2001   Family History  Problem Relation Age of Onset  .  Diabetes Father   . Cancer Sister        endometrial cancer  . Osteoporosis Mother   . Hypothyroidism Mother    Social History   Socioeconomic History  . Marital status: Widowed    Spouse name: Not on file  . Number of children: 2  . Years of education: 8  . Highest education level: 8th grade  Occupational History  . 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 Financial resource strain: Not hard at all  . Food insecurity:    Worry: Not on file    Inability: Never true  . Transportation needs:    Medical: No    Non-medical: No  Tobacco Use  . Smoking status: Former Smoker    Packs/day: 0.25    Years: 15.00    Pack years: 3.75    Types: Cigarettes    Last attempt to quit: 03/15/2006    Years since quitting: 12.1  . Smokeless tobacco: Never Used   Substance and Sexual Activity  . Alcohol use: No  . Drug use: No  . Sexual activity: Not Currently  Lifestyle  . Physical activity:    Days per week: 7 days    Minutes per session: 60 min  . Stress: Not at all  Relationships  . Social connections:    Talks on phone: Not on file    Gets together: Once a week    Attends religious service: Never    Active member of club or organization: Not on file    Attends meetings of clubs or organizations: Never    Relationship status: Widowed  Other Topics Concern  . 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 Likes to walk in her neighborhood. Drinks a lot of water daiily    Outpatient Encounter Medications as of 05/22/2018  Medication Sig  . acetaminophen (TYLENOL) 500 MG tablet Take by mouth. 1-2 tabs po tid prn   . AMBULATORY NON FORMULARY MEDICATION Medication Name: tdap vaccine. Single administration.  Marland Kitchen atorvastatin (LIPITOR) 40 MG tablet TAKE 1 TABLET BY MOUTH ONCE DAILY  . Blood Glucose Monitoring Suppl (ONE TOUCH ULTRA SYSTEM KIT) W/DEVICE KIT 1 kit by Does not apply route once.  . carbidopa-levodopa (SINEMET IR) 10-100 MG tablet Take 1 tablet by mouth 3 (three) times daily.  Marland Kitchen docusate sodium (COLACE) 100 MG capsule Take 1 capsule (100 mg total) by mouth 2 (two) times daily.  Marland Kitchen glucose blood (ONE TOUCH ULTRA TEST) test strip USE 1 STRIP TO CHECK GLUCOSE ONCE DAILY  . ibuprofen (ADVIL,MOTRIN) 200 MG tablet Take 200 mg by mouth every 6 (six) hours as needed.  . metFORMIN (GLUCOPHAGE) 500 MG tablet TAKE 1 TABLET BY MOUTH TWICE DAILY WITH A MEAL.  Marland Kitchen ONETOUCH DELICA LANCETS 22W MISC USE 1  TO CHECK GLUCOSE ONCE DAILY   No facility-administered encounter medications on file as of 05/22/2018.     Activities of Daily Living In your present state of health, do you have any difficulty performing the following activities: 05/22/2018 05/22/2018  Hearing? N N  Vision? N N  Difficulty concentrating  or making decisions? N N  Walking or climbing stairs? N N  Dressing or bathing? N N  Doing errands, shopping? N -  Preparing Food and eating ? N N  Using the Toilet? N N  In the past six months, have you accidently leaked urine? N N  Do you have problems  with loss of bowel control? N N  Managing your Medications? N N  Managing your Finances? N N  Housekeeping or managing your Housekeeping? N N  Some recent data might be hidden    Patient Care Team: Hali Marry, MD as PCP - General (Family Medicine)    Assessment:   This is a routine wellness examination for [place]..Physical assessment deferred to PCP.Physical assessment deferred to PCP. Marland Kitchen   Exercise Activities and Dietary recommendations Current Exercise Habits: Home exercise routine, Type of exercise: stretching, Time (Minutes): 40, Frequency (Times/Week): 7, Weekly Exercise (Minutes/Week): 280, Intensity: Mild, Exercise limited by: None identified Diet Eats in the home most of the time,Eats healthy with fruits and vegetables. Eats cottage cheese Breakfast: oatmeal or egg sandwich Lunch: soup and grilled cheese Dinner:   Vegetable and meat. Does snack. Drinks water daily     Goals    . Exercise 3x per week (30 min per time) (pt-stated)     She would like to walk for at least 20 minutes for 3-4 times per week.       Fall Risk Fall Risk  05/22/2018 05/17/2017 05/04/2016 10/01/2015 05/14/2014  Falls in the past year? 0 No No No No  Number falls in past yr: 0 - - - -  Follow up Falls prevention discussed - - - -   Is the patient's home free of loose throw rugs in walkways, pet beds, electrical cords, etc?   yes      Grab bars in the bathroom? yes      Handrails on the stairs?   yes      Adequate lighting?   yes    Depression Screen PHQ 2/9 Scores 05/22/2018 05/17/2017 04/19/2017 05/04/2016  PHQ - 2 Score 0 0 0 0     Cognitive Function     6CIT Screen 05/22/2018 05/04/2016  What Year? - 0 points  What month? - 0 points   What time? 0 points 0 points  Count back from 20 0 points 0 points  Months in reverse 2 points -  Repeat phrase 0 points 0 points    Immunization History  Administered Date(s) Administered  . Influenza Split 12/16/2010, 12/27/2011  . Influenza Whole 12/27/2005, 01/31/2007, 12/22/2009  . Influenza, High Dose Seasonal PF 12/19/2015, 12/09/2016, 01/10/2018  . Influenza,inj,Quad PF,6+ Mos 01/08/2013, 12/13/2013, 12/24/2014  . Pneumococcal Conjugate-13 09/23/2014  . Pneumococcal Polysaccharide-23 06/23/2011  . Zoster 04/15/2012    Screening Tests Health Maintenance  Topic Date Due  . OPHTHALMOLOGY EXAM  07/20/2017  . TETANUS/TDAP  05/10/2019 (Originally 03/15/2017)  . MAMMOGRAM  07/31/2018  . HEMOGLOBIN A1C  11/07/2018  . FOOT EXAM  01/11/2019  . URINE MICROALBUMIN  01/11/2019  . COLONOSCOPY  02/16/2021  . INFLUENZA VACCINE  Completed  . DEXA SCAN  Completed  . Hepatitis C Screening  Completed  . PNA vac Low Risk Adult  Completed       Plan:    Ms. Akre , Thank you for taking time to come for your Medicare Wellness Visit. I appreciate your ongoing commitment to your health goals. Please review the following plan we discussed and let me know if I can assist you in the future.   Please schedule your next medicare wellness visit with me in 1 yr. Continue doing brain stimulating activities (puzzles, reading, adult coloring books, staying active) to keep memory sharp.     These are the goals we discussed: Goals    . Exercise 3x per week (30 min  per time) (pt-stated)     She would like to walk for at least 20 minutes for 3-4 times per week.       This is a list of the screening recommended for you and due dates:  Health Maintenance  Topic Date Due  . Eye exam for diabetics  07/20/2017  . Tetanus Vaccine  05/10/2019*  . Mammogram  07/31/2018  . Hemoglobin A1C  11/07/2018  . Complete foot exam   01/11/2019  . Urine Protein Check  01/11/2019  . Colon Cancer Screening   02/16/2021  . Flu Shot  Completed  . DEXA scan (bone density measurement)  Completed  .  Hepatitis C: One time screening is recommended by Center for Disease Control  (CDC) for  adults born from [birth year] through 1965.   Completed  . Pneumonia vaccines  Completed  *Topic was postponed. The date shown is not the original due date.     I have personally reviewed and noted the following in the patient's chart:   . Medical and social history . Use of alcohol, tobacco or illicit drugs  . Current medications and supplements . Functional ability and status . Nutritional status . Physical activity . Advanced directives . List of other physicians . Hospitalizations, surgeries, and ER visits in previous 12 months . Vitals . Screenings to include cognitive, depression, and falls . Referrals and appointments  In addition, I have reviewed and discussed with patient certain preventive protocols, quality metrics, and best practice recommendations. A written personalized care plan for preventive services as well as general preventive health recommendations were provided to patient.     Joanne Chars, LPN  08/17/4648

## 2018-05-22 ENCOUNTER — Ambulatory Visit (INDEPENDENT_AMBULATORY_CARE_PROVIDER_SITE_OTHER): Payer: Medicare Other | Admitting: *Deleted

## 2018-05-22 VITALS — BP 126/68 | HR 83 | Ht 64.0 in | Wt 206.0 lb

## 2018-05-22 DIAGNOSIS — Z0000 Encounter for general adult medical examination without abnormal findings: Secondary | ICD-10-CM

## 2018-05-22 NOTE — Patient Instructions (Signed)
Ms. Apt , Thank you for taking time to come for your Medicare Wellness Visit. I appreciate your ongoing commitment to your health goals. Please review the following plan we discussed and let me know if I can assist you in the future.   Please schedule your next medicare wellness visit with me in 1 yr. Continue doing brain stimulating activities (puzzles, reading, adult coloring books, staying active) to keep memory sharp.  These are the goals we discussed: Goals    . Exercise 3x per week (30 min per time) (pt-stated)     She would like to walk for at least 20 minutes for 3-4 times per week.

## 2018-05-26 ENCOUNTER — Other Ambulatory Visit: Payer: Self-pay | Admitting: Family Medicine

## 2018-05-26 DIAGNOSIS — E119 Type 2 diabetes mellitus without complications: Secondary | ICD-10-CM

## 2018-06-22 ENCOUNTER — Encounter: Payer: Self-pay | Admitting: Family Medicine

## 2018-07-19 DIAGNOSIS — M79672 Pain in left foot: Secondary | ICD-10-CM | POA: Diagnosis not present

## 2018-07-20 DIAGNOSIS — M79672 Pain in left foot: Secondary | ICD-10-CM | POA: Diagnosis not present

## 2018-09-07 ENCOUNTER — Encounter: Payer: Self-pay | Admitting: Family Medicine

## 2018-09-07 ENCOUNTER — Ambulatory Visit (INDEPENDENT_AMBULATORY_CARE_PROVIDER_SITE_OTHER): Payer: Medicare Other | Admitting: Family Medicine

## 2018-09-07 VITALS — BP 132/82 | HR 97 | Ht 64.0 in | Wt 199.0 lb

## 2018-09-07 DIAGNOSIS — E1169 Type 2 diabetes mellitus with other specified complication: Secondary | ICD-10-CM | POA: Diagnosis not present

## 2018-09-07 DIAGNOSIS — R2681 Unsteadiness on feet: Secondary | ICD-10-CM | POA: Insufficient documentation

## 2018-09-07 DIAGNOSIS — G20 Parkinson's disease: Secondary | ICD-10-CM

## 2018-09-07 DIAGNOSIS — E785 Hyperlipidemia, unspecified: Secondary | ICD-10-CM | POA: Diagnosis not present

## 2018-09-07 DIAGNOSIS — E118 Type 2 diabetes mellitus with unspecified complications: Secondary | ICD-10-CM | POA: Diagnosis not present

## 2018-09-07 DIAGNOSIS — E1129 Type 2 diabetes mellitus with other diabetic kidney complication: Secondary | ICD-10-CM | POA: Insufficient documentation

## 2018-09-07 LAB — POCT GLYCOSYLATED HEMOGLOBIN (HGB A1C): Hemoglobin A1C: 6 % — AB (ref 4.0–5.6)

## 2018-09-07 MED ORDER — AMBULATORY NON FORMULARY MEDICATION: 0 refills | Status: DC

## 2018-09-07 NOTE — Assessment & Plan Note (Signed)
Feels current regimen is working adequately.  She does notice occasional problems with balance.  And is asking for a Rollator prescription today.

## 2018-09-07 NOTE — Assessment & Plan Note (Signed)
Well controlled. Continue current regimen. Follow up in  6 months.  

## 2018-09-07 NOTE — Assessment & Plan Note (Signed)
Prescription given for Rollator.

## 2018-09-07 NOTE — Assessment & Plan Note (Signed)
Lab Results  Component Value Date   CHOL 128 01/10/2018   HDL 56 01/10/2018   LDLCALC 53 01/10/2018   LDLDIRECT 112 (H) 10/24/2007   TRIG 107 01/10/2018   CHOLHDL 2.3 01/10/2018   Current lipids are up-to-date.

## 2018-09-07 NOTE — Progress Notes (Signed)
Established Patient Office Visit  Subjective:  Patient ID: Paige Moore, female    DOB: 07/28/1945  Age: 73 y.o. MRN: [id number]  CC:  Chief Complaint  Patient presents with  . Diabetes    HPI NUR RABOLD presents for   Diabetes - no hypoglycemic events. No wounds or sores that are not healing well. No increased thirst or urination. Checking glucose at home. Taking medications as prescribed without any side effects.  She is overall doing really well.  Parkinson's-she is happy with her medication regimen she still continues to have a tremor in her right hand but does not feel like it is any worse than usual.  She does occasionally have problems with her balance and would like to have a Rollator especially if she is going to be out doing shopping or out for a long walk.  He does not feel like she really needs it around the house yet.     Past Medical History:  Diagnosis Date  . Essential tremor   . Hyperlipidemia   . OA (osteoarthritis)   . PONV (postoperative nausea and vomiting)   . Prediabetes   . VIN III (vulvar intraepithelial neoplasia III)   . Wears dentures    upper  . Wears glasses     Past Surgical History:  Procedure Laterality Date  . CESAREAN SECTION     X 2  . KNEE ARTHROSCOPY Left   . ORIF RIGHT ANKLE FX  1987   later hardware removed  . PLANTAR FASCIA RELEASE  12-02-2003   RIGHT HEEL  . TOTAL ABDOMINAL HYSTERECTOMY  age 70  . TOTAL HIP ARTHROPLASTY Left 7/ 2011    [facility]  . TOTAL KNEE ARTHROPLASTY Left 07-11-2010   [facility]  . VULVECTOMY Left 10/11/2013   Procedure: WIDE LOCAL EXCISON OF LEFT VULVA;  Surgeon: Everitt Amber, MD;  Location: [facility];  Service: Gynecology;  Laterality: Left;  . WRIST GANGLION EXCISION Left 2001    Family History  Problem Relation Age of Onset  . Diabetes Father   . Cancer Sister        endometrial cancer  . Osteoporosis Mother   . Hypothyroidism Mother     Social History   Socioeconomic  History  . Marital status: Widowed    Spouse name: Not on file  . Number of children: 2  . Years of education: 8  . Highest education level: 8th grade  Occupational History  . 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 Financial resource strain: Not hard at all  . Food insecurity    Worry: Not on file    Inability: Never true  . Transportation needs    Medical: No    Non-medical: No  Tobacco Use  . Smoking status: Former Smoker    Packs/day: 0.25    Years: 15.00    Pack years: 3.75    Types: Cigarettes    Quit date: 03/15/2006    Years since quitting: 12.4  . Smokeless tobacco: Never Used  Substance and Sexual Activity  . Alcohol use: No  . Drug use: No  . Sexual activity: Not Currently  Lifestyle  . Physical activity    Days per week: 7 days    Minutes per session: 60 min  . Stress: Not at all  Relationships  . Social connections    Talks on phone: Not on file    Gets together: Once a week    Attends  religious service: Never    Active member of club or organization: Not on file    Attends meetings of clubs or organizations: Never    Relationship status: Widowed  . Intimate partner violence    Fear of current or ex partner: No    Emotionally abused: No    Physically abused: No    Forced sexual activity: No  Other Topics Concern  . 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 Likes to walk in her neighborhood. Drinks a lot of water daiily    Outpatient Medications Prior to Visit  Medication Sig Dispense Refill  . acetaminophen (TYLENOL) 500 MG tablet Take by mouth. 1-2 tabs po tid prn     . AMBULATORY NON FORMULARY MEDICATION Medication Name: tdap vaccine. Single administration. 1 Syringe 0  . atorvastatin (LIPITOR) 40 MG tablet TAKE 1 TABLET BY MOUTH ONCE DAILY 90 tablet 3  . Blood Glucose Monitoring Suppl (ONE TOUCH ULTRA SYSTEM KIT) W/DEVICE KIT 1 kit by Does not apply route once. 1 each 0  .  carbidopa-levodopa (SINEMET IR) 10-100 MG tablet Take 1 tablet by mouth 3 (three) times daily. 270 tablet 3  . docusate sodium (COLACE) 100 MG capsule Take 1 capsule (100 mg total) by mouth 2 (two) times daily. 20 capsule 0  . glucose blood (ONE TOUCH ULTRA TEST) test strip USE 1 STRIP TO CHECK GLUCOSE ONCE DAILY 100 each 5  . ibuprofen (ADVIL,MOTRIN) 200 MG tablet Take 200 mg by mouth every 6 (six) hours as needed.    . metFORMIN (GLUCOPHAGE) 500 MG tablet TAKE 1 TABLET BY MOUTH TWICE DAILY WITH A MEAL 180 tablet 3  . ONETOUCH DELICA LANCETS 32G MISC USE 1  TO CHECK GLUCOSE ONCE DAILY 100 each 5   No facility-administered medications prior to visit.     Allergies  Allergen Reactions  . Requip [Ropinirole Hcl] Other (See Comments)    Left foot swelling.     ROS Review of Systems    Objective:    Physical Exam  Constitutional: She is oriented to person, place, and time. She appears well-developed and well-nourished.  HENT:  Head: Normocephalic and atraumatic.  Cardiovascular: Normal rate, regular rhythm and normal heart sounds.  Pulmonary/Chest: Effort normal and breath sounds normal.  Neurological: She is alert and oriented to person, place, and time.  Skin: Skin is warm and dry.  Psychiatric: She has a normal mood and affect. Her behavior is normal.    BP 132/82   Pulse 97   Ht '5\' 4"'  (1.626 m)   Wt 199 lb (90.3 kg)   SpO2 97%   BMI 34.16 kg/m  Wt Readings from Last 3 Encounters:  09/07/18 199 lb (90.3 kg)  05/22/18 206 lb (93.4 kg)  05/09/18 204 lb (92.5 kg)     Health Maintenance Due  Topic Date Due  . MAMMOGRAM  07/31/2018    There are no preventive care reminders to display for this patient.  Lab Results  Component Value Date   TSH 1.84 01/10/2018   Lab Results  Component Value Date   WBC 5.6 07/03/2012   HGB 14.9 02/02/2016   HCT 43.0 10/11/2013   MCV 90.1 07/03/2012   PLT 170 07/03/2012   Lab Results  Component Value Date   NA 142 05/09/2018    K 4.1 05/09/2018   CO2 23 05/09/2018   GLUCOSE 89 05/09/2018   BUN 19 05/09/2018   CREATININE 0.77 05/09/2018  BILITOT 0.5 01/10/2018   ALKPHOS 61 02/02/2016   AST 17 01/10/2018   ALT 13 01/10/2018   PROT 7.5 01/10/2018   ALBUMIN 4.4 02/02/2016   CALCIUM 9.7 05/09/2018   Lab Results  Component Value Date   CHOL 128 01/10/2018   Lab Results  Component Value Date   HDL 56 01/10/2018   Lab Results  Component Value Date   LDLCALC 53 01/10/2018   Lab Results  Component Value Date   TRIG 107 01/10/2018   Lab Results  Component Value Date   CHOLHDL 2.3 01/10/2018   Lab Results  Component Value Date   HGBA1C 6.0 (A) 09/07/2018      Assessment & Plan:   Problem List Items Addressed This Visit      Endocrine   Hyperlipidemia associated with type 2 diabetes mellitus ([place]) - Primary    Lab Results  Component Value Date   CHOL 128 01/10/2018   HDL 56 01/10/2018   LDLCALC 53 01/10/2018   LDLDIRECT 112 (H) 10/24/2007   TRIG 107 01/10/2018   CHOLHDL 2.3 01/10/2018   Current lipids are up-to-date.      Controlled diabetes mellitus type 2 with complications ([place])    Well controlled. Continue current regimen. Follow up in  6 months.       Relevant Orders   POCT glycosylated hemoglobin (Hb A1C) (Completed)     Nervous and Auditory   Parkinson disease ([place])    Feels current regimen is working adequately.  She does notice occasional problems with balance.  And is asking for a Rollator prescription today.      Relevant Medications   AMBULATORY NON FORMULARY MEDICATION     Other   Gait instability    Prescription given for Rollator.         Meds ordered this encounter  Medications  . AMBULATORY NON FORMULARY MEDICATION    Sig: Medication Name: Rollator with seat. Dx: Gait instability, Parkinson's    Dispense:  1 each    Refill:  0    Follow-up: Return in about 4 months (around 01/07/2019) for Diabetes follow-up.    Beatrice Lecher, MD

## 2018-09-19 ENCOUNTER — Telehealth: Payer: Self-pay

## 2018-09-19 DIAGNOSIS — G20 Parkinson's disease: Secondary | ICD-10-CM | POA: Diagnosis not present

## 2018-09-19 MED ORDER — AMBULATORY NON FORMULARY MEDICATION: 0 refills | Status: DC

## 2018-09-19 NOTE — Telephone Encounter (Signed)
Perfect!! Thank you. Will sign in place in basket

## 2018-09-19 NOTE — Telephone Encounter (Signed)
Patient's sister called stating that when patient was talking to [facility], she was advised that if Dr Madilyn Fireman wrote her an RX for a lift chair, she would be able to get it tax-free.  I have uploaded RX, please review and print and sign.   RX to be faxed to [phone] attention Verdie Drown

## 2018-09-20 NOTE — Telephone Encounter (Signed)
Faxed by Kenney Houseman

## 2018-10-09 DIAGNOSIS — H52203 Unspecified astigmatism, bilateral: Secondary | ICD-10-CM | POA: Diagnosis not present

## 2018-10-09 DIAGNOSIS — H02831 Dermatochalasis of right upper eyelid: Secondary | ICD-10-CM | POA: Diagnosis not present

## 2018-10-09 DIAGNOSIS — E119 Type 2 diabetes mellitus without complications: Secondary | ICD-10-CM | POA: Diagnosis not present

## 2018-10-09 DIAGNOSIS — H1851 Endothelial corneal dystrophy: Secondary | ICD-10-CM | POA: Diagnosis not present

## 2018-10-09 DIAGNOSIS — H25813 Combined forms of age-related cataract, bilateral: Secondary | ICD-10-CM | POA: Diagnosis not present

## 2018-10-09 HISTORY — PX: OTHER SURGICAL HISTORY: SHX169

## 2018-10-13 ENCOUNTER — Encounter: Payer: Self-pay | Admitting: *Deleted

## 2018-10-24 DIAGNOSIS — H52203 Unspecified astigmatism, bilateral: Secondary | ICD-10-CM | POA: Diagnosis not present

## 2018-10-24 DIAGNOSIS — H25813 Combined forms of age-related cataract, bilateral: Secondary | ICD-10-CM | POA: Diagnosis not present

## 2018-10-27 DIAGNOSIS — Z01812 Encounter for preprocedural laboratory examination: Secondary | ICD-10-CM | POA: Diagnosis not present

## 2018-10-27 DIAGNOSIS — H25813 Combined forms of age-related cataract, bilateral: Secondary | ICD-10-CM | POA: Diagnosis not present

## 2018-10-31 DIAGNOSIS — H25813 Combined forms of age-related cataract, bilateral: Secondary | ICD-10-CM | POA: Diagnosis not present

## 2018-10-31 DIAGNOSIS — H52203 Unspecified astigmatism, bilateral: Secondary | ICD-10-CM | POA: Diagnosis not present

## 2018-10-31 DIAGNOSIS — E785 Hyperlipidemia, unspecified: Secondary | ICD-10-CM | POA: Diagnosis not present

## 2018-10-31 DIAGNOSIS — H02831 Dermatochalasis of right upper eyelid: Secondary | ICD-10-CM | POA: Diagnosis not present

## 2018-10-31 DIAGNOSIS — H25811 Combined forms of age-related cataract, right eye: Secondary | ICD-10-CM | POA: Diagnosis not present

## 2018-10-31 DIAGNOSIS — Z79899 Other long term (current) drug therapy: Secondary | ICD-10-CM | POA: Diagnosis not present

## 2018-10-31 DIAGNOSIS — G20 Parkinson's disease: Secondary | ICD-10-CM | POA: Diagnosis not present

## 2018-10-31 DIAGNOSIS — H02834 Dermatochalasis of left upper eyelid: Secondary | ICD-10-CM | POA: Diagnosis not present

## 2018-10-31 DIAGNOSIS — H527 Unspecified disorder of refraction: Secondary | ICD-10-CM | POA: Diagnosis not present

## 2018-10-31 DIAGNOSIS — H43813 Vitreous degeneration, bilateral: Secondary | ICD-10-CM | POA: Diagnosis not present

## 2018-10-31 DIAGNOSIS — E1136 Type 2 diabetes mellitus with diabetic cataract: Secondary | ICD-10-CM | POA: Diagnosis not present

## 2018-11-01 DIAGNOSIS — H25812 Combined forms of age-related cataract, left eye: Secondary | ICD-10-CM | POA: Diagnosis not present

## 2018-11-01 DIAGNOSIS — Z961 Presence of intraocular lens: Secondary | ICD-10-CM | POA: Diagnosis not present

## 2018-11-01 DIAGNOSIS — H52203 Unspecified astigmatism, bilateral: Secondary | ICD-10-CM | POA: Diagnosis not present

## 2018-11-03 DIAGNOSIS — H25813 Combined forms of age-related cataract, bilateral: Secondary | ICD-10-CM | POA: Diagnosis not present

## 2018-11-03 DIAGNOSIS — Z01812 Encounter for preprocedural laboratory examination: Secondary | ICD-10-CM | POA: Diagnosis not present

## 2018-11-07 DIAGNOSIS — E1136 Type 2 diabetes mellitus with diabetic cataract: Secondary | ICD-10-CM | POA: Diagnosis not present

## 2018-11-07 DIAGNOSIS — H02834 Dermatochalasis of left upper eyelid: Secondary | ICD-10-CM | POA: Diagnosis not present

## 2018-11-07 DIAGNOSIS — H43813 Vitreous degeneration, bilateral: Secondary | ICD-10-CM | POA: Diagnosis not present

## 2018-11-07 DIAGNOSIS — M1990 Unspecified osteoarthritis, unspecified site: Secondary | ICD-10-CM | POA: Diagnosis not present

## 2018-11-07 DIAGNOSIS — H1851 Endothelial corneal dystrophy: Secondary | ICD-10-CM | POA: Diagnosis not present

## 2018-11-07 DIAGNOSIS — H2512 Age-related nuclear cataract, left eye: Secondary | ICD-10-CM | POA: Diagnosis not present

## 2018-11-07 DIAGNOSIS — E785 Hyperlipidemia, unspecified: Secondary | ICD-10-CM | POA: Diagnosis not present

## 2018-11-07 DIAGNOSIS — H25812 Combined forms of age-related cataract, left eye: Secondary | ICD-10-CM | POA: Diagnosis not present

## 2018-11-07 DIAGNOSIS — Z7984 Long term (current) use of oral hypoglycemic drugs: Secondary | ICD-10-CM | POA: Diagnosis not present

## 2018-11-07 DIAGNOSIS — H52203 Unspecified astigmatism, bilateral: Secondary | ICD-10-CM | POA: Diagnosis not present

## 2018-11-07 DIAGNOSIS — H25813 Combined forms of age-related cataract, bilateral: Secondary | ICD-10-CM | POA: Diagnosis not present

## 2018-11-07 DIAGNOSIS — H02831 Dermatochalasis of right upper eyelid: Secondary | ICD-10-CM | POA: Diagnosis not present

## 2018-11-07 DIAGNOSIS — G20 Parkinson's disease: Secondary | ICD-10-CM | POA: Diagnosis not present

## 2018-11-08 DIAGNOSIS — H52203 Unspecified astigmatism, bilateral: Secondary | ICD-10-CM | POA: Diagnosis not present

## 2018-11-08 DIAGNOSIS — H02831 Dermatochalasis of right upper eyelid: Secondary | ICD-10-CM | POA: Diagnosis not present

## 2018-11-08 DIAGNOSIS — Z961 Presence of intraocular lens: Secondary | ICD-10-CM | POA: Diagnosis not present

## 2018-11-08 DIAGNOSIS — E119 Type 2 diabetes mellitus without complications: Secondary | ICD-10-CM | POA: Diagnosis not present

## 2018-11-08 DIAGNOSIS — H1851 Endothelial corneal dystrophy: Secondary | ICD-10-CM | POA: Diagnosis not present

## 2018-11-08 LAB — HM DIABETES EYE EXAM

## 2019-01-03 ENCOUNTER — Encounter: Payer: Self-pay | Admitting: Family Medicine

## 2019-01-03 ENCOUNTER — Other Ambulatory Visit: Payer: Self-pay

## 2019-01-03 ENCOUNTER — Ambulatory Visit (INDEPENDENT_AMBULATORY_CARE_PROVIDER_SITE_OTHER): Payer: Medicare Other | Admitting: Family Medicine

## 2019-01-03 ENCOUNTER — Ambulatory Visit (INDEPENDENT_AMBULATORY_CARE_PROVIDER_SITE_OTHER): Payer: Medicare Other

## 2019-01-03 VITALS — BP 132/78 | HR 106 | Ht 64.0 in | Wt 198.0 lb

## 2019-01-03 DIAGNOSIS — Z1231 Encounter for screening mammogram for malignant neoplasm of breast: Secondary | ICD-10-CM

## 2019-01-03 DIAGNOSIS — Z23 Encounter for immunization: Secondary | ICD-10-CM

## 2019-01-03 DIAGNOSIS — G20 Parkinson's disease: Secondary | ICD-10-CM | POA: Diagnosis not present

## 2019-01-03 DIAGNOSIS — E119 Type 2 diabetes mellitus without complications: Secondary | ICD-10-CM

## 2019-01-03 DIAGNOSIS — M7732 Calcaneal spur, left foot: Secondary | ICD-10-CM | POA: Diagnosis not present

## 2019-01-03 DIAGNOSIS — E118 Type 2 diabetes mellitus with unspecified complications: Secondary | ICD-10-CM | POA: Diagnosis not present

## 2019-01-03 DIAGNOSIS — M25572 Pain in left ankle and joints of left foot: Secondary | ICD-10-CM

## 2019-01-03 DIAGNOSIS — E785 Hyperlipidemia, unspecified: Secondary | ICD-10-CM | POA: Diagnosis not present

## 2019-01-03 DIAGNOSIS — R809 Proteinuria, unspecified: Secondary | ICD-10-CM | POA: Insufficient documentation

## 2019-01-03 LAB — POCT GLYCOSYLATED HEMOGLOBIN (HGB A1C): Hemoglobin A1C: 5.9 % — AB (ref 4.0–5.6)

## 2019-01-03 LAB — POCT UA - MICROALBUMIN
Creatinine, POC: 10 mg/dL
Microalbumin Ur, POC: 10 mg/L

## 2019-01-03 MED ORDER — CARBIDOPA-LEVODOPA ER 25-100 MG PO TBCR: 1.0000 | EXTENDED_RELEASE_TABLET | Freq: Two times a day (BID) | ORAL | 1 refills | Status: DC

## 2019-01-03 MED ORDER — METFORMIN HCL 500 MG PO TABS: 500.0000 mg | ORAL_TABLET | Freq: Every day | ORAL | 0 refills | Status: DC

## 2019-01-03 MED ORDER — CARBIDOPA-LEVODOPA 10-100 MG PO TABS: 1.0000 | ORAL_TABLET | Freq: Three times a day (TID) | ORAL | 3 refills | Status: DC

## 2019-01-03 NOTE — Patient Instructions (Signed)
Thank you for coming in today. You will hear soon about xray results.  I am concerned about a possible fracture.  Next step will likely be CT scan or MRI and if no fracture injection or PT.   We will get results and plan to you ASAP.

## 2019-01-03 NOTE — Assessment & Plan Note (Signed)
We will switch to Sinemet CR to see if this is helpful since she does occasionally miss a dose.  Also adjust her dose she has any problems she can give [place] a call back otherwise I like to see her back in about 4 months.

## 2019-01-03 NOTE — Assessment & Plan Note (Signed)
Discussed that guidelines recommend starting a low-dose ACE inhibitor to help protect the kidneys in diabetics who have proteinuria really hesitant to add any medication to her regimen and would prefer not to but says she is willing to consider it so we will hold off for now and can discuss again at her follow-up.  We will certainly keep an eye on the microalbuminuria.

## 2019-01-03 NOTE — Assessment & Plan Note (Signed)
Her A1c looks phenomenal today.  Continue with metformin.  She is only taking it once a day she is really been doing a great job with diet control.  We also discussed that she did have some microalbuminuria present especially with a good A1c.  We discussed starting a low-dose ACE inhibitor for renal protection.  She wanted to think about it and maybe discuss it again at her next follow-up.

## 2019-01-03 NOTE — Progress Notes (Signed)
Paige Moore is a 73 y.o. female who presents to [facility] today for left ankle pain.  Patient has had pain in her left ankle for about 5 or 6 months now.  Pain occurred suddenly with no injury.  She denies does not recall any significant swelling or bruising or a pop.  She was seen by Dr. Nickola Major at [facility] who did x-rays which were reportedly normal.  She is not had much treatment for this.  She is tried some Advil which helps a little.  She notes has been multiple months since she last had assessment for this.  She denies any locking or catching or giving way.  Pain is worse with activity and better with rest.  Pain is predominantly located at the anterior lateral aspect of the ankle.   ROS:  As above  Exam:  BP 132/78   Pulse (!) 106   Ht '5\' 4"'  (1.626 m)   Wt 198 lb (89.8 kg)   SpO2 95%   BMI 33.99 kg/m  Wt Readings from Last 5 Encounters:  01/03/19 198 lb (89.8 kg)  09/07/18 199 lb (90.3 kg)  05/22/18 206 lb (93.4 kg)  05/09/18 204 lb (92.5 kg)  01/10/18 207 lb (93.9 kg)   General: Well Developed, well nourished, and in no acute distress.  Neuro/Psych: Alert and oriented x3, extra-ocular muscles intact, able to move all 4 extremities, sensation grossly intact.   Tremor at rest present Skin: Warm and dry, no rashes noted.  Respiratory: Not using accessory muscles, speaking in full sentences, trachea midline.  Cardiovascular: Pulses palpable, no extremity edema. Abdomen: Does not appear distended. MSK: Left ankle: Pronation with slight ankle inversion. Normal ankle motion. Mildly tender palpation ATFL region and at lateral malleolus Stable ligaments exam. Pulses cap refill and sensation are intact distally into the foot.    Lab and Radiology Results  X-ray images left ankle obtained today personally independently reviewed. Possible old partially healed fracture at distal fibula/lateral malleolus.  Degenerative  changes present throughout ankle as well. Await formal radiology review  Assessment and Plan: 73 y.o. female with 5 months left ankle pain occurring suddenly without injury.  X-rays initially at radiology were reportedly negative.  However I am concerned for possible healing or healed fracture at lateral malleolus on x-ray today.  Will await radiology over read.  Likely next step will be MRI or CT scan. Reluctant to use cam walker boot if possible here as she already has some gait difficulty secondary to her Parkinson's disease and that will increase her risk of fall.  However would like to confirm bone health etc. before using boot or cast.  Additionally would like to make sure the fracture is healed where there is no fracture before proceeding with steroid injection.  We will contact patient once we have x-ray report to discuss next steps.   PDMP not reviewed this encounter. Orders Placed This Encounter  Procedures  . DG Ankle Complete Left    Standing Status:   Future    Standing Expiration Date:   03/04/2020    Order Specific Question:   Reason for Exam (SYMPTOM  OR DIAGNOSIS REQUIRED)    Answer:   eval 5 months lateral ankle pain    Order Specific Question:   Preferred imaging location?    Answer:   [facility]    Order Specific Question:   Radiology Contrast Protocol - do NOT remove file path    Answer:   \\  [email or web address]   No orders of the defined types were placed in this encounter.   Historical information moved to improve visibility of documentation.  Past Medical History:  Diagnosis Date  . Essential tremor   . Hyperlipidemia   . OA (osteoarthritis)   . PONV (postoperative nausea and vomiting)   . Prediabetes   . VIN III (vulvar intraepithelial neoplasia III)   . Wears dentures    upper  . Wears glasses    Past Surgical History:  Procedure Laterality Date  . CESAREAN SECTION     X 2  . Corneal topography   10/09/2018  . KNEE ARTHROSCOPY Left   . ORIF RIGHT ANKLE FX  1987   later hardware removed  . PLANTAR FASCIA RELEASE  12-02-2003   RIGHT HEEL  . TOTAL ABDOMINAL HYSTERECTOMY  age 65  . TOTAL HIP ARTHROPLASTY Left 7/ 2011    [facility]  . TOTAL KNEE ARTHROPLASTY Left 07-11-2010   [facility]  . VULVECTOMY Left 10/11/2013   Procedure: WIDE LOCAL EXCISON OF LEFT VULVA;  Surgeon: Everitt Amber, MD;  Location: [facility];  Service: Gynecology;  Laterality: Left;  . WRIST GANGLION EXCISION Left 2001   Social History   Tobacco Use  . Smoking status: Former Smoker    Packs/day: 0.25    Years: 15.00    Pack years: 3.75    Types: Cigarettes    Quit date: 03/15/2006    Years since quitting: 12.8  . Smokeless tobacco: Never Used  Substance Use Topics  . Alcohol use: No   family history includes Cancer in her sister; Diabetes in her father; Hypothyroidism in her mother; Osteoporosis in her mother.  Medications: Current Outpatient Medications  Medication Sig Dispense Refill  . acetaminophen (TYLENOL) 500 MG tablet Take by mouth. 1-2 tabs po tid prn     . atorvastatin (LIPITOR) 40 MG tablet TAKE 1 TABLET BY MOUTH ONCE DAILY 90 tablet 3  . Blood Glucose Monitoring Suppl (ONE TOUCH ULTRA SYSTEM Paige) W/DEVICE Paige 1 Paige by Does not apply route once. 1 each 0  . Carbidopa-Levodopa ER (SINEMET CR) 25-100 MG tablet controlled release Take 1 tablet by mouth 2 (two) times daily. 180 tablet 1  . docusate sodium (COLACE) 100 MG capsule Take 1 capsule (100 mg total) by mouth 2 (two) times daily. 20 capsule 0  . glucose blood (ONE TOUCH ULTRA TEST) test strip USE 1 STRIP TO CHECK GLUCOSE ONCE DAILY 100 each 5  . ibuprofen (ADVIL,MOTRIN) 200 MG tablet Take 200 mg by mouth every 6 (six) hours as needed.    . metFORMIN (GLUCOPHAGE) 500 MG tablet TAKE 1 TABLET BY MOUTH TWICE DAILY WITH A MEAL 180 tablet 3  . Omega-3 1000 MG CAPS Take by mouth.    Glory Rosebush DELICA LANCETS 30Z MISC USE 1  TO CHECK GLUCOSE  ONCE DAILY 100 each 5   No current facility-administered medications for this visit.    Allergies  Allergen Reactions  . Ropinirole Hcl Other (See Comments)    Left foot swelling.  Other reaction(s): Other Left foot swelling.       Discussed warning signs or symptoms. Please see discharge instructions. Patient expresses understanding.

## 2019-01-03 NOTE — Progress Notes (Signed)
Established Patient Office Visit  Subjective:  Patient ID: Paige Moore, female    DOB: [birth year]  Age: 73 y.o. MRN: [id number]  CC:  Chief Complaint  Patient presents with  . Diabetes    HPI Paige Moore presents for   Diabetes - no hypoglycemic events. No wounds or sores that are not healing well. No increased thirst or urination. Checking glucose at home. Taking medications as prescribed without any side effects.  F/U Parkinson's Disease -she is doing well on her Sinemet IR.  She takes it 3 times a day.  Occasionally she will miss a dose and can tell a big difference.  She was wanting to know if we could go up on her dose at all.  But overall she feels like it is very effective.  She forgot to take it this morning on her way here so does have a little bit more of a tremor this morning.  Hyperlipidemia - tolerating stating well with no myalgias or significant side effects.   Lab Results  Component Value Date   CHOL 128 01/10/2018   CHOL 133 12/09/2016   CHOL 111 (L) 10/01/2015   Lab Results  Component Value Date   HDL 56 01/10/2018   HDL 58 12/09/2016   HDL 56 10/01/2015   Lab Results  Component Value Date   LDLCALC 53 01/10/2018   LDLCALC 56 12/09/2016   LDLCALC 31 10/01/2015   Lab Results  Component Value Date   TRIG 107 01/10/2018   TRIG 109 12/09/2016   TRIG 122 10/01/2015   Lab Results  Component Value Date   CHOLHDL 2.3 01/10/2018   CHOLHDL 2.3 12/09/2016   CHOLHDL 2.0 10/01/2015   Lab Results  Component Value Date   LDLDIRECT 112 (H) 10/24/2007     Past Medical History:  Diagnosis Date  . Essential tremor   . Hyperlipidemia   . OA (osteoarthritis)   . PONV (postoperative nausea and vomiting)   . Prediabetes   . VIN III (vulvar intraepithelial neoplasia III)   . Wears dentures    upper  . Wears glasses     Past Surgical History:  Procedure Laterality Date  . CESAREAN SECTION     X 2  . Corneal topography  10/09/2018  . KNEE  ARTHROSCOPY Left   . ORIF RIGHT ANKLE FX  1987   later hardware removed  . PLANTAR FASCIA RELEASE  12-02-2003   RIGHT HEEL  . TOTAL ABDOMINAL HYSTERECTOMY  age 48  . TOTAL HIP ARTHROPLASTY Left 7/ 2011    [facility]  . TOTAL KNEE ARTHROPLASTY Left 07-11-2010   [facility]  . VULVECTOMY Left 10/11/2013   Procedure: WIDE LOCAL EXCISON OF LEFT VULVA;  Surgeon: Everitt Amber, MD;  Location: [facility];  Service: Gynecology;  Laterality: Left;  . WRIST GANGLION EXCISION Left 2001    Family History  Problem Relation Age of Onset  . Diabetes Father   . Cancer Sister        endometrial cancer  . Osteoporosis Mother   . Hypothyroidism Mother     Social History   Socioeconomic History  . Marital status: Widowed    Spouse name: Not on file  . Number of children: 2  . Years of education: 8  . Highest education level: 8th grade  Occupational History  . 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 Financial resource strain: Not hard at all  . Food insecurity  Worry: Not on file    Inability: Never true  . Transportation needs    Medical: No    Non-medical: No  Tobacco Use  . Smoking status: Former Smoker    Packs/day: 0.25    Years: 15.00    Pack years: 3.75    Types: Cigarettes    Quit date: 03/15/2006    Years since quitting: 12.8  . Smokeless tobacco: Never Used  Substance and Sexual Activity  . Alcohol use: No  . Drug use: No  . Sexual activity: Not Currently  Lifestyle  . Physical activity    Days per week: 7 days    Minutes per session: 60 min  . Stress: Not at all  Relationships  . Social Herbalist on phone: Not on file    Gets together: Once a week    Attends religious service: Never    Active member of club or organization: Not on file    Attends meetings of clubs or organizations: Never    Relationship status: Widowed  . Intimate partner violence    Fear of current or ex partner: No    Emotionally abused: No     Physically abused: No    Forced sexual activity: No  Other Topics Concern  . 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 Likes to walk in her neighborhood. Drinks a lot of water daiily    Outpatient Medications Prior to Visit  Medication Sig Dispense Refill  . Omega-3 1000 MG CAPS Take by mouth.    Marland Kitchen acetaminophen (TYLENOL) 500 MG tablet Take by mouth. 1-2 tabs po tid prn     . atorvastatin (LIPITOR) 40 MG tablet TAKE 1 TABLET BY MOUTH ONCE DAILY 90 tablet 3  . Blood Glucose Monitoring Suppl (ONE TOUCH ULTRA SYSTEM KIT) W/DEVICE KIT 1 kit by Does not apply route once. 1 each 0  . docusate sodium (COLACE) 100 MG capsule Take 1 capsule (100 mg total) by mouth 2 (two) times daily. 20 capsule 0  . glucose blood (ONE TOUCH ULTRA TEST) test strip USE 1 STRIP TO CHECK GLUCOSE ONCE DAILY 100 each 5  . ibuprofen (ADVIL,MOTRIN) 200 MG tablet Take 200 mg by mouth every 6 (six) hours as needed.    Glory Rosebush DELICA LANCETS 67H MISC USE 1  TO CHECK GLUCOSE ONCE DAILY 100 each 5  . AMBULATORY NON FORMULARY MEDICATION Medication Name: tdap vaccine. Single administration. 1 Syringe 0  . AMBULATORY NON FORMULARY MEDICATION Medication Name: Rollator with seat. Dx: Gait instability, Parkinson's 1 each 0  . AMBULATORY NON FORMULARY MEDICATION Lift Chair DX: Gait instability, Parkinsons 1 each 0  . carbidopa-levodopa (SINEMET IR) 10-100 MG tablet Take 1 tablet by mouth 3 (three) times daily. 270 tablet 3  . metFORMIN (GLUCOPHAGE) 500 MG tablet TAKE 1 TABLET BY MOUTH TWICE DAILY WITH A MEAL 180 tablet 3   No facility-administered medications prior to visit.     Allergies  Allergen Reactions  . Ropinirole Hcl Other (See Comments)    Left foot swelling.  Other reaction(s): Other Left foot swelling.     ROS Review of Systems    Objective:    Physical Exam  BP 132/78   Pulse (!) 106   Ht '5\' 4"'  (1.626 m)   Wt 198 lb (89.8 kg)   SpO2 95%   BMI  33.99 kg/m  Wt Readings from Last 3 Encounters:  01/03/19 198 lb (89.8  kg)  09/07/18 199 lb (90.3 kg)  05/22/18 206 lb (93.4 kg)     Health Maintenance Due  Topic Date Due  . MAMMOGRAM  07/31/2018    There are no preventive care reminders to display for this patient.  Lab Results  Component Value Date   TSH 1.84 01/10/2018   Lab Results  Component Value Date   WBC 5.6 07/03/2012   HGB 14.9 02/02/2016   HCT 43.0 10/11/2013   MCV 90.1 07/03/2012   PLT 170 07/03/2012   Lab Results  Component Value Date   NA 142 05/09/2018   K 4.1 05/09/2018   CO2 23 05/09/2018   GLUCOSE 89 05/09/2018   BUN 19 05/09/2018   CREATININE 0.77 05/09/2018   BILITOT 0.5 01/10/2018   ALKPHOS 61 02/02/2016   AST 17 01/10/2018   ALT 13 01/10/2018   PROT 7.5 01/10/2018   ALBUMIN 4.4 02/02/2016   CALCIUM 9.7 05/09/2018   Lab Results  Component Value Date   CHOL 128 01/10/2018   Lab Results  Component Value Date   HDL 56 01/10/2018   Lab Results  Component Value Date   LDLCALC 53 01/10/2018   Lab Results  Component Value Date   TRIG 107 01/10/2018   Lab Results  Component Value Date   CHOLHDL 2.3 01/10/2018   Lab Results  Component Value Date   HGBA1C 5.9 (A) 01/03/2019      Assessment & Plan:   Problem List Items Addressed This Visit      Endocrine   Controlled diabetes mellitus type 2 with complications ([place]) - Primary    Her A1c looks phenomenal today.  Continue with metformin.  She is only taking it once a day she is really been doing a great job with diet control.  We also discussed that she did have some microalbuminuria present especially with a good A1c.  We discussed starting a low-dose ACE inhibitor for renal protection.  She wanted to think about it and maybe discuss it again at her next follow-up.      Relevant Medications   metFORMIN (GLUCOPHAGE) 500 MG tablet   Other Relevant Orders   POCT glycosylated hemoglobin (Hb A1C) (Completed)   POCT UA -  Microalbumin (Completed)     Nervous and Auditory   Parkinson disease ([place])    We will switch to Sinemet CR to see if this is helpful since she does occasionally miss a dose.  Also adjust her dose she has any problems she can give [place] a call back otherwise I like to see her back in about 4 months.      Relevant Medications   Carbidopa-Levodopa ER (SINEMET CR) 25-100 MG tablet controlled release     Other   Microalbuminuria    Discussed that guidelines recommend starting a low-dose ACE inhibitor to help protect the kidneys in diabetics who have proteinuria really hesitant to add any medication to her regimen and would prefer not to but says she is willing to consider it so we will hold off for now and can discuss again at her follow-up.  We will certainly keep an eye on the microalbuminuria.      Hyperlipidemia    Other Visit Diagnoses    Need for immunization against influenza       Relevant Orders   Flu Vaccine QUAD High Dose(Fluad) (Completed)   Screening mammogram, encounter for       Relevant Orders   MM 3D SCREEN BREAST BILATERAL   Controlled type  2 diabetes mellitus without complication, without long-term current use of insulin (HCC)       Relevant Medications   metFORMIN (GLUCOPHAGE) 500 MG tablet      Meds ordered this encounter  Medications  . DISCONTD: carbidopa-levodopa (SINEMET IR) 10-100 MG tablet    Sig: Take 1 tablet by mouth 3 (three) times daily.    Dispense:  270 tablet    Refill:  3    *PLEASE HOLD THIS UNTIL PATIENT CALLS. THIS IS NOT DUE UNTIL 02/2019*  . Carbidopa-Levodopa ER (SINEMET CR) 25-100 MG tablet controlled release    Sig: Take 1 tablet by mouth 2 (two) times daily.    Dispense:  180 tablet    Refill:  1  . metFORMIN (GLUCOPHAGE) 500 MG tablet    Sig: Take 1 tablet (500 mg total) by mouth daily with breakfast.    Dispense:  1 tablet    Refill:  0    Follow-up: Return in about 4 months (around 05/06/2019) for Diabetes follow-up and Parkinson.     Beatrice Lecher, MD

## 2019-01-03 NOTE — Patient Instructions (Signed)
I am going to change your Sinemet to the twice a day version. It looks like it is covered under you insurance and then you don't have to take it 3 times a day.

## 2019-01-04 ENCOUNTER — Telehealth: Payer: Self-pay | Admitting: Family Medicine

## 2019-01-04 DIAGNOSIS — S82899A Other fracture of unspecified lower leg, initial encounter for closed fracture: Secondary | ICD-10-CM

## 2019-01-04 NOTE — Telephone Encounter (Signed)
Radiology did not see a fracture.  I did discuss this and reviewed the x-rays with my colleague Dr. Darene Lamer who is also worried about fracture along with me.  Plan for MRI to further characterize soft tissue versus bony injury.  MRI should be done in the near future.

## 2019-01-05 NOTE — Telephone Encounter (Signed)
Patient advised of results and recommendations. She has agreed to the MRI.

## 2019-01-09 ENCOUNTER — Ambulatory Visit (INDEPENDENT_AMBULATORY_CARE_PROVIDER_SITE_OTHER): Payer: Medicare Other

## 2019-01-09 ENCOUNTER — Other Ambulatory Visit: Payer: Self-pay

## 2019-01-09 DIAGNOSIS — M19072 Primary osteoarthritis, left ankle and foot: Secondary | ICD-10-CM | POA: Diagnosis not present

## 2019-01-09 DIAGNOSIS — M25472 Effusion, left ankle: Secondary | ICD-10-CM | POA: Diagnosis not present

## 2019-01-09 DIAGNOSIS — S82899A Other fracture of unspecified lower leg, initial encounter for closed fracture: Secondary | ICD-10-CM | POA: Diagnosis not present

## 2019-01-09 DIAGNOSIS — S96812A Strain of other specified muscles and tendons at ankle and foot level, left foot, initial encounter: Secondary | ICD-10-CM | POA: Diagnosis not present

## 2019-01-09 DIAGNOSIS — M2142 Flat foot [pes planus] (acquired), left foot: Secondary | ICD-10-CM | POA: Diagnosis not present

## 2019-01-16 ENCOUNTER — Ambulatory Visit: Payer: Medicare Other | Admitting: Sports Medicine

## 2019-03-07 DIAGNOSIS — Z719 Counseling, unspecified: Secondary | ICD-10-CM | POA: Diagnosis not present

## 2019-04-23 ENCOUNTER — Encounter: Payer: Self-pay | Admitting: Family Medicine

## 2019-04-23 ENCOUNTER — Telehealth: Payer: Self-pay | Admitting: Family Medicine

## 2019-04-23 NOTE — Telephone Encounter (Signed)
Left message advising letter will be left up front for pick up.

## 2019-04-23 NOTE — Telephone Encounter (Signed)
Patient sister called and the patient is need of a letter to change her bathtub into a shower due to her disability. Is this ok to write for this patient? The facility where she is at will change the tub into a shower so its easier to get in and out for bathing. Please advise.

## 2019-04-23 NOTE — Telephone Encounter (Signed)
OK for letter

## 2019-05-09 NOTE — Progress Notes (Deleted)
Subjective:   Paige Moore is a 74 y.o. female who presents for Medicare Annual (Subsequent) preventive examination.  Review of Systems:  No ROS.  Medicare Wellness Virtual Visit.  Visual/audio telehealth visit, UTA vital signs.   See social history for additional risk factors.      Sleep patterns:    Home Safety/Smoke Alarms: Feels safe in home. Smoke alarms in place.  Living environment;  Seat Belt Safety/Bike Helmet: Wears seat belt.   Female:   Pap- Aged out      Mammo- UTD    Dexa scan-        CCS- UTD    Objective:     Vitals: There were no vitals taken for this visit.  There is no height or weight on file to calculate BMI.  Advanced Directives 05/04/2016 10/11/2013  Does Patient Have a Medical Advance Directive? Yes Patient has advance directive, copy not in chart  Type of Advance Directive [place];Living will [place]  Does patient want to make changes to medical advance directive? No - Patient declined -  Copy of [place] in Chart? No - copy requested -    Tobacco Social History   Tobacco Use  Smoking Status Former Smoker  . Packs/day: 0.25  . Years: 15.00  . Pack years: 3.75  . Types: Cigarettes  . Quit date: 03/15/2006  . Years since quitting: 13.1  Smokeless Tobacco Never Used     Counseling given: Not Answered   Clinical Intake:                       Past Medical History:  Diagnosis Date  . Essential tremor   . Hyperlipidemia   . OA (osteoarthritis)   . PONV (postoperative nausea and vomiting)   . Prediabetes   . VIN III (vulvar intraepithelial neoplasia III)   . Wears dentures    upper  . Wears glasses    Past Surgical History:  Procedure Laterality Date  . CESAREAN SECTION     X 2  . Corneal topography  10/09/2018  . KNEE ARTHROSCOPY Left   . ORIF RIGHT ANKLE FX  1987   later hardware removed  . PLANTAR FASCIA RELEASE  12-02-2003   RIGHT HEEL  . TOTAL  ABDOMINAL HYSTERECTOMY  age 39  . TOTAL HIP ARTHROPLASTY Left 7/ 2011    [facility]  . TOTAL KNEE ARTHROPLASTY Left 07-11-2010   [facility]  . VULVECTOMY Left 10/11/2013   Procedure: WIDE LOCAL EXCISON OF LEFT VULVA;  Surgeon: Everitt Amber, MD;  Location: [facility];  Service: Gynecology;  Laterality: Left;  . WRIST GANGLION EXCISION Left 2001   Family History  Problem Relation Age of Onset  . Diabetes Father   . Cancer Sister        endometrial cancer  . Osteoporosis Mother   . Hypothyroidism Mother    Social History   Socioeconomic History  . Marital status: Widowed    Spouse name: Not on file  . Number of children: 2  . Years of education: 8  . Highest education level: 8th grade  Occupational History  . Occupation: [employer]    Comment: retired  Tobacco Use  . Smoking status: Former Smoker    Packs/day: 0.25    Years: 15.00    Pack years: 3.75    Types: Cigarettes    Quit date: 03/15/2006    Years since quitting: 13.1  .  Smokeless tobacco: Never Used  Substance and Sexual Activity  . Alcohol use: No  . Drug use: No  . Sexual activity: Not Currently  Other Topics Concern  . 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 Likes to walk in her neighborhood. Drinks a lot of water daiily   Social Determinants of Health   Financial Resource Strain: Low Risk   . Difficulty of Paying Living Expenses: Not hard at all  Food Insecurity: Unknown  . Worried About Charity fundraiser in the Last Year: Not on file  . Ran Out of Food in the Last Year: Never true  Transportation Needs: No Transportation Needs  . Lack of Transportation (Medical): No  . Lack of Transportation (Non-Medical): No  Physical Activity: Sufficiently Active  . Days of Exercise per Week: 7 days  . Minutes of Exercise per Session: 60 min  Stress: No Stress Concern Present  . Feeling of Stress : Not at all  Social Connections: Unknown  . Frequency  of Communication with Friends and Family: Not on file  . Frequency of Social Gatherings with Friends and Family: Once a week  . Attends Religious Services: Never  . Active Member of Clubs or Organizations: Not on file  . Attends Archivist Meetings: Never  . Marital Status: Widowed    Outpatient Encounter Medications as of 05/22/2019  Medication Sig  . acetaminophen (TYLENOL) 500 MG tablet Take by mouth. 1-2 tabs po tid prn   . atorvastatin (LIPITOR) 40 MG tablet TAKE 1 TABLET BY MOUTH ONCE DAILY  . Blood Glucose Monitoring Suppl (ONE TOUCH ULTRA SYSTEM KIT) W/DEVICE KIT 1 kit by Does not apply route once.  . Carbidopa-Levodopa ER (SINEMET CR) 25-100 MG tablet controlled release Take 1 tablet by mouth 2 (two) times daily.  Marland Kitchen docusate sodium (COLACE) 100 MG capsule Take 1 capsule (100 mg total) by mouth 2 (two) times daily.  Marland Kitchen glucose blood (ONE TOUCH ULTRA TEST) test strip USE 1 STRIP TO CHECK GLUCOSE ONCE DAILY  . ibuprofen (ADVIL,MOTRIN) 200 MG tablet Take 200 mg by mouth every 6 (six) hours as needed.  . metFORMIN (GLUCOPHAGE) 500 MG tablet Take 1 tablet (500 mg total) by mouth daily with breakfast.  . Omega-3 1000 MG CAPS Take by mouth.  Glory Rosebush DELICA LANCETS 45T MISC USE 1  TO CHECK GLUCOSE ONCE DAILY   No facility-administered encounter medications on file as of 05/22/2019.    Activities of Daily Living In your present state of health, do you have any difficulty performing the following activities: 05/22/2018 05/22/2018  Hearing? N N  Vision? N N  Difficulty concentrating or making decisions? N N  Walking or climbing stairs? N N  Dressing or bathing? N N  Doing errands, shopping? N -  Preparing Food and eating ? N N  Using the Toilet? N N  In the past six months, have you accidently leaked urine? N N  Do you have problems with loss of bowel control? N N  Managing your Medications? N N  Managing your Finances? N N  Housekeeping or managing your Housekeeping? N N   Some recent data might be hidden    Patient Care Team: Hali Marry, MD as PCP - General (Family Medicine)    Assessment:   This is a routine wellness examination for Paige Moore.Physical assessment deferred to PCP.   Exercise Activities and Dietary recommendations   Diet  Breakfast: Lunch:  Dinner:       Goals    . Exercise 3x per week (30 min per time) (pt-stated)     She would like to walk for at least 20 minutes for 3-4 times per week.       Fall Risk Fall Risk  01/03/2019 05/22/2018 05/17/2017 05/04/2016 10/01/2015  Falls in the past year? 0 0 No No No  Number falls in past yr: 0 0 - - -  Injury with Fall? 0 - - - -  Follow up - Falls prevention discussed - - -   Is the patient's home free of loose throw rugs in walkways, pet beds, electrical cords, etc?   {Blank single:19197::"yes","no"}      Grab bars in the bathroom? {Blank single:19197::"yes","no"}      Handrails on the stairs?   {Blank single:19197::"yes","no"}      Adequate lighting?   {Blank single:19197::"yes","no"}  Depression Screen PHQ 2/9 Scores 01/03/2019 05/22/2018 05/17/2017 04/19/2017  PHQ - 2 Score 0 0 0 0     Cognitive Function     6CIT Screen 05/22/2018 05/04/2016  What Year? - 0 points  What month? - 0 points  What time? 0 points 0 points  Count back from 20 0 points 0 points  Months in reverse 2 points -  Repeat phrase 0 points 0 points    Immunization History  Administered Date(s) Administered  . Fluad Quad(high Dose 65+) 01/03/2019  . Influenza Split 12/16/2010, 12/27/2011  . Influenza Whole 12/27/2005, 01/31/2007, 12/22/2009  . Influenza, High Dose Seasonal PF 12/19/2015, 12/09/2016, 01/10/2018  . Influenza,inj,Quad PF,6+ Mos 01/08/2013, 12/13/2013, 12/24/2014  . Pneumococcal Conjugate-13 09/23/2014  . Pneumococcal Polysaccharide-23 06/23/2011  . Tdap 03/15/2012  . Zoster 04/15/2012    Screening Tests Health Maintenance  Topic Date Due  . HEMOGLOBIN A1C  07/04/2019  .  OPHTHALMOLOGY EXAM  11/08/2019  . FOOT EXAM  01/03/2020  . URINE MICROALBUMIN  01/03/2020  . MAMMOGRAM  01/02/2021  . COLONOSCOPY  02/16/2021  . TETANUS/TDAP  03/15/2022  . INFLUENZA VACCINE  Completed  . DEXA SCAN  Completed  . Hepatitis C Screening  Completed  . PNA vac Low Risk Adult  Completed      Plan:   ***   I have personally reviewed and noted the following in the patient's chart:   . Medical and social history . Use of alcohol, tobacco or illicit drugs  . Current medications and supplements . Functional ability and status . Nutritional status . Physical activity . Advanced directives . List of other physicians . Hospitalizations, surgeries, and ER visits in previous 12 months . Vitals . Screenings to include cognitive, depression, and falls . Referrals and appointments  In addition, I have reviewed and discussed with patient certain preventive protocols, quality metrics, and best practice recommendations. A written personalized care plan for preventive services as well as general preventive health recommendations were provided to patient.     Joanne Chars, LPN  06/11/1914

## 2019-05-15 ENCOUNTER — Ambulatory Visit (INDEPENDENT_AMBULATORY_CARE_PROVIDER_SITE_OTHER): Payer: Medicare Other | Admitting: Family Medicine

## 2019-05-15 ENCOUNTER — Encounter: Payer: Self-pay | Admitting: Family Medicine

## 2019-05-15 VITALS — BP 135/79 | HR 103 | Ht 64.0 in | Wt 197.0 lb

## 2019-05-15 DIAGNOSIS — E118 Type 2 diabetes mellitus with unspecified complications: Secondary | ICD-10-CM | POA: Diagnosis not present

## 2019-05-15 DIAGNOSIS — E119 Type 2 diabetes mellitus without complications: Secondary | ICD-10-CM | POA: Diagnosis not present

## 2019-05-15 DIAGNOSIS — E785 Hyperlipidemia, unspecified: Secondary | ICD-10-CM

## 2019-05-15 DIAGNOSIS — G20 Parkinson's disease: Secondary | ICD-10-CM | POA: Diagnosis not present

## 2019-05-15 DIAGNOSIS — E1169 Type 2 diabetes mellitus with other specified complication: Secondary | ICD-10-CM

## 2019-05-15 LAB — POCT GLYCOSYLATED HEMOGLOBIN (HGB A1C): Hemoglobin A1C: 5.7 % — AB (ref 4.0–5.6)

## 2019-05-15 MED ORDER — CARBIDOPA-LEVODOPA ER 25-100 MG PO TBCR: 1.0000 | EXTENDED_RELEASE_TABLET | Freq: Two times a day (BID) | ORAL | 1 refills | Status: DC

## 2019-05-15 MED ORDER — METFORMIN HCL 500 MG PO TABS: 500.0000 mg | ORAL_TABLET | Freq: Every day | ORAL | 3 refills | Status: DC

## 2019-05-15 MED ORDER — ATORVASTATIN CALCIUM 40 MG PO TABS: 40.0000 mg | ORAL_TABLET | Freq: Every day | ORAL | 3 refills | Status: DC

## 2019-05-15 NOTE — Assessment & Plan Note (Signed)
A1c looks fantastic today at 5.7.  Improved from previous of 5.9.  Continue current regimen.  Follow-up in 4 months.

## 2019-05-15 NOTE — Progress Notes (Signed)
Established Patient Office Visit  Subjective:  Patient ID: Paige Moore, female    DOB: 1945/05/30  Age: 74 y.o. MRN: [id number]  CC:  Chief Complaint  Patient presents with  . Diabetes    HPI Paige Moore presents for   Diabetes - no hypoglycemic events. No wounds or sores that are not healing well. No increased thirst or urination. Checking glucose at home. Taking medications as prescribed without any side effects.   F/U Parkinson disease-overall she is doing well but has noticed that she started to get a tremor in her left hand.  Right now it is not causing any major dysfunction she says she just has to do things a little bit more slowly.  Her original presentation was in her right hand.  She is happy with her Sinemet CR dosing and does need refills sent for the next 6 months.  She did let me know that she has been using her walker a little bit more at home more because of balance problems she denies any numbness or tingling in the feet or weakness in her lower extremities.  She says sometimes she just does not feel quite as sure footed.  She denies feeling lightheaded or dizzy.  Past Medical History:  Diagnosis Date  . Essential tremor   . Hyperlipidemia   . OA (osteoarthritis)   . PONV (postoperative nausea and vomiting)   . Prediabetes   . VIN III (vulvar intraepithelial neoplasia III)   . Wears dentures    upper  . Wears glasses     Past Surgical History:  Procedure Laterality Date  . CESAREAN SECTION     X 2  . Corneal topography  10/09/2018  . KNEE ARTHROSCOPY Left   . ORIF RIGHT ANKLE FX  1987   later hardware removed  . PLANTAR FASCIA RELEASE  12-02-2003   RIGHT HEEL  . TOTAL ABDOMINAL HYSTERECTOMY  age 26  . TOTAL HIP ARTHROPLASTY Left 7/ 2011    [facility]  . TOTAL KNEE ARTHROPLASTY Left 07-11-2010   [facility]  . VULVECTOMY Left 10/11/2013   Procedure: WIDE LOCAL EXCISON OF LEFT VULVA;  Surgeon: Everitt Amber, MD;  Location: [facility];   Service: Gynecology;  Laterality: Left;  . WRIST GANGLION EXCISION Left 2001    Family History  Problem Relation Age of Onset  . Diabetes Father   . Cancer Sister        endometrial cancer  . Osteoporosis Mother   . Hypothyroidism Mother     Social History   Socioeconomic History  . Marital status: Widowed    Spouse name: Not on file  . Number of children: 2  . Years of education: 8  . Highest education level: 8th grade  Occupational History  . Occupation: [employer]    Comment: retired  Tobacco Use  . Smoking status: Former Smoker    Packs/day: 0.25    Years: 15.00    Pack years: 3.75    Types: Cigarettes    Quit date: 03/15/2006    Years since quitting: 13.1  . Smokeless tobacco: Never Used  Substance and Sexual Activity  . Alcohol use: No  . Drug use: No  . Sexual activity: Not Currently  Other Topics Concern  . Not on file  Social History Narrative   Retired from [employer] vs[employer] and stays nat home,.Walks. Likes to walk in her neighborhood. Drinks a lot of water daiily   Social Determinants of Radio broadcast assistant  Strain: Low Risk   . Difficulty of Paying Living Expenses: Not hard at all  Food Insecurity: Unknown  . Worried About Charity fundraiser in the Last Year: Not on file  . Ran Out of Food in the Last Year: Never true  Transportation Needs: No Transportation Needs  . Lack of Transportation (Medical): No  . Lack of Transportation (Non-Medical): No  Physical Activity: Sufficiently Active  . Days of Exercise per Week: 7 days  . Minutes of Exercise per Session: 60 min  Stress: No Stress Concern Present  . Feeling of Stress : Not at all  Social Connections: Unknown  . Frequency of Communication with Friends and Family: Not on file  . Frequency of Social Gatherings with Friends and Family: Once a week  . Attends Religious Services: Never  . Active Member of Clubs or Organizations: Not on file  . Attends Archivist Meetings: Never  .  Marital Status: Widowed  Intimate Partner Violence: Not At Risk  . Fear of Current or Ex-Partner: No  . Emotionally Abused: No  . Physically Abused: No  . Sexually Abused: No    Outpatient Medications Prior to Visit  Medication Sig Dispense Refill  . acetaminophen (TYLENOL) 500 MG tablet Take by mouth. 1-2 tabs po tid prn     . Blood Glucose Monitoring Suppl (ONE TOUCH ULTRA SYSTEM KIT) W/DEVICE KIT 1 kit by Does not apply route once. 1 each 0  . docusate sodium (COLACE) 100 MG capsule Take 1 capsule (100 mg total) by mouth 2 (two) times daily. 20 capsule 0  . glucose blood (ONE TOUCH ULTRA TEST) test strip USE 1 STRIP TO CHECK GLUCOSE ONCE DAILY 100 each 5  . ibuprofen (ADVIL,MOTRIN) 200 MG tablet Take 200 mg by mouth every 6 (six) hours as needed.    . Omega-3 1000 MG CAPS Take by mouth.    Glory Rosebush DELICA LANCETS 87G MISC USE 1  TO CHECK GLUCOSE ONCE DAILY 100 each 5  . atorvastatin (LIPITOR) 40 MG tablet TAKE 1 TABLET BY MOUTH ONCE DAILY 90 tablet 3  . Carbidopa-Levodopa ER (SINEMET CR) 25-100 MG tablet controlled release Take 1 tablet by mouth 2 (two) times daily. 180 tablet 1  . metFORMIN (GLUCOPHAGE) 500 MG tablet Take 1 tablet (500 mg total) by mouth daily with breakfast. 1 tablet 0   No facility-administered medications prior to visit.    Allergies  Allergen Reactions  . Ropinirole Hcl Other (See Comments)    Left foot swelling.  Other reaction(s): Other Left foot swelling.     ROS Review of Systems    Objective:    Physical Exam  Constitutional: She is oriented to person, place, and time. She appears well-developed and well-nourished.  HENT:  Head: Normocephalic and atraumatic.  Cardiovascular: Normal rate, regular rhythm and normal heart sounds.  Pulmonary/Chest: Effort normal and breath sounds normal.  Musculoskeletal:     Comments: Pill rolling tremor in both hands.  Neurological: She is alert and oriented to person, place, and time.  Skin: Skin is warm  and dry.  Psychiatric: She has a normal mood and affect. Her behavior is normal.    BP 135/79   Pulse (!) 103   Ht _0  (1.626 m)   Wt 197 lb (89.4 kg)   SpO2 98%   BMI 33.81 kg/m  Wt Readings from Last 3 Encounters:  05/15/19 197 lb (89.4 kg)  01/03/19 198 lb (89.8 kg)  09/07/18 199 lb (90.3 kg)  There are no preventive care reminders to display for this patient.  There are no preventive care reminders to display for this patient.  Lab Results  Component Value Date   TSH 1.84 01/10/2018   Lab Results  Component Value Date   WBC 5.6 07/03/2012   HGB 14.9 02/02/2016   HCT 43.0 10/11/2013   MCV 90.1 07/03/2012   PLT 170 07/03/2012   Lab Results  Component Value Date   NA 142 05/09/2018   K 4.1 05/09/2018   CO2 23 05/09/2018   GLUCOSE 89 05/09/2018   BUN 19 05/09/2018   CREATININE 0.77 05/09/2018   BILITOT 0.5 01/10/2018   ALKPHOS 61 02/02/2016   AST 17 01/10/2018   ALT 13 01/10/2018   PROT 7.5 01/10/2018   ALBUMIN 4.4 02/02/2016   CALCIUM 9.7 05/09/2018   Lab Results  Component Value Date   CHOL 128 01/10/2018   Lab Results  Component Value Date   HDL 56 01/10/2018   Lab Results  Component Value Date   LDLCALC 53 01/10/2018   Lab Results  Component Value Date   TRIG 107 01/10/2018   Lab Results  Component Value Date   CHOLHDL 2.3 01/10/2018   Lab Results  Component Value Date   HGBA1C 5.7 (A) 05/15/2019      Assessment & Plan:   Problem List Items Addressed This Visit      Endocrine   Hyperlipidemia associated with type 2 diabetes mellitus ([place])   Relevant Medications   atorvastatin (LIPITOR) 40 MG tablet   metFORMIN (GLUCOPHAGE) 500 MG tablet   Other Relevant Orders   COMPLETE METABOLIC PANEL WITH GFR   Lipid panel   Controlled diabetes mellitus type 2 with complications (HCC) - Primary    A1c looks fantastic today at 5.7.  Improved from previous of 5.9.  Continue current regimen.  Follow-up in 4 months.      Relevant  Medications   atorvastatin (LIPITOR) 40 MG tablet   metFORMIN (GLUCOPHAGE) 500 MG tablet   Other Relevant Orders   POCT glycosylated hemoglobin (Hb A1C) (Completed)   COMPLETE METABOLIC PANEL WITH GFR   Lipid panel     Nervous and Auditory   Parkinson disease ([place])    Ahead and refill Sinemet CR for 6 months.  Depending on the tremor in her left hand if it seems to ramp up we can always adjust her dose if needed.      Relevant Medications   Carbidopa-Levodopa ER (SINEMET CR) 25-100 MG tablet controlled release     Other   Hyperlipidemia    Due to recheck lipid panel.  Continue atorvastatin.  Refill sent for the year.      Relevant Medications   atorvastatin (LIPITOR) 40 MG tablet   Other Relevant Orders   Lipid panel    Other Visit Diagnoses    Controlled type 2 diabetes mellitus without complication, without long-term current use of insulin (HCC)       Relevant Medications   atorvastatin (LIPITOR) 40 MG tablet   metFORMIN (GLUCOPHAGE) 500 MG tablet      Meds ordered this encounter  Medications  . atorvastatin (LIPITOR) 40 MG tablet    Sig: Take 1 tablet (40 mg total) by mouth daily.    Dispense:  90 tablet    Refill:  3  . metFORMIN (GLUCOPHAGE) 500 MG tablet    Sig: Take 1 tablet (500 mg total) by mouth daily with breakfast.    Dispense:  90 tablet  Refill:  3  . Carbidopa-Levodopa ER (SINEMET CR) 25-100 MG tablet controlled release    Sig: Take 1 tablet by mouth 2 (two) times daily.    Dispense:  180 tablet    Refill:  1    Ok to fill 06/28/2019    Follow-up: Return in about 4 months (around 09/14/2019) for Diabetes follow-up.    Beatrice Lecher, MD

## 2019-05-15 NOTE — Assessment & Plan Note (Signed)
Ahead and refill Sinemet CR for 6 months.  Depending on the tremor in her left hand if it seems to ramp up we can always adjust her dose if needed.

## 2019-05-15 NOTE — Assessment & Plan Note (Signed)
Due to recheck lipid panel.  Continue atorvastatin.  Refill sent for the year.

## 2019-05-16 LAB — LIPID PANEL
Cholesterol: 115 mg/dL (ref <200)
HDL: 50 mg/dL (ref >=50)
LDL Cholesterol (Calc): 49 mg/dL (calc)
Non-HDL Cholesterol (Calc): 65 mg/dL (calc) (ref <130)
Total CHOL/HDL Ratio: 2.3 (calc) (ref <5.0)
Triglycerides: 82 mg/dL (ref <150)

## 2019-05-16 LAB — COMPLETE METABOLIC PANEL WITH GFR
AG Ratio: 1.7 (calc) (ref 1.0–2.5)
ALT: 14 U/L (ref 6–29)
AST: 18 U/L (ref 10–35)
Albumin: 4.6 g/dL (ref 3.6–5.1)
Alkaline phosphatase (APISO): 68 U/L (ref 37–153)
BUN: 18 mg/dL (ref 7–25)
CO2: 28 mmol/L (ref 20–32)
Calcium: 9.8 mg/dL (ref 8.6–10.4)
Chloride: 104 mmol/L (ref 98–110)
Creat: 0.82 mg/dL (ref 0.60–0.93)
GFR, Est African American: 82 mL/min/{1.73_m2} (ref >=60)
GFR, Est Non African American: 71 mL/min/{1.73_m2} (ref >=60)
Globulin: 2.7 g/dL (calc) (ref 1.9–3.7)
Glucose, Bld: 99 mg/dL (ref 65–99)
Potassium: 4.1 mmol/L (ref 3.5–5.3)
Sodium: 141 mmol/L (ref 135–146)
Total Bilirubin: 0.6 mg/dL (ref 0.2–1.2)
Total Protein: 7.3 g/dL (ref 6.1–8.1)

## 2019-05-16 NOTE — Progress Notes (Signed)
All labs are normal. 

## 2019-05-18 ENCOUNTER — Other Ambulatory Visit: Payer: Self-pay | Admitting: Family Medicine

## 2019-05-18 DIAGNOSIS — E119 Type 2 diabetes mellitus without complications: Secondary | ICD-10-CM

## 2019-05-22 ENCOUNTER — Ambulatory Visit: Payer: Self-pay

## 2019-05-23 ENCOUNTER — Other Ambulatory Visit: Payer: Self-pay

## 2019-05-23 ENCOUNTER — Other Ambulatory Visit: Payer: Self-pay | Admitting: Family Medicine

## 2019-05-23 DIAGNOSIS — E119 Type 2 diabetes mellitus without complications: Secondary | ICD-10-CM

## 2019-05-23 MED ORDER — METFORMIN HCL 500 MG PO TABS: 500.0000 mg | ORAL_TABLET | Freq: Every day | ORAL | 1 refills | Status: DC

## 2019-09-06 DIAGNOSIS — K810 Acute cholecystitis: Secondary | ICD-10-CM | POA: Diagnosis not present

## 2019-09-06 DIAGNOSIS — K5790 Diverticulosis of intestine, part unspecified, without perforation or abscess without bleeding: Secondary | ICD-10-CM | POA: Diagnosis not present

## 2019-09-06 DIAGNOSIS — G20 Parkinson's disease: Secondary | ICD-10-CM | POA: Diagnosis not present

## 2019-09-06 DIAGNOSIS — K8000 Calculus of gallbladder with acute cholecystitis without obstruction: Secondary | ICD-10-CM | POA: Diagnosis not present

## 2019-09-06 DIAGNOSIS — Z8739 Personal history of other diseases of the musculoskeletal system and connective tissue: Secondary | ICD-10-CM | POA: Diagnosis not present

## 2019-09-06 DIAGNOSIS — I6782 Cerebral ischemia: Secondary | ICD-10-CM | POA: Diagnosis not present

## 2019-09-06 DIAGNOSIS — R110 Nausea: Secondary | ICD-10-CM | POA: Diagnosis not present

## 2019-09-06 DIAGNOSIS — M1990 Unspecified osteoarthritis, unspecified site: Secondary | ICD-10-CM | POA: Diagnosis not present

## 2019-09-06 DIAGNOSIS — K449 Diaphragmatic hernia without obstruction or gangrene: Secondary | ICD-10-CM | POA: Diagnosis not present

## 2019-09-06 DIAGNOSIS — I639 Cerebral infarction, unspecified: Secondary | ICD-10-CM | POA: Diagnosis not present

## 2019-09-06 DIAGNOSIS — K8020 Calculus of gallbladder without cholecystitis without obstruction: Secondary | ICD-10-CM | POA: Diagnosis not present

## 2019-09-06 DIAGNOSIS — J90 Pleural effusion, not elsewhere classified: Secondary | ICD-10-CM | POA: Diagnosis not present

## 2019-09-06 DIAGNOSIS — E119 Type 2 diabetes mellitus without complications: Secondary | ICD-10-CM | POA: Diagnosis not present

## 2019-09-06 DIAGNOSIS — R197 Diarrhea, unspecified: Secondary | ICD-10-CM | POA: Diagnosis not present

## 2019-09-06 DIAGNOSIS — Z7984 Long term (current) use of oral hypoglycemic drugs: Secondary | ICD-10-CM | POA: Diagnosis not present

## 2019-09-06 DIAGNOSIS — R112 Nausea with vomiting, unspecified: Secondary | ICD-10-CM | POA: Diagnosis not present

## 2019-09-06 DIAGNOSIS — R29810 Facial weakness: Secondary | ICD-10-CM | POA: Diagnosis not present

## 2019-09-06 DIAGNOSIS — Z743 Need for continuous supervision: Secondary | ICD-10-CM | POA: Diagnosis not present

## 2019-09-06 DIAGNOSIS — R0902 Hypoxemia: Secondary | ICD-10-CM | POA: Diagnosis not present

## 2019-09-06 DIAGNOSIS — I499 Cardiac arrhythmia, unspecified: Secondary | ICD-10-CM | POA: Diagnosis not present

## 2019-09-06 DIAGNOSIS — Z9049 Acquired absence of other specified parts of digestive tract: Secondary | ICD-10-CM | POA: Diagnosis not present

## 2019-09-06 DIAGNOSIS — G459 Transient cerebral ischemic attack, unspecified: Secondary | ICD-10-CM | POA: Diagnosis not present

## 2019-09-06 DIAGNOSIS — J9811 Atelectasis: Secondary | ICD-10-CM | POA: Diagnosis not present

## 2019-09-06 DIAGNOSIS — N200 Calculus of kidney: Secondary | ICD-10-CM | POA: Diagnosis not present

## 2019-09-06 DIAGNOSIS — K82A1 Gangrene of gallbladder in cholecystitis: Secondary | ICD-10-CM | POA: Diagnosis not present

## 2019-09-07 DIAGNOSIS — E119 Type 2 diabetes mellitus without complications: Secondary | ICD-10-CM | POA: Diagnosis not present

## 2019-09-07 DIAGNOSIS — M1990 Unspecified osteoarthritis, unspecified site: Secondary | ICD-10-CM | POA: Diagnosis not present

## 2019-09-07 DIAGNOSIS — Z7984 Long term (current) use of oral hypoglycemic drugs: Secondary | ICD-10-CM | POA: Diagnosis not present

## 2019-09-07 DIAGNOSIS — K8000 Calculus of gallbladder with acute cholecystitis without obstruction: Secondary | ICD-10-CM | POA: Diagnosis not present

## 2019-09-07 DIAGNOSIS — G20 Parkinson's disease: Secondary | ICD-10-CM | POA: Diagnosis not present

## 2019-09-07 DIAGNOSIS — K82A1 Gangrene of gallbladder in cholecystitis: Secondary | ICD-10-CM | POA: Diagnosis not present

## 2019-09-07 DIAGNOSIS — R197 Diarrhea, unspecified: Secondary | ICD-10-CM | POA: Diagnosis not present

## 2019-09-09 DIAGNOSIS — R197 Diarrhea, unspecified: Secondary | ICD-10-CM | POA: Diagnosis not present

## 2019-09-09 DIAGNOSIS — K8000 Calculus of gallbladder with acute cholecystitis without obstruction: Secondary | ICD-10-CM | POA: Diagnosis not present

## 2019-09-09 DIAGNOSIS — K82A1 Gangrene of gallbladder in cholecystitis: Secondary | ICD-10-CM | POA: Diagnosis not present

## 2019-09-09 DIAGNOSIS — E119 Type 2 diabetes mellitus without complications: Secondary | ICD-10-CM | POA: Diagnosis not present

## 2019-09-09 DIAGNOSIS — M1990 Unspecified osteoarthritis, unspecified site: Secondary | ICD-10-CM | POA: Diagnosis not present

## 2019-09-09 DIAGNOSIS — G20 Parkinson's disease: Secondary | ICD-10-CM | POA: Diagnosis not present

## 2019-09-09 DIAGNOSIS — Z7984 Long term (current) use of oral hypoglycemic drugs: Secondary | ICD-10-CM | POA: Diagnosis not present

## 2019-09-10 DIAGNOSIS — K82A1 Gangrene of gallbladder in cholecystitis: Secondary | ICD-10-CM | POA: Diagnosis not present

## 2019-09-10 DIAGNOSIS — K8000 Calculus of gallbladder with acute cholecystitis without obstruction: Secondary | ICD-10-CM | POA: Diagnosis not present

## 2019-09-10 DIAGNOSIS — R197 Diarrhea, unspecified: Secondary | ICD-10-CM | POA: Diagnosis not present

## 2019-09-10 DIAGNOSIS — M1990 Unspecified osteoarthritis, unspecified site: Secondary | ICD-10-CM | POA: Diagnosis not present

## 2019-09-10 DIAGNOSIS — Z7984 Long term (current) use of oral hypoglycemic drugs: Secondary | ICD-10-CM | POA: Diagnosis not present

## 2019-09-10 DIAGNOSIS — G20 Parkinson's disease: Secondary | ICD-10-CM | POA: Diagnosis not present

## 2019-09-10 DIAGNOSIS — E119 Type 2 diabetes mellitus without complications: Secondary | ICD-10-CM | POA: Diagnosis not present

## 2019-09-11 DIAGNOSIS — Z7984 Long term (current) use of oral hypoglycemic drugs: Secondary | ICD-10-CM | POA: Diagnosis not present

## 2019-09-11 DIAGNOSIS — G20 Parkinson's disease: Secondary | ICD-10-CM | POA: Diagnosis not present

## 2019-09-11 DIAGNOSIS — M1990 Unspecified osteoarthritis, unspecified site: Secondary | ICD-10-CM | POA: Diagnosis not present

## 2019-09-11 DIAGNOSIS — R197 Diarrhea, unspecified: Secondary | ICD-10-CM | POA: Diagnosis not present

## 2019-09-11 DIAGNOSIS — K82A1 Gangrene of gallbladder in cholecystitis: Secondary | ICD-10-CM | POA: Diagnosis not present

## 2019-09-11 DIAGNOSIS — K8000 Calculus of gallbladder with acute cholecystitis without obstruction: Secondary | ICD-10-CM | POA: Diagnosis not present

## 2019-09-11 DIAGNOSIS — E119 Type 2 diabetes mellitus without complications: Secondary | ICD-10-CM | POA: Diagnosis not present

## 2019-09-12 DIAGNOSIS — E119 Type 2 diabetes mellitus without complications: Secondary | ICD-10-CM | POA: Diagnosis not present

## 2019-09-12 DIAGNOSIS — K82A1 Gangrene of gallbladder in cholecystitis: Secondary | ICD-10-CM | POA: Diagnosis not present

## 2019-09-12 DIAGNOSIS — R197 Diarrhea, unspecified: Secondary | ICD-10-CM | POA: Diagnosis not present

## 2019-09-12 DIAGNOSIS — Z7984 Long term (current) use of oral hypoglycemic drugs: Secondary | ICD-10-CM | POA: Diagnosis not present

## 2019-09-12 DIAGNOSIS — G20 Parkinson's disease: Secondary | ICD-10-CM | POA: Diagnosis not present

## 2019-09-12 DIAGNOSIS — K8000 Calculus of gallbladder with acute cholecystitis without obstruction: Secondary | ICD-10-CM | POA: Diagnosis not present

## 2019-09-12 DIAGNOSIS — M1990 Unspecified osteoarthritis, unspecified site: Secondary | ICD-10-CM | POA: Diagnosis not present

## 2019-09-13 DIAGNOSIS — E119 Type 2 diabetes mellitus without complications: Secondary | ICD-10-CM | POA: Diagnosis not present

## 2019-09-13 DIAGNOSIS — Z7984 Long term (current) use of oral hypoglycemic drugs: Secondary | ICD-10-CM | POA: Diagnosis not present

## 2019-09-13 DIAGNOSIS — K8000 Calculus of gallbladder with acute cholecystitis without obstruction: Secondary | ICD-10-CM | POA: Diagnosis not present

## 2019-09-13 DIAGNOSIS — M1990 Unspecified osteoarthritis, unspecified site: Secondary | ICD-10-CM | POA: Diagnosis not present

## 2019-09-13 DIAGNOSIS — R197 Diarrhea, unspecified: Secondary | ICD-10-CM | POA: Diagnosis not present

## 2019-09-13 DIAGNOSIS — K82A1 Gangrene of gallbladder in cholecystitis: Secondary | ICD-10-CM | POA: Diagnosis not present

## 2019-09-13 DIAGNOSIS — Z9049 Acquired absence of other specified parts of digestive tract: Secondary | ICD-10-CM | POA: Diagnosis not present

## 2019-09-13 DIAGNOSIS — G20 Parkinson's disease: Secondary | ICD-10-CM | POA: Diagnosis not present

## 2019-09-18 ENCOUNTER — Ambulatory Visit: Payer: Medicare Other | Admitting: Family Medicine

## 2019-09-18 DIAGNOSIS — Z87440 Personal history of urinary (tract) infections: Secondary | ICD-10-CM | POA: Diagnosis not present

## 2019-09-18 DIAGNOSIS — I10 Essential (primary) hypertension: Secondary | ICD-10-CM | POA: Diagnosis not present

## 2019-09-18 DIAGNOSIS — Z7984 Long term (current) use of oral hypoglycemic drugs: Secondary | ICD-10-CM | POA: Diagnosis not present

## 2019-09-18 DIAGNOSIS — Z993 Dependence on wheelchair: Secondary | ICD-10-CM | POA: Diagnosis not present

## 2019-09-18 DIAGNOSIS — H25813 Combined forms of age-related cataract, bilateral: Secondary | ICD-10-CM | POA: Diagnosis not present

## 2019-09-18 DIAGNOSIS — G459 Transient cerebral ischemic attack, unspecified: Secondary | ICD-10-CM | POA: Diagnosis not present

## 2019-09-18 DIAGNOSIS — G20 Parkinson's disease: Secondary | ICD-10-CM | POA: Diagnosis not present

## 2019-09-18 DIAGNOSIS — Z9049 Acquired absence of other specified parts of digestive tract: Secondary | ICD-10-CM | POA: Diagnosis not present

## 2019-09-18 DIAGNOSIS — E119 Type 2 diabetes mellitus without complications: Secondary | ICD-10-CM | POA: Diagnosis not present

## 2019-09-18 DIAGNOSIS — M170 Bilateral primary osteoarthritis of knee: Secondary | ICD-10-CM | POA: Diagnosis not present

## 2019-09-18 DIAGNOSIS — Z48815 Encounter for surgical aftercare following surgery on the digestive system: Secondary | ICD-10-CM | POA: Diagnosis not present

## 2019-09-18 DIAGNOSIS — G4700 Insomnia, unspecified: Secondary | ICD-10-CM | POA: Diagnosis not present

## 2019-09-18 DIAGNOSIS — Z9181 History of falling: Secondary | ICD-10-CM | POA: Diagnosis not present

## 2019-09-20 DIAGNOSIS — Z9181 History of falling: Secondary | ICD-10-CM | POA: Diagnosis not present

## 2019-09-20 DIAGNOSIS — H25813 Combined forms of age-related cataract, bilateral: Secondary | ICD-10-CM | POA: Diagnosis not present

## 2019-09-20 DIAGNOSIS — I10 Essential (primary) hypertension: Secondary | ICD-10-CM | POA: Diagnosis not present

## 2019-09-20 DIAGNOSIS — Z48815 Encounter for surgical aftercare following surgery on the digestive system: Secondary | ICD-10-CM | POA: Diagnosis not present

## 2019-09-20 DIAGNOSIS — E119 Type 2 diabetes mellitus without complications: Secondary | ICD-10-CM | POA: Diagnosis not present

## 2019-09-20 DIAGNOSIS — Z87440 Personal history of urinary (tract) infections: Secondary | ICD-10-CM | POA: Diagnosis not present

## 2019-09-20 DIAGNOSIS — G20 Parkinson's disease: Secondary | ICD-10-CM | POA: Diagnosis not present

## 2019-09-20 DIAGNOSIS — M170 Bilateral primary osteoarthritis of knee: Secondary | ICD-10-CM | POA: Diagnosis not present

## 2019-09-20 DIAGNOSIS — G4700 Insomnia, unspecified: Secondary | ICD-10-CM | POA: Diagnosis not present

## 2019-09-20 DIAGNOSIS — Z9049 Acquired absence of other specified parts of digestive tract: Secondary | ICD-10-CM | POA: Diagnosis not present

## 2019-09-20 DIAGNOSIS — G459 Transient cerebral ischemic attack, unspecified: Secondary | ICD-10-CM | POA: Diagnosis not present

## 2019-09-20 DIAGNOSIS — Z993 Dependence on wheelchair: Secondary | ICD-10-CM | POA: Diagnosis not present

## 2019-09-20 DIAGNOSIS — Z7984 Long term (current) use of oral hypoglycemic drugs: Secondary | ICD-10-CM | POA: Diagnosis not present

## 2019-09-24 ENCOUNTER — Encounter: Payer: Self-pay | Admitting: Family Medicine

## 2019-09-24 ENCOUNTER — Ambulatory Visit (INDEPENDENT_AMBULATORY_CARE_PROVIDER_SITE_OTHER): Payer: Medicare Other | Admitting: Family Medicine

## 2019-09-24 VITALS — BP 119/84 | HR 103 | Ht 64.0 in | Wt 187.0 lb

## 2019-09-24 DIAGNOSIS — G20 Parkinson's disease: Secondary | ICD-10-CM | POA: Diagnosis not present

## 2019-09-24 DIAGNOSIS — Z9049 Acquired absence of other specified parts of digestive tract: Secondary | ICD-10-CM

## 2019-09-24 DIAGNOSIS — E118 Type 2 diabetes mellitus with unspecified complications: Secondary | ICD-10-CM | POA: Diagnosis not present

## 2019-09-24 LAB — POCT GLYCOSYLATED HEMOGLOBIN (HGB A1C): Hemoglobin A1C: 6 % — AB (ref 4.0–5.6)

## 2019-09-24 NOTE — Assessment & Plan Note (Signed)
Doing well overall and is happy with her regimen but is interested in potentially adjusting her regimen in 3 months when she completes her 90-day supply.

## 2019-09-24 NOTE — Assessment & Plan Note (Signed)
A1c is up just a little bit from previous at 6.0 versus 5.7.  Still a great number.  Continue current regimen and follow-up in 4 months.  Continue to work on [employer].  She is actually lost about 10 pounds since having her gallbladder removed.

## 2019-09-24 NOTE — Progress Notes (Signed)
Established Patient Office Visit  Subjective:  Patient ID: Paige Moore, female    DOB: 1945-10-12  Age: 74 y.o. MRN: [id number]  CC:  Chief Complaint  Patient presents with  . Hospitalization Follow-up    HPI  Paige Moore presents for hospital follow-up for acute cholecystitis status post cholecystectomy.  She was admit admitted to [facility] on June 24.  Surgery was performed by Dr. Alinda Dooms, [facility]. she is back home now and doing well she still has little bit of soreness of the incisions but feels like she is eating better.  There her appetite still down a little bit she denies any fevers, chills or sweats.  She says her bowel movements have been regular she has not had any problems.  She is getting home health coming in doing some physical therapy about twice a week.  Hospital notes reviewed.  Diabetes - no hypoglycemic events. No wounds or sores that are not healing well. No increased thirst or urination. Checking glucose at home. Taking medications as prescribed without any side effects.   Follow-up Parkinson's-she is currently on carbidopa levodopa and doing well she is happy with her regimen overall.  She denies wearing off of the medication during the daytime.  She is currently on 25/100.   Past Medical History:  Diagnosis Date  . Essential tremor   . Hyperlipidemia   . OA (osteoarthritis)   . PONV (postoperative nausea and vomiting)   . Prediabetes   . VIN III (vulvar intraepithelial neoplasia III)   . Wears dentures    upper  . Wears glasses     Past Surgical History:  Procedure Laterality Date  . CESAREAN SECTION     X 2  . Corneal topography  10/09/2018  . KNEE ARTHROSCOPY Left   . ORIF RIGHT ANKLE FX  1987   later hardware removed  . PLANTAR FASCIA RELEASE  12-02-2003   RIGHT HEEL  . TOTAL ABDOMINAL HYSTERECTOMY  age 76  . TOTAL HIP ARTHROPLASTY Left 7/ 2011    [facility]  . TOTAL KNEE ARTHROPLASTY Left 07-11-2010   [facility]  . VULVECTOMY Left 10/11/2013    Procedure: WIDE LOCAL EXCISON OF LEFT VULVA;  Surgeon: Everitt Amber, MD;  Location: [facility];  Service: Gynecology;  Laterality: Left;  . WRIST GANGLION EXCISION Left 2001    Family History  Problem Relation Age of Onset  . Diabetes Father   . Cancer Sister        endometrial cancer  . Osteoporosis Mother   . Hypothyroidism Mother     Social History   Socioeconomic History  . Marital status: Widowed    Spouse name: Not on file  . Number of children: 2  . Years of education: 8  . Highest education level: 8th grade  Occupational History  . Occupation: [employer]    Comment: retired  Tobacco Use  . Smoking status: Former Smoker    Packs/day: 0.25    Years: 15.00    Pack years: 3.75    Types: Cigarettes    Quit date: 03/15/2006    Years since quitting: 13.5  . Smokeless tobacco: Never Used  Vaping Use  . Vaping Use: Never used  Substance and Sexual Activity  . Alcohol use: No  . Drug use: No  . Sexual activity: Not Currently  Other Topics Concern  . Not on file  Social History Narrative   Retired from [employer] vs[employer]ia and stays nat home,.Walks. Likes to walk in  her neighborhood. Drinks a lot of water daiily   Social Determinants of Health   Financial Resource Strain:   . Difficulty of Paying Living Expenses:   Food Insecurity:   . Worried About Charity fundraiser in the Last Year:   . Arboriculturist in the Last Year:   Transportation Needs:   . Film/video editor (Medical):   Marland Kitchen Lack of Transportation (Non-Medical):   Physical Activity:   . Days of Exercise per Week:   . Minutes of Exercise per Session:   Stress:   . Feeling of Stress :   Social Connections:   . Frequency of Communication with Friends and Family:   . Frequency of Social Gatherings with Friends and Family:   . Attends Religious Services:   . Active Member of Clubs or Organizations:   . Attends Archivist Meetings:   Marland Kitchen Marital Status:   Intimate Partner  Violence:   . Fear of Current or Ex-Partner:   . Emotionally Abused:   Marland Kitchen Physically Abused:   . Sexually Abused:     Outpatient Medications Prior to Visit  Medication Sig Dispense Refill  . acetaminophen (TYLENOL) 500 MG tablet Take by mouth. 1-2 tabs po tid prn     . atorvastatin (LIPITOR) 40 MG tablet Take 1 tablet (40 mg total) by mouth daily. 90 tablet 3  . Blood Glucose Monitoring Suppl (ONE TOUCH ULTRA SYSTEM KIT) W/DEVICE KIT 1 kit by Does not apply route once. 1 each 0  . Carbidopa-Levodopa ER (SINEMET CR) 25-100 MG tablet controlled release Take 1 tablet by mouth 2 (two) times daily. 180 tablet 1  . docusate sodium (COLACE) 100 MG capsule Take 1 capsule (100 mg total) by mouth 2 (two) times daily. 20 capsule 0  . glucose blood (ONE TOUCH ULTRA TEST) test strip USE 1 STRIP TO CHECK GLUCOSE ONCE DAILY 100 each 5  . ibuprofen (ADVIL,MOTRIN) 200 MG tablet Take 200 mg by mouth every 6 (six) hours as needed.    . metFORMIN (GLUCOPHAGE) 500 MG tablet Take 1 tablet (500 mg total) by mouth daily with breakfast. 90 tablet 1  . Omega-3 1000 MG CAPS Take by mouth.    Glory Rosebush DELICA LANCETS 85U MISC USE 1  TO CHECK GLUCOSE ONCE DAILY 100 each 5   No facility-administered medications prior to visit.    Allergies  Allergen Reactions  . Ropinirole Hcl Other (See Comments)    Left foot swelling.  Other reaction(s): Other Left foot swelling.     ROS Review of Systems    Objective:    Physical Exam Constitutional:      Appearance: She is well-developed.  HENT:     Head: Normocephalic and atraumatic.  Cardiovascular:     Rate and Rhythm: Normal rate and regular rhythm.     Heart sounds: Normal heart sounds.  Pulmonary:     Effort: Pulmonary effort is normal.     Breath sounds: Normal breath sounds.  Skin:    General: Skin is warm and dry.  Neurological:     Mental Status: She is alert and oriented to person, place, and time.     Comments: Pill-rolling tremor of the left  and right hand.  Psychiatric:        Behavior: Behavior normal.     BP 119/84   Pulse (!) 103   Ht '5\' 4"'  (1.626 m)   Wt 187 lb (84.8 kg)   SpO2 96%   BMI 32.10  kg/m  Wt Readings from Last 3 Encounters:  09/24/19 187 lb (84.8 kg)  05/15/19 197 lb (89.4 kg)  01/03/19 198 lb (89.8 kg)     There are no preventive care reminders to display for this patient.  There are no preventive care reminders to display for this patient.  Lab Results  Component Value Date   TSH 1.84 01/10/2018   Lab Results  Component Value Date   WBC 5.6 07/03/2012   HGB 14.9 02/02/2016   HCT 43.0 10/11/2013   MCV 90.1 07/03/2012   PLT 170 07/03/2012   Lab Results  Component Value Date   NA 141 05/15/2019   K 4.1 05/15/2019   CO2 28 05/15/2019   GLUCOSE 99 05/15/2019   BUN 18 05/15/2019   CREATININE 0.82 05/15/2019   BILITOT 0.6 05/15/2019   ALKPHOS 61 02/02/2016   AST 18 05/15/2019   ALT 14 05/15/2019   PROT 7.3 05/15/2019   ALBUMIN 4.4 02/02/2016   CALCIUM 9.8 05/15/2019   Lab Results  Component Value Date   CHOL 115 05/15/2019   Lab Results  Component Value Date   HDL 50 05/15/2019   Lab Results  Component Value Date   LDLCALC 49 05/15/2019   Lab Results  Component Value Date   TRIG 82 05/15/2019   Lab Results  Component Value Date   CHOLHDL 2.3 05/15/2019   Lab Results  Component Value Date   HGBA1C 6.0 (A) 09/24/2019      Assessment & Plan:   Problem List Items Addressed This Visit      Endocrine   Controlled diabetes mellitus type 2 with complications ([place]) - Primary    A1c is up just a little bit from previous at 6.0 versus 5.7.  Still a great number.  Continue current regimen and follow-up in 4 months.  Continue to work on [employer].  She is actually lost about 10 pounds since having her gallbladder removed.      Relevant Orders   POCT glycosylated hemoglobin (Hb A1C) (Completed)     Nervous and Auditory   Parkinson disease ([place])    Doing well  overall and is happy with her regimen but is interested in potentially adjusting her regimen in 3 months when she completes her 90-day supply.        Other   S/P cholecystectomy     Status post cholecystectomy-she is actually doing really well she still has a little bit of soreness.  And a little bit of decreased appetite but feels like she is getting her strength back.  No orders of the defined types were placed in this encounter.   Follow-up: Return in about 4 months (around 01/25/2020) for Diabetes follow-up.   Time spent 30 minutes including reviewing encounter from the hospital.  Beatrice Lecher, MD

## 2019-09-25 DIAGNOSIS — H25813 Combined forms of age-related cataract, bilateral: Secondary | ICD-10-CM | POA: Diagnosis not present

## 2019-09-25 DIAGNOSIS — G459 Transient cerebral ischemic attack, unspecified: Secondary | ICD-10-CM | POA: Diagnosis not present

## 2019-09-25 DIAGNOSIS — Z993 Dependence on wheelchair: Secondary | ICD-10-CM | POA: Diagnosis not present

## 2019-09-25 DIAGNOSIS — Z87440 Personal history of urinary (tract) infections: Secondary | ICD-10-CM | POA: Diagnosis not present

## 2019-09-25 DIAGNOSIS — I10 Essential (primary) hypertension: Secondary | ICD-10-CM | POA: Diagnosis not present

## 2019-09-25 DIAGNOSIS — Z48815 Encounter for surgical aftercare following surgery on the digestive system: Secondary | ICD-10-CM | POA: Diagnosis not present

## 2019-09-25 DIAGNOSIS — E119 Type 2 diabetes mellitus without complications: Secondary | ICD-10-CM | POA: Diagnosis not present

## 2019-09-25 DIAGNOSIS — G4700 Insomnia, unspecified: Secondary | ICD-10-CM | POA: Diagnosis not present

## 2019-09-25 DIAGNOSIS — Z9181 History of falling: Secondary | ICD-10-CM | POA: Diagnosis not present

## 2019-09-25 DIAGNOSIS — Z7984 Long term (current) use of oral hypoglycemic drugs: Secondary | ICD-10-CM | POA: Diagnosis not present

## 2019-09-25 DIAGNOSIS — G20 Parkinson's disease: Secondary | ICD-10-CM | POA: Diagnosis not present

## 2019-09-25 DIAGNOSIS — Z9049 Acquired absence of other specified parts of digestive tract: Secondary | ICD-10-CM | POA: Diagnosis not present

## 2019-09-25 DIAGNOSIS — M170 Bilateral primary osteoarthritis of knee: Secondary | ICD-10-CM | POA: Diagnosis not present

## 2019-09-27 DIAGNOSIS — M170 Bilateral primary osteoarthritis of knee: Secondary | ICD-10-CM | POA: Diagnosis not present

## 2019-09-27 DIAGNOSIS — Z87440 Personal history of urinary (tract) infections: Secondary | ICD-10-CM | POA: Diagnosis not present

## 2019-09-27 DIAGNOSIS — G4700 Insomnia, unspecified: Secondary | ICD-10-CM | POA: Diagnosis not present

## 2019-09-27 DIAGNOSIS — G20 Parkinson's disease: Secondary | ICD-10-CM | POA: Diagnosis not present

## 2019-09-27 DIAGNOSIS — Z9049 Acquired absence of other specified parts of digestive tract: Secondary | ICD-10-CM | POA: Diagnosis not present

## 2019-09-27 DIAGNOSIS — Z9181 History of falling: Secondary | ICD-10-CM | POA: Diagnosis not present

## 2019-09-27 DIAGNOSIS — G459 Transient cerebral ischemic attack, unspecified: Secondary | ICD-10-CM | POA: Diagnosis not present

## 2019-09-27 DIAGNOSIS — Z7984 Long term (current) use of oral hypoglycemic drugs: Secondary | ICD-10-CM | POA: Diagnosis not present

## 2019-09-27 DIAGNOSIS — Z993 Dependence on wheelchair: Secondary | ICD-10-CM | POA: Diagnosis not present

## 2019-09-27 DIAGNOSIS — E119 Type 2 diabetes mellitus without complications: Secondary | ICD-10-CM | POA: Diagnosis not present

## 2019-09-27 DIAGNOSIS — H25813 Combined forms of age-related cataract, bilateral: Secondary | ICD-10-CM | POA: Diagnosis not present

## 2019-09-27 DIAGNOSIS — I10 Essential (primary) hypertension: Secondary | ICD-10-CM | POA: Diagnosis not present

## 2019-09-27 DIAGNOSIS — Z48815 Encounter for surgical aftercare following surgery on the digestive system: Secondary | ICD-10-CM | POA: Diagnosis not present

## 2019-10-04 ENCOUNTER — Ambulatory Visit: Payer: Medicare Other | Admitting: Family Medicine

## 2019-10-09 DIAGNOSIS — M170 Bilateral primary osteoarthritis of knee: Secondary | ICD-10-CM | POA: Diagnosis not present

## 2019-10-09 DIAGNOSIS — G20 Parkinson's disease: Secondary | ICD-10-CM | POA: Diagnosis not present

## 2019-10-09 DIAGNOSIS — Z87440 Personal history of urinary (tract) infections: Secondary | ICD-10-CM | POA: Diagnosis not present

## 2019-10-09 DIAGNOSIS — H25813 Combined forms of age-related cataract, bilateral: Secondary | ICD-10-CM | POA: Diagnosis not present

## 2019-10-09 DIAGNOSIS — I10 Essential (primary) hypertension: Secondary | ICD-10-CM | POA: Diagnosis not present

## 2019-10-09 DIAGNOSIS — E119 Type 2 diabetes mellitus without complications: Secondary | ICD-10-CM | POA: Diagnosis not present

## 2019-10-09 DIAGNOSIS — Z9181 History of falling: Secondary | ICD-10-CM | POA: Diagnosis not present

## 2019-10-09 DIAGNOSIS — Z993 Dependence on wheelchair: Secondary | ICD-10-CM | POA: Diagnosis not present

## 2019-10-09 DIAGNOSIS — G4700 Insomnia, unspecified: Secondary | ICD-10-CM | POA: Diagnosis not present

## 2019-10-09 DIAGNOSIS — Z9049 Acquired absence of other specified parts of digestive tract: Secondary | ICD-10-CM | POA: Diagnosis not present

## 2019-10-09 DIAGNOSIS — Z48815 Encounter for surgical aftercare following surgery on the digestive system: Secondary | ICD-10-CM | POA: Diagnosis not present

## 2019-10-09 DIAGNOSIS — G459 Transient cerebral ischemic attack, unspecified: Secondary | ICD-10-CM | POA: Diagnosis not present

## 2019-10-09 DIAGNOSIS — Z7984 Long term (current) use of oral hypoglycemic drugs: Secondary | ICD-10-CM | POA: Diagnosis not present

## 2019-10-14 DIAGNOSIS — Z9049 Acquired absence of other specified parts of digestive tract: Secondary | ICD-10-CM | POA: Diagnosis not present

## 2019-10-16 DIAGNOSIS — Z87440 Personal history of urinary (tract) infections: Secondary | ICD-10-CM | POA: Diagnosis not present

## 2019-10-16 DIAGNOSIS — I10 Essential (primary) hypertension: Secondary | ICD-10-CM | POA: Diagnosis not present

## 2019-10-16 DIAGNOSIS — Z993 Dependence on wheelchair: Secondary | ICD-10-CM | POA: Diagnosis not present

## 2019-10-16 DIAGNOSIS — G20 Parkinson's disease: Secondary | ICD-10-CM | POA: Diagnosis not present

## 2019-10-16 DIAGNOSIS — Z7984 Long term (current) use of oral hypoglycemic drugs: Secondary | ICD-10-CM | POA: Diagnosis not present

## 2019-10-16 DIAGNOSIS — G4700 Insomnia, unspecified: Secondary | ICD-10-CM | POA: Diagnosis not present

## 2019-10-16 DIAGNOSIS — M170 Bilateral primary osteoarthritis of knee: Secondary | ICD-10-CM | POA: Diagnosis not present

## 2019-10-16 DIAGNOSIS — Z9181 History of falling: Secondary | ICD-10-CM | POA: Diagnosis not present

## 2019-10-16 DIAGNOSIS — Z48815 Encounter for surgical aftercare following surgery on the digestive system: Secondary | ICD-10-CM | POA: Diagnosis not present

## 2019-10-16 DIAGNOSIS — G459 Transient cerebral ischemic attack, unspecified: Secondary | ICD-10-CM | POA: Diagnosis not present

## 2019-10-16 DIAGNOSIS — Z9049 Acquired absence of other specified parts of digestive tract: Secondary | ICD-10-CM | POA: Diagnosis not present

## 2019-10-16 DIAGNOSIS — E119 Type 2 diabetes mellitus without complications: Secondary | ICD-10-CM | POA: Diagnosis not present

## 2019-10-16 DIAGNOSIS — H25813 Combined forms of age-related cataract, bilateral: Secondary | ICD-10-CM | POA: Diagnosis not present

## 2019-10-23 DIAGNOSIS — Z87440 Personal history of urinary (tract) infections: Secondary | ICD-10-CM | POA: Diagnosis not present

## 2019-10-23 DIAGNOSIS — Z7984 Long term (current) use of oral hypoglycemic drugs: Secondary | ICD-10-CM | POA: Diagnosis not present

## 2019-10-23 DIAGNOSIS — M170 Bilateral primary osteoarthritis of knee: Secondary | ICD-10-CM | POA: Diagnosis not present

## 2019-10-23 DIAGNOSIS — E119 Type 2 diabetes mellitus without complications: Secondary | ICD-10-CM | POA: Diagnosis not present

## 2019-10-23 DIAGNOSIS — Z9049 Acquired absence of other specified parts of digestive tract: Secondary | ICD-10-CM | POA: Diagnosis not present

## 2019-10-23 DIAGNOSIS — H25813 Combined forms of age-related cataract, bilateral: Secondary | ICD-10-CM | POA: Diagnosis not present

## 2019-10-23 DIAGNOSIS — G4700 Insomnia, unspecified: Secondary | ICD-10-CM | POA: Diagnosis not present

## 2019-10-23 DIAGNOSIS — Z993 Dependence on wheelchair: Secondary | ICD-10-CM | POA: Diagnosis not present

## 2019-10-23 DIAGNOSIS — G459 Transient cerebral ischemic attack, unspecified: Secondary | ICD-10-CM | POA: Diagnosis not present

## 2019-10-23 DIAGNOSIS — I10 Essential (primary) hypertension: Secondary | ICD-10-CM | POA: Diagnosis not present

## 2019-10-23 DIAGNOSIS — Z9181 History of falling: Secondary | ICD-10-CM | POA: Diagnosis not present

## 2019-10-23 DIAGNOSIS — G20 Parkinson's disease: Secondary | ICD-10-CM | POA: Diagnosis not present

## 2019-10-23 DIAGNOSIS — Z48815 Encounter for surgical aftercare following surgery on the digestive system: Secondary | ICD-10-CM | POA: Diagnosis not present

## 2019-10-25 DIAGNOSIS — K8000 Calculus of gallbladder with acute cholecystitis without obstruction: Secondary | ICD-10-CM | POA: Diagnosis not present

## 2019-10-30 DIAGNOSIS — Z48815 Encounter for surgical aftercare following surgery on the digestive system: Secondary | ICD-10-CM | POA: Diagnosis not present

## 2019-10-30 DIAGNOSIS — I10 Essential (primary) hypertension: Secondary | ICD-10-CM | POA: Diagnosis not present

## 2019-10-30 DIAGNOSIS — M170 Bilateral primary osteoarthritis of knee: Secondary | ICD-10-CM | POA: Diagnosis not present

## 2019-10-30 DIAGNOSIS — Z9049 Acquired absence of other specified parts of digestive tract: Secondary | ICD-10-CM | POA: Diagnosis not present

## 2019-10-30 DIAGNOSIS — E119 Type 2 diabetes mellitus without complications: Secondary | ICD-10-CM | POA: Diagnosis not present

## 2019-10-30 DIAGNOSIS — Z7984 Long term (current) use of oral hypoglycemic drugs: Secondary | ICD-10-CM | POA: Diagnosis not present

## 2019-10-30 DIAGNOSIS — G4700 Insomnia, unspecified: Secondary | ICD-10-CM | POA: Diagnosis not present

## 2019-10-30 DIAGNOSIS — Z9181 History of falling: Secondary | ICD-10-CM | POA: Diagnosis not present

## 2019-10-30 DIAGNOSIS — G20 Parkinson's disease: Secondary | ICD-10-CM | POA: Diagnosis not present

## 2019-10-30 DIAGNOSIS — Z87440 Personal history of urinary (tract) infections: Secondary | ICD-10-CM | POA: Diagnosis not present

## 2019-10-30 DIAGNOSIS — G459 Transient cerebral ischemic attack, unspecified: Secondary | ICD-10-CM | POA: Diagnosis not present

## 2019-10-30 DIAGNOSIS — Z993 Dependence on wheelchair: Secondary | ICD-10-CM | POA: Diagnosis not present

## 2019-10-30 DIAGNOSIS — H25813 Combined forms of age-related cataract, bilateral: Secondary | ICD-10-CM | POA: Diagnosis not present

## 2019-11-06 DIAGNOSIS — Z7984 Long term (current) use of oral hypoglycemic drugs: Secondary | ICD-10-CM | POA: Diagnosis not present

## 2019-11-06 DIAGNOSIS — G459 Transient cerebral ischemic attack, unspecified: Secondary | ICD-10-CM | POA: Diagnosis not present

## 2019-11-06 DIAGNOSIS — E119 Type 2 diabetes mellitus without complications: Secondary | ICD-10-CM | POA: Diagnosis not present

## 2019-11-06 DIAGNOSIS — I10 Essential (primary) hypertension: Secondary | ICD-10-CM | POA: Diagnosis not present

## 2019-11-06 DIAGNOSIS — Z9049 Acquired absence of other specified parts of digestive tract: Secondary | ICD-10-CM | POA: Diagnosis not present

## 2019-11-06 DIAGNOSIS — Z48815 Encounter for surgical aftercare following surgery on the digestive system: Secondary | ICD-10-CM | POA: Diagnosis not present

## 2019-11-06 DIAGNOSIS — Z993 Dependence on wheelchair: Secondary | ICD-10-CM | POA: Diagnosis not present

## 2019-11-06 DIAGNOSIS — H25813 Combined forms of age-related cataract, bilateral: Secondary | ICD-10-CM | POA: Diagnosis not present

## 2019-11-06 DIAGNOSIS — Z87440 Personal history of urinary (tract) infections: Secondary | ICD-10-CM | POA: Diagnosis not present

## 2019-11-06 DIAGNOSIS — Z9181 History of falling: Secondary | ICD-10-CM | POA: Diagnosis not present

## 2019-11-06 DIAGNOSIS — M170 Bilateral primary osteoarthritis of knee: Secondary | ICD-10-CM | POA: Diagnosis not present

## 2019-11-06 DIAGNOSIS — G4700 Insomnia, unspecified: Secondary | ICD-10-CM | POA: Diagnosis not present

## 2019-11-06 DIAGNOSIS — G20 Parkinson's disease: Secondary | ICD-10-CM | POA: Diagnosis not present

## 2019-11-12 DIAGNOSIS — I10 Essential (primary) hypertension: Secondary | ICD-10-CM | POA: Diagnosis not present

## 2019-11-12 DIAGNOSIS — H25813 Combined forms of age-related cataract, bilateral: Secondary | ICD-10-CM | POA: Diagnosis not present

## 2019-11-12 DIAGNOSIS — Z87440 Personal history of urinary (tract) infections: Secondary | ICD-10-CM | POA: Diagnosis not present

## 2019-11-12 DIAGNOSIS — G459 Transient cerebral ischemic attack, unspecified: Secondary | ICD-10-CM | POA: Diagnosis not present

## 2019-11-12 DIAGNOSIS — M170 Bilateral primary osteoarthritis of knee: Secondary | ICD-10-CM | POA: Diagnosis not present

## 2019-11-12 DIAGNOSIS — G4700 Insomnia, unspecified: Secondary | ICD-10-CM | POA: Diagnosis not present

## 2019-11-12 DIAGNOSIS — Z9181 History of falling: Secondary | ICD-10-CM | POA: Diagnosis not present

## 2019-11-12 DIAGNOSIS — Z48815 Encounter for surgical aftercare following surgery on the digestive system: Secondary | ICD-10-CM | POA: Diagnosis not present

## 2019-11-12 DIAGNOSIS — Z993 Dependence on wheelchair: Secondary | ICD-10-CM | POA: Diagnosis not present

## 2019-11-12 DIAGNOSIS — Z9049 Acquired absence of other specified parts of digestive tract: Secondary | ICD-10-CM | POA: Diagnosis not present

## 2019-11-12 DIAGNOSIS — G20 Parkinson's disease: Secondary | ICD-10-CM | POA: Diagnosis not present

## 2019-11-12 DIAGNOSIS — E119 Type 2 diabetes mellitus without complications: Secondary | ICD-10-CM | POA: Diagnosis not present

## 2019-11-12 DIAGNOSIS — Z7984 Long term (current) use of oral hypoglycemic drugs: Secondary | ICD-10-CM | POA: Diagnosis not present

## 2019-11-14 DIAGNOSIS — Z9049 Acquired absence of other specified parts of digestive tract: Secondary | ICD-10-CM | POA: Diagnosis not present

## 2019-11-23 ENCOUNTER — Other Ambulatory Visit: Payer: Self-pay | Admitting: *Deleted

## 2019-11-23 DIAGNOSIS — E119 Type 2 diabetes mellitus without complications: Secondary | ICD-10-CM

## 2019-11-23 MED ORDER — METFORMIN HCL 500 MG PO TABS: 500.0000 mg | ORAL_TABLET | Freq: Every day | ORAL | 1 refills | Status: DC

## 2019-12-01 ENCOUNTER — Other Ambulatory Visit: Payer: Self-pay | Admitting: Family Medicine

## 2019-12-01 DIAGNOSIS — G20 Parkinson's disease: Secondary | ICD-10-CM

## 2019-12-04 ENCOUNTER — Telehealth: Payer: Self-pay

## 2019-12-04 NOTE — Telephone Encounter (Signed)
Paige Moore called on behalf of patient requesting a note from provider. Per Paige Moore, pt's Parkinson's is worsening, making it difficult to get around. Pt currently lives on the second floor, but family is trying to move her to a ground level floor apartment. Letter needs to have a list of pt's conditions and diagnosis, along with the reason why she would benefit living in a lower level apartment. Paige Moore will stop by the office to pick up the letter once available.

## 2019-12-05 NOTE — Telephone Encounter (Signed)
Letter printed and available for pickup.

## 2019-12-05 NOTE — Telephone Encounter (Signed)
Task completed. Pt has been updated that letter is available for pick up. She will have Vonna stop by the office.

## 2019-12-11 IMAGING — DX DG FINGER LITTLE 2+V*R*
3 series · 3 of 3 positions shown · non-contrast
Comparison: Plain films right little finger 06/28/2017 and
07/07/2017. Plain films of the right hand 07/07/2017.

CLINICAL DATA: Subluxation of the PIP joint right little finger.
Status post reduction.

EXAM:
RIGHT LITTLE FINGER 2+V

[finger ap]
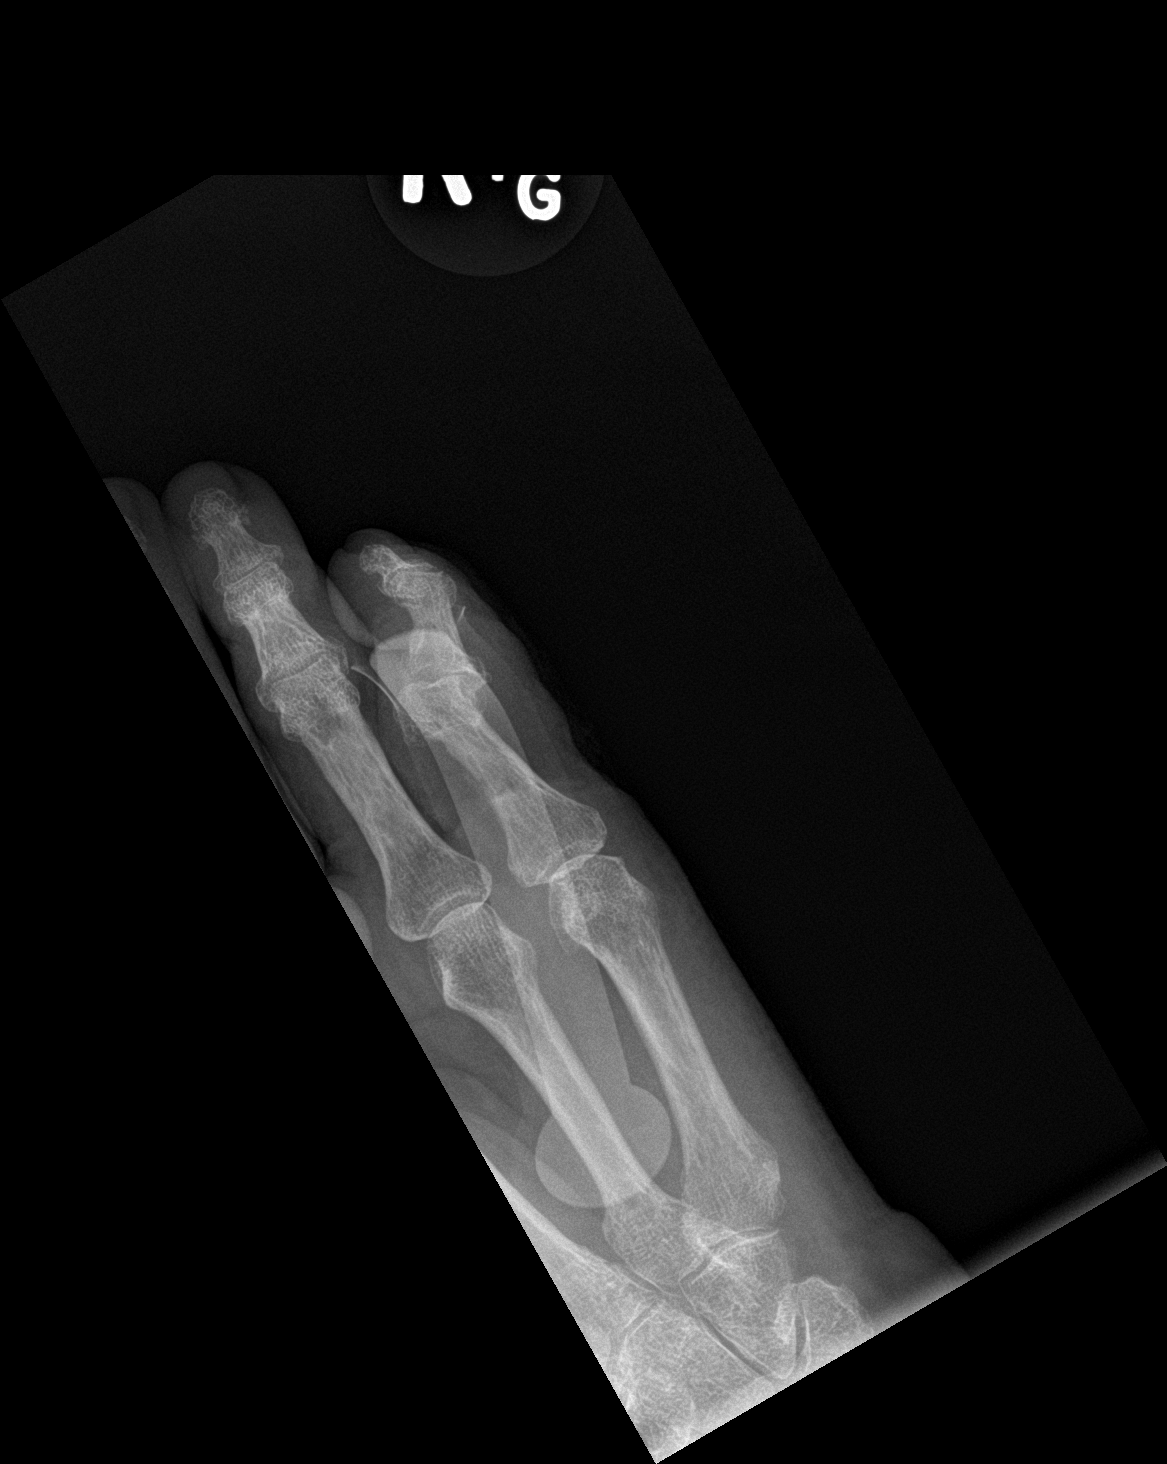

[finger obl]
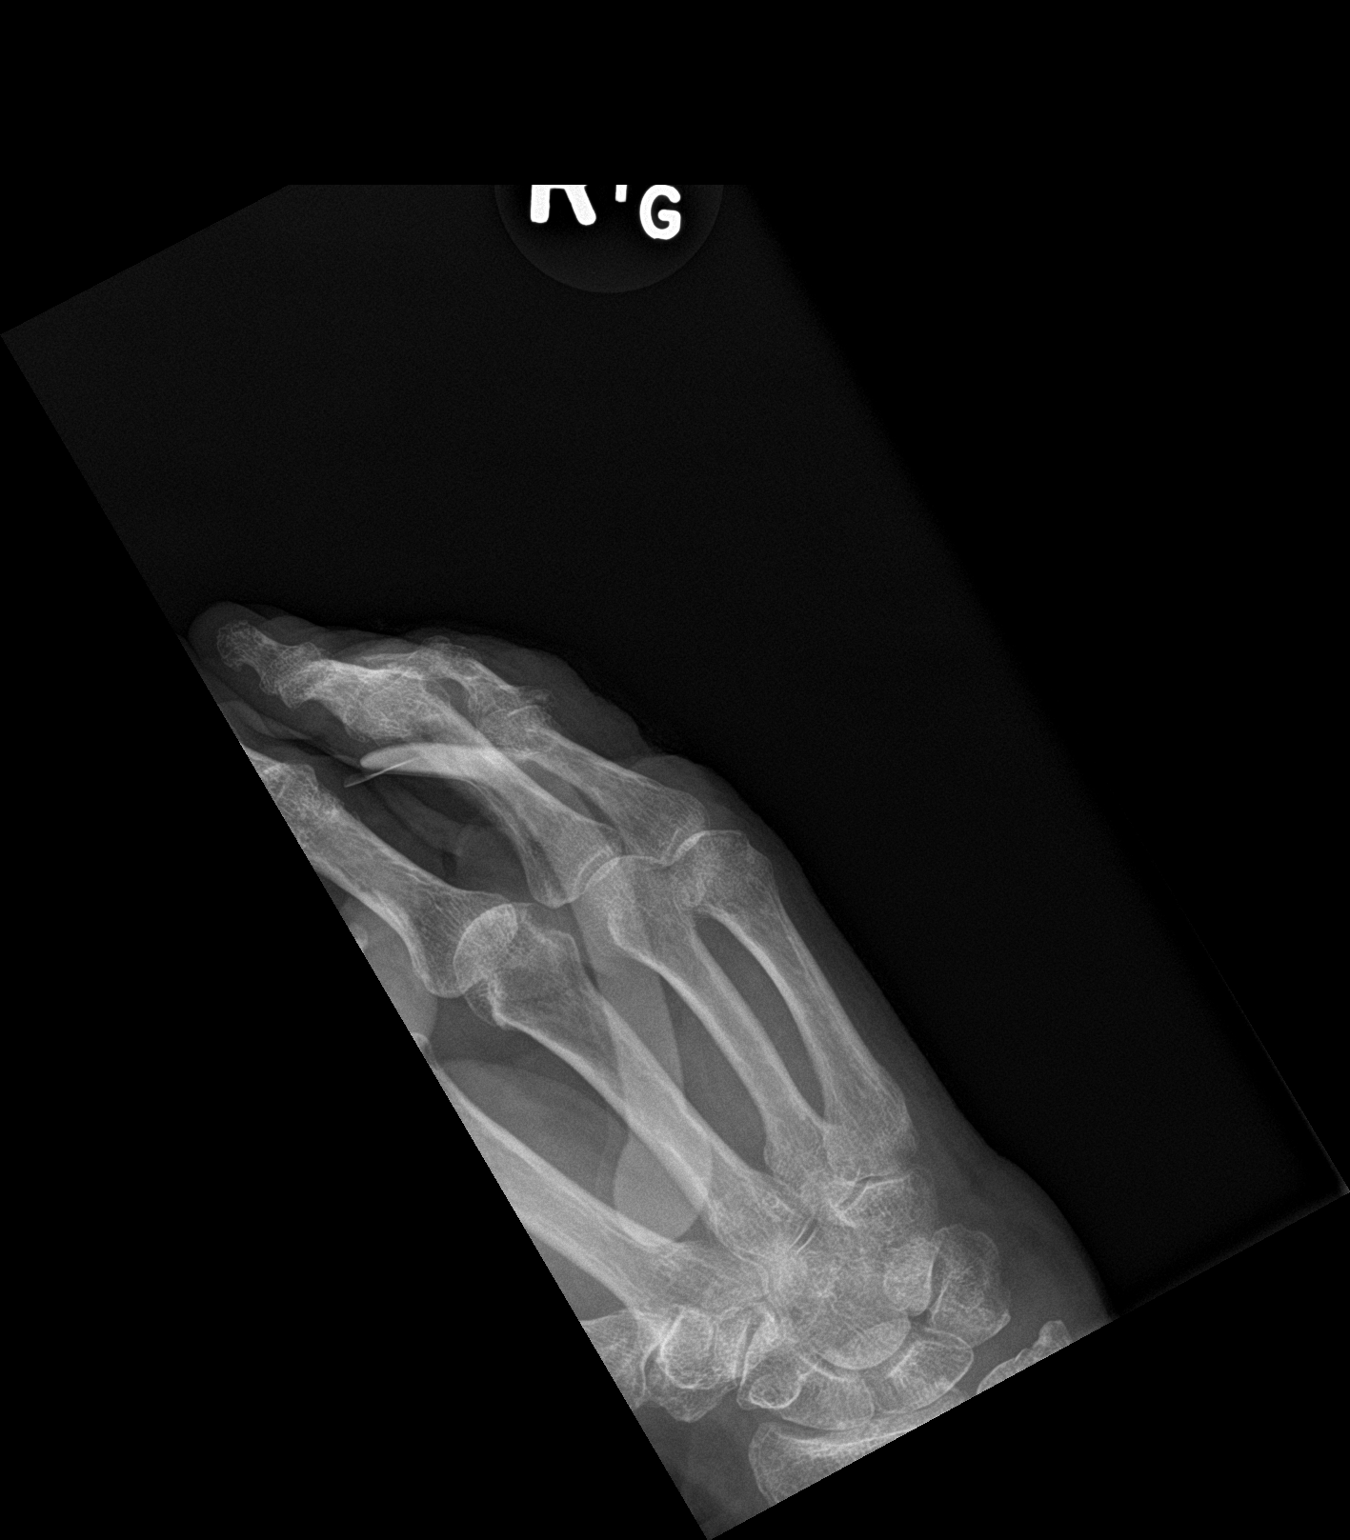

[finger lat]
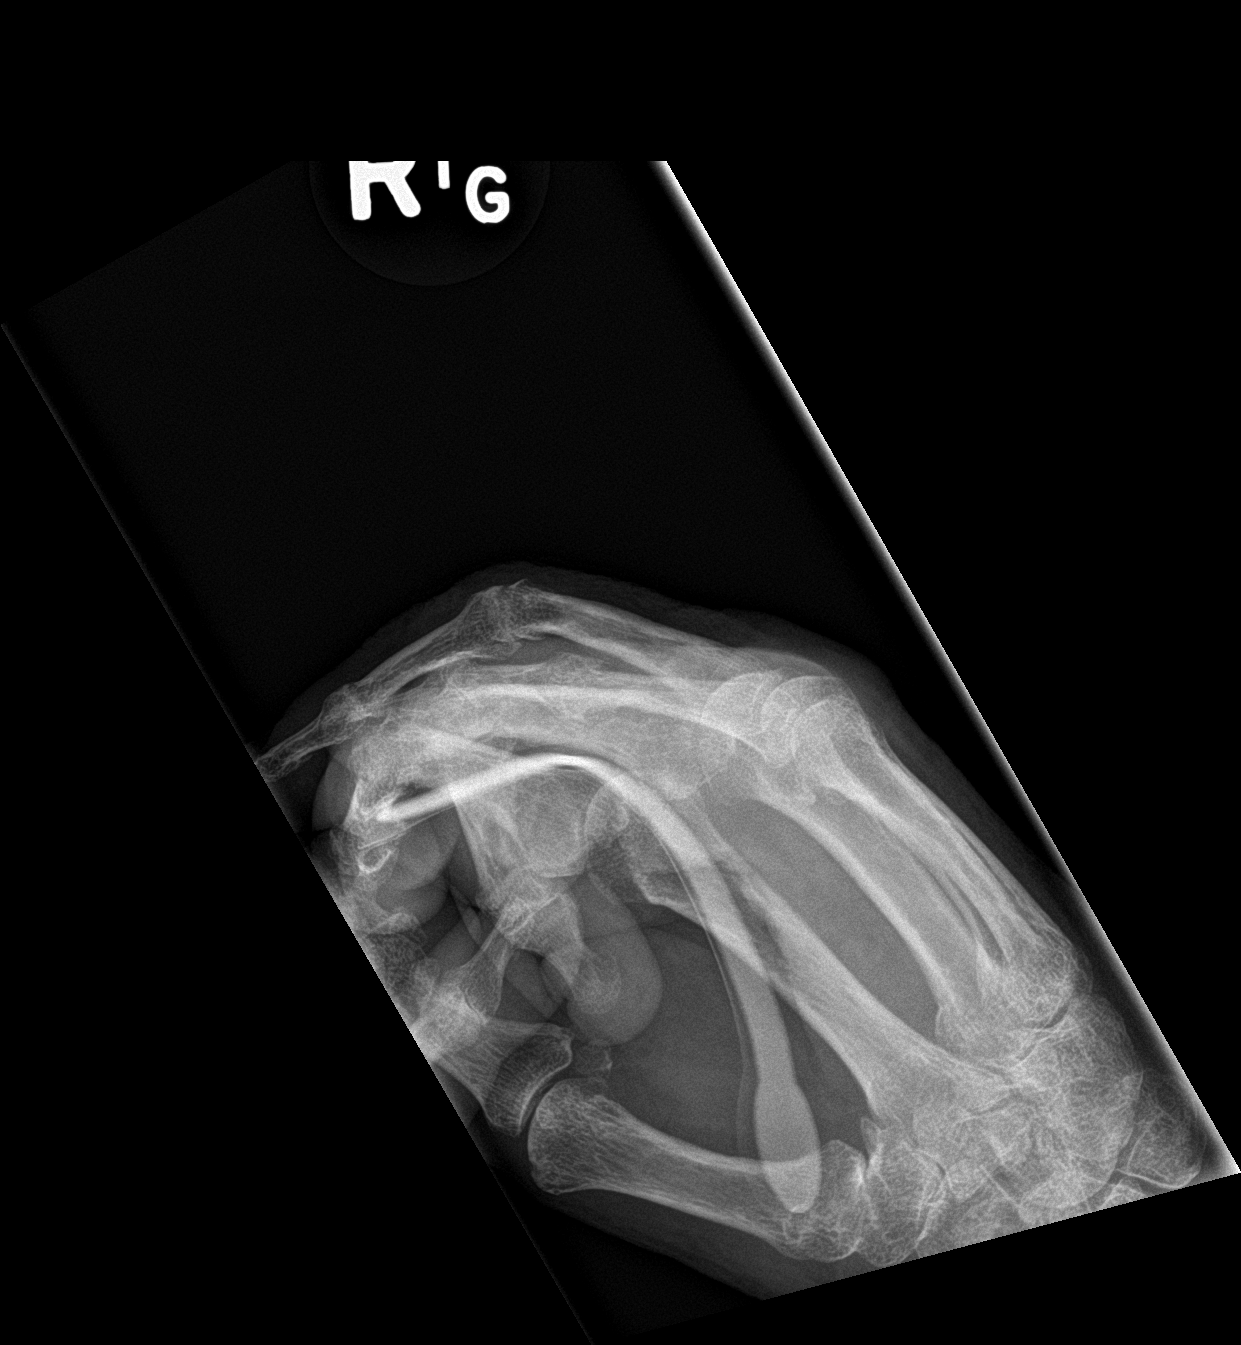

[3 of 3 positions shown; findings below may reference images not displayed]

FINDINGS: Fiberglass splint in place on the most recent examination has been
removed. Lateral angulation of the PIP joint the right little finger
persists. The PIP joint is in mild flexion. There is degenerative
change about both the PIP and DIP joints.
IMPRESSION: Mild flexion of the PIP joint right little finger. Lateral
angulation of the PIP joint persists. No new abnormality.

## 2019-12-14 DIAGNOSIS — Z9049 Acquired absence of other specified parts of digestive tract: Secondary | ICD-10-CM | POA: Diagnosis not present

## 2019-12-18 IMAGING — DX DG FINGER LITTLE 2+V*R*
3 series · 3 of 3 positions shown · non-contrast
Comparison: 07/07/2017.

CLINICAL DATA: Status post reduction PIP joint.

EXAM:
RIGHT LITTLE FINGER 2+V

[finger ap]
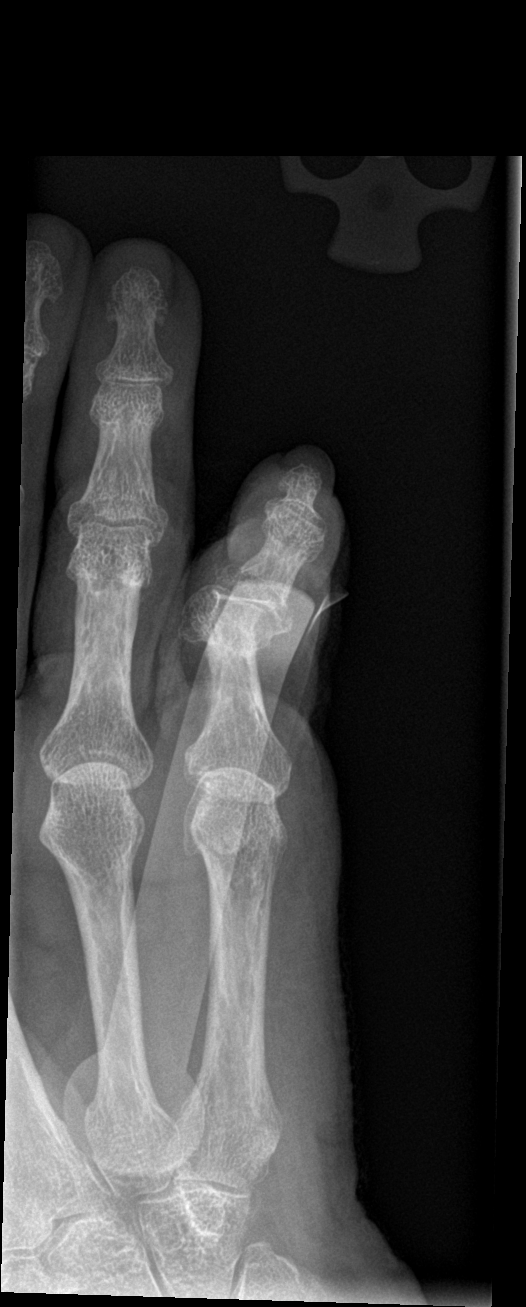

[finger obl]
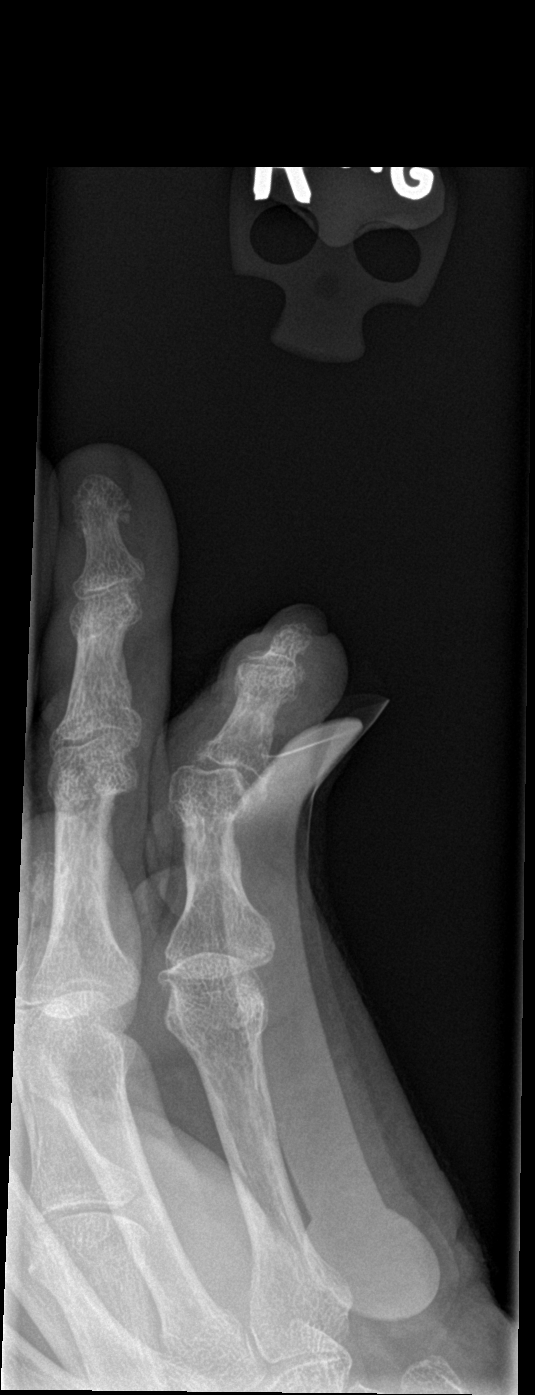

[finger lat]
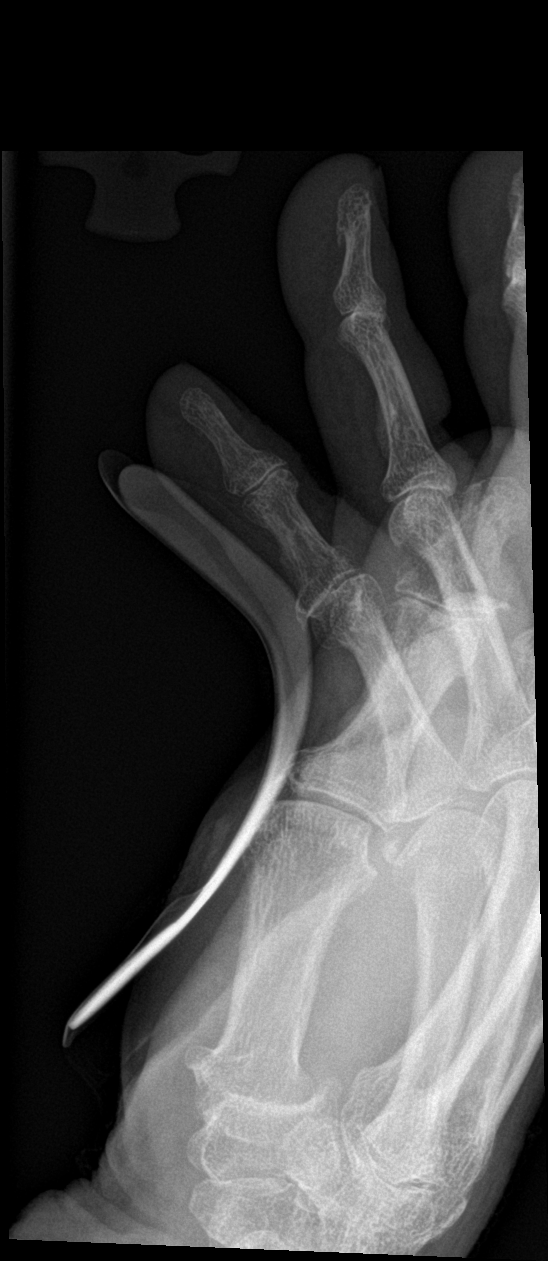

[3 of 3 positions shown; findings below may reference images not displayed]

FINDINGS: Splint noted. Stable flexion and lateral angulation of the PIP joint
of the right fifth digit again noted. No evidence of acute fracture
or dislocation. Diffuse osteopenia degenerative change.
IMPRESSION: Stable flexion lateral angulation of the PIP joint of the right
fifth digit again noted. No evidence of acute fracture or
dislocation.

## 2020-01-01 NOTE — Telephone Encounter (Signed)
I received a call from Veronia Beets the site Freight forwarder at [facility] and she reports that she did receive the ltter from Dr Madilyn Fireman, but will need more specific detail/reason t move patient to a ground level apartment. She states she understand that patient has Parkinsons and has gait instability, but there are elevators available.   She states that since it is a senior apartment complex they receive many requests to move to ground floor apartments and corporate requires more specific reasoning.   Please advise if there are any additional things I can add to letter and re-send  Contact: Veronia Beets, Site Manager Phone: [phone] Fax: [phone]

## 2020-01-12 NOTE — Telephone Encounter (Signed)
She is very high fall risk because of her postural instability with parkinson. Maybe we can add that.  I didn't know they have elevators.  i'm not sure what else to add.  Thank you for working on this [place].

## 2020-01-14 DIAGNOSIS — Z9049 Acquired absence of other specified parts of digestive tract: Secondary | ICD-10-CM | POA: Diagnosis not present

## 2020-01-16 NOTE — Telephone Encounter (Signed)
I updated the letter and faxed it back to [facility]

## 2020-01-22 ENCOUNTER — Ambulatory Visit: Payer: Medicare Other | Admitting: Family Medicine

## 2020-01-23 ENCOUNTER — Encounter: Payer: Self-pay | Admitting: Family Medicine

## 2020-01-23 ENCOUNTER — Ambulatory Visit (INDEPENDENT_AMBULATORY_CARE_PROVIDER_SITE_OTHER): Payer: Medicare Other | Admitting: Family Medicine

## 2020-01-23 VITALS — BP 135/76 | HR 77 | Ht 64.0 in | Wt 185.0 lb

## 2020-01-23 DIAGNOSIS — Z23 Encounter for immunization: Secondary | ICD-10-CM

## 2020-01-23 DIAGNOSIS — E118 Type 2 diabetes mellitus with unspecified complications: Secondary | ICD-10-CM

## 2020-01-23 DIAGNOSIS — G20 Parkinson's disease: Secondary | ICD-10-CM | POA: Diagnosis not present

## 2020-01-23 DIAGNOSIS — D071 Carcinoma in situ of vulva: Secondary | ICD-10-CM | POA: Diagnosis not present

## 2020-01-23 LAB — BASIC METABOLIC PANEL WITH GFR
BUN: 16 mg/dL (ref 7–25)
CO2: 28 mmol/L (ref 20–32)
Calcium: 9.8 mg/dL (ref 8.6–10.4)
Chloride: 106 mmol/L (ref 98–110)
Creat: 0.74 mg/dL (ref 0.60–0.93)
GFR, Est African American: 92 mL/min/{1.73_m2} (ref >=60)
GFR, Est Non African American: 80 mL/min/{1.73_m2} (ref >=60)
Glucose, Bld: 100 mg/dL — ABNORMAL HIGH (ref 65–99)
Potassium: 4.2 mmol/L (ref 3.5–5.3)
Sodium: 143 mmol/L (ref 135–146)

## 2020-01-23 LAB — POCT GLYCOSYLATED HEMOGLOBIN (HGB A1C): Hemoglobin A1C: 6.1 % — AB (ref 4.0–5.6)

## 2020-01-23 MED ORDER — CARBIDOPA-LEVODOPA ER 50-200 MG PO TBCR: 1.0000 | EXTENDED_RELEASE_TABLET | Freq: Two times a day (BID) | ORAL | 2 refills | Status: DC

## 2020-01-23 NOTE — Progress Notes (Signed)
Established Patient Office Visit  Subjective:  Patient ID: Paige Moore, female    DOB: May 03, 1945  Age: 74 y.o. MRN: [id number]  CC:  Chief Complaint  Patient presents with  . Diabetes    HPI Paige Moore presents for   Diabetes - no hypoglycemic events. No wounds or sores that are not healing well. No increased thirst or urination. Checking glucose at home. Taking medications as prescribed without any side effects. Has been eating chips but says she portion controls.   F/U parkinson's -her tremor particularly in her left hand has been getting a little worse over the last year we discussed possibly adjusting her medications when she came in last time but she had just had gallbladder surgery and had just filled a 90-day supply and wanted to put off and talk about it again today.  She is interested in increasing her medication regimen she does not feel like her medicine wears off which is good.      Past Medical History:  Diagnosis Date  . Essential tremor   . Hyperlipidemia   . OA (osteoarthritis)   . PONV (postoperative nausea and vomiting)   . Prediabetes   . VIN III (vulvar intraepithelial neoplasia III)   . Wears dentures    upper  . Wears glasses     Past Surgical History:  Procedure Laterality Date  . CESAREAN SECTION     X 2  . Corneal topography  10/09/2018  . KNEE ARTHROSCOPY Left   . ORIF RIGHT ANKLE FX  1987   later hardware removed  . PLANTAR FASCIA RELEASE  12-02-2003   RIGHT HEEL  . TOTAL ABDOMINAL HYSTERECTOMY  age 60  . TOTAL HIP ARTHROPLASTY Left 7/ 2011    [facility]  . TOTAL KNEE ARTHROPLASTY Left 07-11-2010   [facility]  . VULVECTOMY Left 10/11/2013   Procedure: WIDE LOCAL EXCISON OF LEFT VULVA;  Surgeon: Everitt Amber, MD;  Location: [facility];  Service: Gynecology;  Laterality: Left;  . WRIST GANGLION EXCISION Left 2001    Family History  Problem Relation Age of Onset  . Diabetes Father   . Cancer Sister        endometrial cancer   . Osteoporosis Mother   . Hypothyroidism Mother     Social History   Socioeconomic History  . Marital status: Widowed    Spouse name: Not on file  . Number of children: 2  . Years of education: 8  . Highest education level: 8th grade  Occupational History  . Occupation: [employer]    Comment: retired  Tobacco Use  . Smoking status: Former Smoker    Packs/day: 0.25    Years: 15.00    Pack years: 3.75    Types: Cigarettes    Quit date: 03/15/2006    Years since quitting: 13.8  . Smokeless tobacco: Never Used  Vaping Use  . Vaping Use: Never used  Substance and Sexual Activity  . Alcohol use: No  . Drug use: No  . Sexual activity: Not Currently  Other Topics Concern  . 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 Likes to walk in her neighborhood. Drinks a lot of water daiily   Social Determinants of Health   Financial Resource Strain:   . Difficulty of Paying Living Expenses: Not on file  Food Insecurity:   . Worried About Charity fundraiser in the Last Year: Not on file  .  Ran Out of Food in the Last Year: Not on file  Transportation Needs:   . Lack of Transportation (Medical): Not on file  . Lack of Transportation (Non-Medical): Not on file  Physical Activity:   . Days of Exercise per Week: Not on file  . Minutes of Exercise per Session: Not on file  Stress:   . Feeling of Stress : Not on file  Social Connections:   . Frequency of Communication with Friends and Family: Not on file  . Frequency of Social Gatherings with Friends and Family: Not on file  . Attends Religious Services: Not on file  . Active Member of Clubs or Organizations: Not on file  . Attends Archivist Meetings: Not on file  . Marital Status: Not on file  Intimate Partner Violence:   . Fear of Current or Ex-Partner: Not on file  . Emotionally Abused: Not on file  . Physically Abused: Not on file  . Sexually Abused: Not on file     Outpatient Medications Prior to Visit  Medication Sig Dispense Refill  . acetaminophen (TYLENOL) 500 MG tablet Take by mouth. 1-2 tabs po tid prn     . atorvastatin (LIPITOR) 40 MG tablet Take 1 tablet (40 mg total) by mouth daily. 90 tablet 3  . Blood Glucose Monitoring Suppl (ONE TOUCH ULTRA SYSTEM KIT) W/DEVICE KIT 1 kit by Does not apply route once. 1 each 0  . docusate sodium (COLACE) 100 MG capsule Take 1 capsule (100 mg total) by mouth 2 (two) times daily. 20 capsule 0  . glucose blood (ONE TOUCH ULTRA TEST) test strip USE 1 STRIP TO CHECK GLUCOSE ONCE DAILY 100 each 5  . ibuprofen (ADVIL,MOTRIN) 200 MG tablet Take 200 mg by mouth every 6 (six) hours as needed.    . metFORMIN (GLUCOPHAGE) 500 MG tablet Take 1 tablet (500 mg total) by mouth daily with breakfast. 90 tablet 1  . Omega-3 1000 MG CAPS Take by mouth.    Glory Rosebush DELICA LANCETS 31D MISC USE 1  TO CHECK GLUCOSE ONCE DAILY 100 each 5  . Carbidopa-Levodopa ER (SINEMET CR) 25-100 MG tablet controlled release Take 1 tablet by mouth twice daily 180 tablet 0   No facility-administered medications prior to visit.    Allergies  Allergen Reactions  . Ropinirole Hcl Other (See Comments)    Left foot swelling.  Other reaction(s): Other Left foot swelling.     ROS Review of Systems    Objective:    Physical Exam Constitutional:      Appearance: She is well-developed.  HENT:     Head: Normocephalic and atraumatic.  Cardiovascular:     Rate and Rhythm: Normal rate and regular rhythm.     Heart sounds: Normal heart sounds.  Pulmonary:     Effort: Pulmonary effort is normal.     Breath sounds: Normal breath sounds.  Musculoskeletal:     Comments: Significant tremor worse on the left compared to the right.   Skin:    General: Skin is warm and dry.  Neurological:     Mental Status: She is alert and oriented to person, place, and time.  Psychiatric:        Behavior: Behavior normal.     BP 135/76   Pulse 77    Ht '5\' 4"'  (1.626 m)   Wt 185 lb (83.9 kg)   SpO2 97%   BMI 31.76 kg/m  Wt Readings from Last 3 Encounters:  01/23/20 185 lb (83.9  kg)  09/24/19 187 lb (84.8 kg)  05/15/19 197 lb (89.4 kg)     Health Maintenance Due  Topic Date Due  . OPHTHALMOLOGY EXAM  11/08/2019  . URINE MICROALBUMIN  01/03/2020    There are no preventive care reminders to display for this patient.  Lab Results  Component Value Date   TSH 1.84 01/10/2018   Lab Results  Component Value Date   WBC 5.6 07/03/2012   HGB 14.9 02/02/2016   HCT 43.0 10/11/2013   MCV 90.1 07/03/2012   PLT 170 07/03/2012   Lab Results  Component Value Date   NA 141 05/15/2019   K 4.1 05/15/2019   CO2 28 05/15/2019   GLUCOSE 99 05/15/2019   BUN 18 05/15/2019   CREATININE 0.82 05/15/2019   BILITOT 0.6 05/15/2019   ALKPHOS 61 02/02/2016   AST 18 05/15/2019   ALT 14 05/15/2019   PROT 7.3 05/15/2019   ALBUMIN 4.4 02/02/2016   CALCIUM 9.8 05/15/2019   Lab Results  Component Value Date   CHOL 115 05/15/2019   Lab Results  Component Value Date   HDL 50 05/15/2019   Lab Results  Component Value Date   LDLCALC 49 05/15/2019   Lab Results  Component Value Date   TRIG 82 05/15/2019   Lab Results  Component Value Date   CHOLHDL 2.3 05/15/2019   Lab Results  Component Value Date   HGBA1C 6.1 (A) 01/23/2020      Assessment & Plan:   Problem List Items Addressed This Visit      Endocrine   Controlled diabetes mellitus type 2 with complications ([place])    Well controlled. Continue current regimen. Continue to work on low carb diet. Follow up in 4 months.       Relevant Orders   POCT glycosylated hemoglobin (Hb A1C) (Completed)   BASIC METABOLIC PANEL WITH GFR     Nervous and Auditory   Parkinson disease ([place])    Discussed options. Discussed consult with neurology at [facility] to consider DBS.  She says that she will think about it.  For now she would like to try increasing her medication.  But wants to  wait until she is due for her refill in December.  Went ahead and sent prescription to be placed on file until she is ready for it I would then like to see her back in about 2 to 3 months after she starts the new medication.      Relevant Medications   carbidopa-levodopa (SINEMET CR) 50-200 MG tablet     Genitourinary   Vulvar intraepithelial neoplasia grade 3    In looking back through her records she was actually diagnosed with VIN grade 3 in 2015 and actually had surgery per the records she was post to follow back up with a surgeon in 1 year which she never did and she never followed back up with gynecology.  She says she does not really want to address it she does not want to follow back up.  She says she is "fine".  Will address again at future visit to see if may be accurately to convince her to go for a checkup.       Other Visit Diagnoses    Need for immunization against influenza    -  Primary   Relevant Orders   Flu Vaccine QUAD High Dose(Fluad) (Completed)      Meds ordered this encounter  Medications  . carbidopa-levodopa (SINEMET CR) 50-200 MG tablet  Sig: Take 1 tablet by mouth 2 (two) times daily.    Dispense:  60 tablet    Refill:  2    Place on file. Pt wants to wait until Dec to fill    Follow-up: Return in about 4 months (around 05/22/2020) for Diabetes follow-up.   I spent 35 minutes on the day of the encounter to include pre-visit record review, face-to-face time with the patient and post visit ordering of test.   Beatrice Lecher, MD

## 2020-01-23 NOTE — Assessment & Plan Note (Signed)
In looking back through her records she was actually diagnosed with VIN grade 3 in 2015 and actually had surgery per the records she was post to follow back up with a surgeon in 1 year which she never did and she never followed back up with gynecology.  She says she does not really want to address it she does not want to follow back up.  She says she is "fine".  Will address again at future visit to see if may be accurately to convince her to go for a checkup.

## 2020-01-23 NOTE — Assessment & Plan Note (Addendum)
Well controlled. Continue current regimen. Continue to work on low carb diet. Follow up in 4 months.

## 2020-01-23 NOTE — Assessment & Plan Note (Signed)
Discussed options. Discussed consult with neurology at [facility] to consider DBS.  She says that she will think about it.  For now she would like to try increasing her medication.  But wants to wait until she is due for her refill in December.  Went ahead and sent prescription to be placed on file until she is ready for it I would then like to see her back in about 2 to 3 months after she starts the new medication.

## 2020-01-24 NOTE — Progress Notes (Signed)
All labs are normal. 

## 2020-02-13 DIAGNOSIS — Z9049 Acquired absence of other specified parts of digestive tract: Secondary | ICD-10-CM | POA: Diagnosis not present

## 2020-03-15 DIAGNOSIS — Z9049 Acquired absence of other specified parts of digestive tract: Secondary | ICD-10-CM | POA: Diagnosis not present

## 2020-04-15 DIAGNOSIS — Z9049 Acquired absence of other specified parts of digestive tract: Secondary | ICD-10-CM | POA: Diagnosis not present

## 2020-05-13 ENCOUNTER — Other Ambulatory Visit: Payer: Self-pay | Admitting: Family Medicine

## 2020-05-13 DIAGNOSIS — E119 Type 2 diabetes mellitus without complications: Secondary | ICD-10-CM

## 2020-05-13 DIAGNOSIS — Z9049 Acquired absence of other specified parts of digestive tract: Secondary | ICD-10-CM | POA: Diagnosis not present

## 2020-05-27 ENCOUNTER — Ambulatory Visit: Payer: Medicare Other | Admitting: Family Medicine

## 2020-06-03 ENCOUNTER — Encounter: Payer: Self-pay | Admitting: Family Medicine

## 2020-06-03 ENCOUNTER — Ambulatory Visit (INDEPENDENT_AMBULATORY_CARE_PROVIDER_SITE_OTHER): Payer: Medicare Other | Admitting: Family Medicine

## 2020-06-03 ENCOUNTER — Other Ambulatory Visit: Payer: Self-pay

## 2020-06-03 VITALS — BP 136/78 | HR 102 | Ht 64.0 in | Wt 184.0 lb

## 2020-06-03 DIAGNOSIS — R809 Proteinuria, unspecified: Secondary | ICD-10-CM | POA: Diagnosis not present

## 2020-06-03 DIAGNOSIS — E785 Hyperlipidemia, unspecified: Secondary | ICD-10-CM | POA: Diagnosis not present

## 2020-06-03 DIAGNOSIS — G20 Parkinson's disease: Secondary | ICD-10-CM | POA: Diagnosis not present

## 2020-06-03 DIAGNOSIS — E1169 Type 2 diabetes mellitus with other specified complication: Secondary | ICD-10-CM | POA: Diagnosis not present

## 2020-06-03 DIAGNOSIS — Z8349 Family history of other endocrine, nutritional and metabolic diseases: Secondary | ICD-10-CM | POA: Diagnosis not present

## 2020-06-03 DIAGNOSIS — E118 Type 2 diabetes mellitus with unspecified complications: Secondary | ICD-10-CM | POA: Diagnosis not present

## 2020-06-03 DIAGNOSIS — G20A1 Parkinson's disease without dyskinesia, without mention of fluctuations: Secondary | ICD-10-CM

## 2020-06-03 LAB — POCT UA - MICROALBUMIN
Albumin/Creatinine Ratio, Urine, POC: <30
Creatinine, POC: 200 mg/dL
Microalbumin Ur, POC: 30 mg/L

## 2020-06-03 LAB — POCT GLYCOSYLATED HEMOGLOBIN (HGB A1C): Hemoglobin A1C: 6.1 % — AB (ref 4.0–5.6)

## 2020-06-03 MED ORDER — ATORVASTATIN CALCIUM 40 MG PO TABS: 40.0000 mg | ORAL_TABLET | Freq: Every day | ORAL | 3 refills | Status: DC

## 2020-06-03 MED ORDER — CARBIDOPA-LEVODOPA ER 50-200 MG PO TBCR: 1.0000 | EXTENDED_RELEASE_TABLET | Freq: Two times a day (BID) | ORAL | 1 refills | Status: DC

## 2020-06-03 MED ORDER — CARBIDOPA-LEVODOPA 25-100 MG PO TABS: 1.0000 | ORAL_TABLET | Freq: Every day | ORAL | 1 refills | Status: DC

## 2020-06-03 NOTE — Assessment & Plan Note (Signed)
A1c looks great today at 6.1.  Stable from previous.  Plan to follow-up in 3 months.  Continue current regimen.  She is tolerating Metformin well

## 2020-06-03 NOTE — Assessment & Plan Note (Signed)
Tolerating statin well.  Refill sent to pharmacy for 1 year.

## 2020-06-03 NOTE — Progress Notes (Signed)
Established Patient Office Visit  Subjective:  Patient ID: Paige Moore, female    DOB: [birth year]  Age: 75 y.o. MRN: [id number]  CC:  Chief Complaint  Patient presents with  . Diabetes  . Tremors    HPI CHAKIA COUNTS presents for Diabetes - no hypoglycemic events. No wounds or sores that are not healing well. No increased thirst or urination. Checking glucose at home. Taking medications as prescribed without any side effects.  F/U Parkinson's -we increased her carbidopa levodopa in December.  He does feel like the increased dose has been helpful but would like to go up more if possible she has been experiencing some frequent freezing.  She did run out of her medication about 2 or 3 days ago so is completely off of it today.  She says the medication makes her feel sleepy when she takes it she says she typically will fall asleep about an hour after taking it for about 15 or 20 minutes.  Denies any excess snoring or waking up with headaches in the morning.  Hyperlipidemia - tolerating stating well with no myalgias or significant side effects.  Lab Results  Component Value Date   CHOL 120 06/03/2020   HDL 60 06/03/2020   LDLCALC 44 06/03/2020   LDLDIRECT 112 (H) 10/24/2007   TRIG 82 06/03/2020   CHOLHDL 2.0 06/03/2020      Past Medical History:  Diagnosis Date  . Essential tremor   . Hyperlipidemia   . OA (osteoarthritis)   . PONV (postoperative nausea and vomiting)   . Prediabetes   . VIN III (vulvar intraepithelial neoplasia III)   . Wears dentures    upper  . Wears glasses     Past Surgical History:  Procedure Laterality Date  . CESAREAN SECTION     X 2  . Corneal topography  10/09/2018  . KNEE ARTHROSCOPY Left   . ORIF RIGHT ANKLE FX  1987   later hardware removed  . PLANTAR FASCIA RELEASE  12-02-2003   RIGHT HEEL  . TOTAL ABDOMINAL HYSTERECTOMY  age 44  . TOTAL HIP ARTHROPLASTY Left 7/ 2011    [facility]  . TOTAL KNEE ARTHROPLASTY Left 07-11-2010   [facility]  .  VULVECTOMY Left 10/11/2013   Procedure: WIDE LOCAL EXCISON OF LEFT VULVA;  Surgeon: Everitt Amber, MD;  Location: [place];  Service: Gynecology;  Laterality: Left;  . WRIST GANGLION EXCISION Left 2001    Family History  Problem Relation Age of Onset  . Diabetes Father   . Cancer Sister        endometrial cancer  . Osteoporosis Mother   . Hypothyroidism Mother     Social History   Socioeconomic History  . Marital status: Widowed    Spouse name: Not on file  . Number of children: 2  . Years of education: 8  . Highest education level: 8th grade  Occupational History  . Occupation: [employer]    Comment: retired  Tobacco Use  . Smoking status: Former Smoker    Packs/day: 0.25    Years: 15.00    Pack years: 3.75    Types: Cigarettes    Quit date: 03/15/2006    Years since quitting: 14.2  . Smokeless tobacco: Never Used  Vaping Use  . Vaping Use: Never used  Substance and Sexual Activity  . Alcohol use: No  . Drug use: No  . Sexual activity: Not Currently  Other Topics Concern  . 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 Likes to walk in her neighborhood. Drinks a lot of water daiily   Social Determinants of Health   Financial Resource Strain: Not on file  Food Insecurity: Not on file  Transportation Needs: Not on file  Physical Activity: Not on file  Stress: Not on file  Social Connections: Not on file  Intimate Partner Violence: Not on file    Outpatient Medications Prior to Visit  Medication Sig Dispense Refill  . acetaminophen (TYLENOL) 500 MG tablet Take by mouth. 1-2 tabs po tid prn    . Blood Glucose Monitoring Suppl (ONE TOUCH ULTRA SYSTEM KIT) W/DEVICE KIT 1 kit by Does not apply route once. 1 each 0  . docusate sodium (COLACE) 100 MG capsule Take 1 capsule (100 mg total) by mouth 2 (two) times daily. 20 capsule 0  . glucose blood (ONE TOUCH ULTRA TEST) test strip USE 1 STRIP TO CHECK  GLUCOSE ONCE DAILY 100 each 5  . ibuprofen (ADVIL,MOTRIN) 200 MG tablet Take 200 mg by mouth every 6 (six) hours as needed.    . metFORMIN (GLUCOPHAGE) 500 MG tablet Take 1 tablet by mouth once daily with breakfast 90 tablet 0  . Omega-3 1000 MG CAPS Take by mouth.    Glory Rosebush DELICA LANCETS 63W MISC USE 1  TO CHECK GLUCOSE ONCE DAILY 100 each 5  . atorvastatin (LIPITOR) 40 MG tablet Take 1 tablet (40 mg total) by mouth daily. 90 tablet 3  . carbidopa-levodopa (SINEMET CR) 50-200 MG tablet Take 1 tablet by mouth 2 (two) times daily. 60 tablet 2   No facility-administered medications prior to visit.    Allergies  Allergen Reactions  . Ropinirole Hcl Other (See Comments)    Left foot swelling.  Other reaction(s): Other Left foot swelling.     ROS Review of Systems    Objective:    Physical Exam Constitutional:      Appearance: She is well-developed.  HENT:     Head: Normocephalic and atraumatic.  Cardiovascular:     Rate and Rhythm: Normal rate and regular rhythm.     Heart sounds: Normal heart sounds.  Pulmonary:     Effort: Pulmonary effort is normal.     Breath sounds: Normal breath sounds.  Skin:    General: Skin is warm and dry.  Neurological:     Mental Status: She is alert and oriented to person, place, and time.  Psychiatric:        Behavior: Behavior normal.     BP 136/78   Pulse (!) 102   Ht '5\' 4"'  (1.626 m)   Wt 184 lb (83.5 kg)   SpO2 94%   BMI 31.58 kg/m  Wt Readings from Last 3 Encounters:  06/03/20 184 lb (83.5 kg)  01/23/20 185 lb (83.9 kg)  09/24/19 187 lb (84.8 kg)     There are no preventive care reminders to display for this patient.  There are no preventive care reminders to display for this patient.  Lab Results  Component Value Date   TSH 2.04 06/03/2020   Lab Results  Component Value Date   WBC 5.6 07/03/2012   HGB 14.9 02/02/2016   HCT 43.0 10/11/2013   MCV 90.1 07/03/2012   PLT 170 07/03/2012   Lab Results  Component  Value Date   NA 142 06/03/2020   K 4.4 06/03/2020   CO2 29 06/03/2020   GLUCOSE 100 (H) 06/03/2020  BUN 19 06/03/2020   CREATININE 0.73 06/03/2020   BILITOT 1.0 06/03/2020   ALKPHOS 61 02/02/2016   AST 14 06/03/2020   ALT 14 06/03/2020   PROT 7.5 06/03/2020   ALBUMIN 4.4 02/02/2016   CALCIUM 9.8 06/03/2020   Lab Results  Component Value Date   CHOL 120 06/03/2020   Lab Results  Component Value Date   HDL 60 06/03/2020   Lab Results  Component Value Date   LDLCALC 44 06/03/2020   Lab Results  Component Value Date   TRIG 82 06/03/2020   Lab Results  Component Value Date   CHOLHDL 2.0 06/03/2020   Lab Results  Component Value Date   HGBA1C 6.1 (A) 06/03/2020      Assessment & Plan:   Problem List Items Addressed This Visit      Endocrine   Hyperlipidemia associated with type 2 diabetes mellitus ([place])   Relevant Medications   atorvastatin (LIPITOR) 40 MG tablet   Controlled diabetes mellitus type 2 with complications ([facility]) - Primary    A1c looks great today at 6.1.  Stable from previous.  Plan to follow-up in 3 months.  Continue current regimen.  She is tolerating Metformin well      Relevant Medications   atorvastatin (LIPITOR) 40 MG tablet   Other Relevant Orders   POCT glycosylated hemoglobin (Hb A1C) (Completed)   Lipid Panel w/reflex Direct LDL (Completed)   COMPLETE METABOLIC PANEL WITH GFR (Completed)   TSH (Completed)   POCT UA - Microalbumin (Completed)     Nervous and Auditory   Parkinson disease ([place])    She would really like to increase her medication but it sounds like she is getting frequent freezing throughout the day even just walking down the hall to go to the bathroom.  We will add a short acting Sinemet 25/100 around 2 PM in the afternoon since she usually takes her first dose between 9 and 10 AM in the morning.  I like to see her back in a month to see if this is working better for her.      Relevant Medications   carbidopa-levodopa  (SINEMET CR) 50-200 MG tablet   carbidopa-levodopa (SINEMET) 25-100 MG tablet     Other   Microalbuminuria   Relevant Orders   COMPLETE METABOLIC PANEL WITH GFR (Completed)   POCT UA - Microalbumin (Completed)   Hyperlipidemia    Tolerating statin well.  Refill sent to pharmacy for 1 year.      Relevant Medications   atorvastatin (LIPITOR) 40 MG tablet   Other Relevant Orders   Lipid Panel w/reflex Direct LDL (Completed)   COMPLETE METABOLIC PANEL WITH GFR (Completed)   TSH (Completed)   Family history of thyroid disease in mother   Relevant Orders   Lipid Panel w/reflex Direct LDL (Completed)   COMPLETE METABOLIC PANEL WITH GFR (Completed)   TSH (Completed)      Meds ordered this encounter  Medications  . atorvastatin (LIPITOR) 40 MG tablet    Sig: Take 1 tablet (40 mg total) by mouth daily.    Dispense:  90 tablet    Refill:  3  . carbidopa-levodopa (SINEMET CR) 50-200 MG tablet    Sig: Take 1 tablet by mouth 2 (two) times daily.    Dispense:  180 tablet    Refill:  1  . carbidopa-levodopa (SINEMET) 25-100 MG tablet    Sig: Take 1 tablet by mouth daily at 12 noon.    Dispense:  90 tablet  Refill:  1    Follow-up: Return in about 1 month (around 07/04/2020) for check on new medication added to middle of the day for Parkinsons. Beatrice Lecher, MD

## 2020-06-03 NOTE — Patient Instructions (Signed)
Try taking the new Sinemet 25/100 in the middle of the day probably around 2PM ( after lunch)  and lets see if your tremor and freezing is better.

## 2020-06-03 NOTE — Assessment & Plan Note (Signed)
She would really like to increase her medication but it sounds like she is getting frequent freezing throughout the day even just walking down the hall to go to the bathroom.  We will add a short acting Sinemet 25/100 around 2 PM in the afternoon since she usually takes her first dose between 9 and 10 AM in the morning.  I like to see her back in a month to see if this is working better for her.

## 2020-06-04 ENCOUNTER — Encounter: Payer: Self-pay | Admitting: Family Medicine

## 2020-06-04 LAB — COMPLETE METABOLIC PANEL WITH GFR
AG Ratio: 1.6 (calc) (ref 1.0–2.5)
ALT: 14 U/L (ref 6–29)
AST: 14 U/L (ref 10–35)
Albumin: 4.6 g/dL (ref 3.6–5.1)
Alkaline phosphatase (APISO): 71 U/L (ref 37–153)
BUN: 19 mg/dL (ref 7–25)
CO2: 29 mmol/L (ref 20–32)
Calcium: 9.8 mg/dL (ref 8.6–10.4)
Chloride: 104 mmol/L (ref 98–110)
Creat: 0.73 mg/dL (ref 0.60–0.93)
GFR, Est African American: 94 mL/min/{1.73_m2} (ref >=60)
GFR, Est Non African American: 81 mL/min/{1.73_m2} (ref >=60)
Globulin: 2.9 g/dL (calc) (ref 1.9–3.7)
Glucose, Bld: 100 mg/dL — ABNORMAL HIGH (ref 65–99)
Potassium: 4.4 mmol/L (ref 3.5–5.3)
Sodium: 142 mmol/L (ref 135–146)
Total Bilirubin: 1 mg/dL (ref 0.2–1.2)
Total Protein: 7.5 g/dL (ref 6.1–8.1)

## 2020-06-04 LAB — LIPID PANEL W/REFLEX DIRECT LDL
Cholesterol: 120 mg/dL (ref <200)
HDL: 60 mg/dL (ref >=50)
LDL Cholesterol (Calc): 44 mg/dL (calc)
Non-HDL Cholesterol (Calc): 60 mg/dL (calc) (ref <130)
Total CHOL/HDL Ratio: 2 (calc) (ref <5.0)
Triglycerides: 82 mg/dL (ref <150)

## 2020-06-04 LAB — TSH: TSH: 2.04 mIU/L (ref 0.40–4.50)

## 2020-06-04 NOTE — Progress Notes (Signed)
All labs are normal. 

## 2020-06-13 DIAGNOSIS — Z9049 Acquired absence of other specified parts of digestive tract: Secondary | ICD-10-CM | POA: Diagnosis not present

## 2020-06-27 DIAGNOSIS — Z135 Encounter for screening for eye and ear disorders: Secondary | ICD-10-CM | POA: Diagnosis not present

## 2020-06-27 DIAGNOSIS — H43812 Vitreous degeneration, left eye: Secondary | ICD-10-CM | POA: Diagnosis not present

## 2020-06-27 DIAGNOSIS — E119 Type 2 diabetes mellitus without complications: Secondary | ICD-10-CM | POA: Diagnosis not present

## 2020-06-27 DIAGNOSIS — H524 Presbyopia: Secondary | ICD-10-CM | POA: Diagnosis not present

## 2020-06-27 DIAGNOSIS — H52223 Regular astigmatism, bilateral: Secondary | ICD-10-CM | POA: Diagnosis not present

## 2020-06-27 DIAGNOSIS — Z961 Presence of intraocular lens: Secondary | ICD-10-CM | POA: Diagnosis not present

## 2020-06-27 LAB — HM DIABETES EYE EXAM

## 2020-07-03 ENCOUNTER — Ambulatory Visit: Payer: Medicare Other | Admitting: Family Medicine

## 2020-07-10 ENCOUNTER — Encounter: Payer: Self-pay | Admitting: Family Medicine

## 2020-07-10 ENCOUNTER — Ambulatory Visit (INDEPENDENT_AMBULATORY_CARE_PROVIDER_SITE_OTHER): Payer: Medicare Other | Admitting: Family Medicine

## 2020-07-10 ENCOUNTER — Other Ambulatory Visit: Payer: Self-pay

## 2020-07-10 VITALS — BP 157/89 | HR 92 | Ht 64.0 in | Wt 185.0 lb

## 2020-07-10 DIAGNOSIS — G20 Parkinson's disease: Secondary | ICD-10-CM

## 2020-07-10 NOTE — Progress Notes (Addendum)
Established Patient Office Visit  Subjective:  Patient ID: Paige Moore, female    DOB: Sep 09, 1945  Age: 75 y.o. MRN: [id number]  CC:  Chief Complaint  Patient presents with  . Tremors    Pt is doing better since added midday dosage of Sinemet 25-100    HPI Paige Moore presents for follow up Parkinson's disease.  We increased her levodopa carbidopa in December.  She felt like it was helpful but I really could not see the results when I saw her in March because she had run out of her medication for couple days.  She was still getting frequent freezing throughout the day so we decided to add a short acting Sinemet around 2 PM in the afternoon as she normally takes her first dose of medication around 9:51 AM.  Today is her 1 month follow-up to see if she is doing well on this regimen.     She is doing really well on the regimen.  She feels like she is not sleeping well.  No recent falls. She uses her walker all of the time.  Reports occ days of urinary frequency.     Past Medical History:  Diagnosis Date  . Essential tremor   . Hyperlipidemia   . OA (osteoarthritis)   . PONV (postoperative nausea and vomiting)   . Prediabetes   . VIN III (vulvar intraepithelial neoplasia III)   . Wears dentures    upper  . Wears glasses     Past Surgical History:  Procedure Laterality Date  . CESAREAN SECTION     X 2  . Corneal topography  10/09/2018  . KNEE ARTHROSCOPY Left   . ORIF RIGHT ANKLE FX  1987   later hardware removed  . PLANTAR FASCIA RELEASE  12-02-2003   RIGHT HEEL  . TOTAL ABDOMINAL HYSTERECTOMY  age 49  . TOTAL HIP ARTHROPLASTY Left 7/ 2011    [facility]  . TOTAL KNEE ARTHROPLASTY Left 07-11-2010   [facility]  . VULVECTOMY Left 10/11/2013   Procedure: WIDE LOCAL EXCISON OF LEFT VULVA;  Surgeon: Everitt Amber, MD;  Location: [place];  Service: Gynecology;  Laterality: Left;  . WRIST GANGLION EXCISION Left 2001    Family History  Problem Relation Age of Onset   . Diabetes Father   . Cancer Sister        endometrial cancer  . Osteoporosis Mother   . Hypothyroidism Mother     Social History   Socioeconomic History  . Marital status: Widowed    Spouse name: Not on file  . Number of children: 2  . Years of education: 8  . Highest education level: 8th grade  Occupational History  . Occupation: [employer]    Comment: retired  Tobacco Use  . Smoking status: Former Smoker    Packs/day: 0.25    Years: 15.00    Pack years: 3.75    Types: Cigarettes    Quit date: 03/15/2006    Years since quitting: 14.3  . Smokeless tobacco: Never Used  Vaping Use  . Vaping Use: Never used  Substance and Sexual Activity  . Alcohol use: No  . Drug use: No  . Sexual activity: Not Currently  Other Topics Concern  . 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 Likes to walk in her neighborhood. Drinks a lot of water daiily   Social Determinants of Health   Financial Resource Strain: Not  on file  Food Insecurity: Not on file  Transportation Needs: Not on file  Physical Activity: Not on file  Stress: Not on file  Social Connections: Not on file  Intimate Partner Violence: Not on file    Outpatient Medications Prior to Visit  Medication Sig Dispense Refill  . acetaminophen (TYLENOL) 500 MG tablet Take by mouth. 1-2 tabs po tid prn    . atorvastatin (LIPITOR) 40 MG tablet Take 1 tablet (40 mg total) by mouth daily. 90 tablet 3  . Blood Glucose Monitoring Suppl (ONE TOUCH ULTRA SYSTEM KIT) W/DEVICE KIT 1 kit by Does not apply route once. 1 each 0  . carbidopa-levodopa (SINEMET CR) 50-200 MG tablet Take 1 tablet by mouth 2 (two) times daily. 180 tablet 1  . carbidopa-levodopa (SINEMET) 25-100 MG tablet Take 1 tablet by mouth daily at 12 noon. 90 tablet 1  . glucose blood (ONE TOUCH ULTRA TEST) test strip USE 1 STRIP TO CHECK GLUCOSE ONCE DAILY 100 each 5  . ibuprofen (ADVIL,MOTRIN) 200 MG tablet Take 200  mg by mouth every 6 (six) hours as needed.    . metFORMIN (GLUCOPHAGE) 500 MG tablet Take 1 tablet by mouth once daily with breakfast 90 tablet 0  . Omega-3 1000 MG CAPS Take by mouth.    Glory Rosebush DELICA LANCETS 43X MISC USE 1  TO CHECK GLUCOSE ONCE DAILY 100 each 5  . docusate sodium (COLACE) 100 MG capsule Take 1 capsule (100 mg total) by mouth 2 (two) times daily. 20 capsule 0   No facility-administered medications prior to visit.    Allergies  Allergen Reactions  . Ropinirole Hcl Other (See Comments)    Left foot swelling.  Other reaction(s): Other Left foot swelling.     ROS Review of Systems    Objective:    Physical Exam Constitutional:      Appearance: She is well-developed.  HENT:     Head: Normocephalic and atraumatic.  Cardiovascular:     Rate and Rhythm: Normal rate and regular rhythm.     Heart sounds: Normal heart sounds.  Pulmonary:     Effort: Pulmonary effort is normal.     Breath sounds: Normal breath sounds.  Skin:    General: Skin is warm and dry.  Neurological:     Mental Status: She is alert and oriented to person, place, and time.  Psychiatric:        Behavior: Behavior normal.     BP (!) 157/89   Pulse 92   Ht '5\' 4"'  (1.626 m)   Wt 185 lb (83.9 kg)   SpO2 99%   BMI 31.76 kg/m  Wt Readings from Last 3 Encounters:  07/10/20 185 lb (83.9 kg)  06/03/20 184 lb (83.5 kg)  01/23/20 185 lb (83.9 kg)     There are no preventive care reminders to display for this patient.  There are no preventive care reminders to display for this patient.  Lab Results  Component Value Date   TSH 2.04 06/03/2020   Lab Results  Component Value Date   WBC 5.6 07/03/2012   HGB 14.9 02/02/2016   HCT 43.0 10/11/2013   MCV 90.1 07/03/2012   PLT 170 07/03/2012   Lab Results  Component Value Date   NA 142 06/03/2020   K 4.4 06/03/2020   CO2 29 06/03/2020   GLUCOSE 100 (H) 06/03/2020   BUN 19 06/03/2020   CREATININE 0.73 06/03/2020   BILITOT 1.0  06/03/2020   ALKPHOS 61 02/02/2016  AST 14 06/03/2020   ALT 14 06/03/2020   PROT 7.5 06/03/2020   ALBUMIN 4.4 02/02/2016   CALCIUM 9.8 06/03/2020   Lab Results  Component Value Date   CHOL 120 06/03/2020   Lab Results  Component Value Date   HDL 60 06/03/2020   Lab Results  Component Value Date   LDLCALC 44 06/03/2020   Lab Results  Component Value Date   TRIG 82 06/03/2020   Lab Results  Component Value Date   CHOLHDL 2.0 06/03/2020   Lab Results  Component Value Date   HGBA1C 6.1 (A) 06/03/2020      Assessment & Plan:   Problem List Items Addressed This Visit      Nervous and Auditory   Parkinson disease ([place]) - Primary    She is doing great!!  She is super happy with the addition of the afternoon dose.  Continue current regimen.           No orders of the defined types were placed in this encounter.   Follow-up: Return in about 2 months (around 09/09/2020) for Diabetes follow-up and Parkinsons.    Beatrice Lecher, MD

## 2020-07-10 NOTE — Assessment & Plan Note (Signed)
She is doing great!!  She is super happy with the addition of the afternoon dose.  Continue current regimen.

## 2020-08-19 ENCOUNTER — Encounter: Payer: Self-pay | Admitting: Family Medicine

## 2020-09-23 ENCOUNTER — Other Ambulatory Visit: Payer: Self-pay

## 2020-09-23 ENCOUNTER — Encounter: Payer: Self-pay | Admitting: Family Medicine

## 2020-09-23 ENCOUNTER — Ambulatory Visit (INDEPENDENT_AMBULATORY_CARE_PROVIDER_SITE_OTHER): Payer: Medicare Other | Admitting: Family Medicine

## 2020-09-23 VITALS — BP 132/78 | HR 102 | Ht 64.0 in | Wt 191.0 lb

## 2020-09-23 DIAGNOSIS — R2681 Unsteadiness on feet: Secondary | ICD-10-CM

## 2020-09-23 DIAGNOSIS — E118 Type 2 diabetes mellitus with unspecified complications: Secondary | ICD-10-CM

## 2020-09-23 DIAGNOSIS — G20A1 Parkinson's disease without dyskinesia, without mention of fluctuations: Secondary | ICD-10-CM

## 2020-09-23 DIAGNOSIS — G20 Parkinson's disease: Secondary | ICD-10-CM

## 2020-09-23 DIAGNOSIS — Z6832 Body mass index (BMI) 32.0-32.9, adult: Secondary | ICD-10-CM | POA: Diagnosis not present

## 2020-09-23 LAB — POCT GLYCOSYLATED HEMOGLOBIN (HGB A1C): Hemoglobin A1C: 6 % — AB (ref 4.0–5.6)

## 2020-09-23 MED ORDER — METFORMIN HCL 500 MG PO TABS: 500.0000 mg | ORAL_TABLET | Freq: Every day | ORAL | 1 refills | Status: DC

## 2020-09-23 NOTE — Assessment & Plan Note (Signed)
We did discuss getting back on track and really decreasing intake of carbs and things like chips.

## 2020-09-23 NOTE — Assessment & Plan Note (Signed)
A1c looks great today at 6.0.  Continue with metformin.  She continue with daily statin.  She is not currently on an ACE inhibitor, but blood pressure looks good and she has no renal impairment so we will continue to monitor carefully.  Continue to follow renal function every 6 months.

## 2020-09-23 NOTE — Progress Notes (Addendum)
Established Patient Office Visit  Subjective:  Patient ID: Paige Moore, female    DOB: 12-30-1945  Age: 75 y.o. MRN: [id number]  CC:  Chief Complaint  Patient presents with   Diabetes   Tremors    HPI Paige Moore presents for Diabetes - no hypoglycemic events. No wounds or sores that are not healing well. No increased thirst or urination. Checking glucose at home. Taking medications as prescribed without any side effects.  F/U parkinsons.  Overall she is doing okay on her carbidopa levodopa.  She takes extended release twice a day and a midday dose.  She is fairly happy with her regimen.  She does get some occasional freezing but says that it is rare.  Its been a couple of months since it happened.  She reports that she has been eating Atkins bars daily and has been eating chips even though she has been try to portion control at and knows that she has also gained some weight.  She is actually up about 6 pounds since she was here 2 months ago.  Past Medical History:  Diagnosis Date   Essential tremor    Hyperlipidemia    OA (osteoarthritis)    PONV (postoperative nausea and vomiting)    Prediabetes    VIN III (vulvar intraepithelial neoplasia III)    Wears dentures    upper   Wears glasses     Past Surgical History:  Procedure Laterality Date   CESAREAN SECTION     X 2   Corneal topography  10/09/2018   KNEE ARTHROSCOPY Left    ORIF RIGHT ANKLE FX  1987   later hardware removed   PLANTAR FASCIA RELEASE  12-02-2003   RIGHT HEEL   TOTAL ABDOMINAL HYSTERECTOMY  age 36   TOTAL HIP ARTHROPLASTY Left 7/ 2011    [facility]   TOTAL KNEE ARTHROPLASTY Left 07-11-2010   [facility]   VULVECTOMY Left 10/11/2013   Procedure: WIDE LOCAL EXCISON OF LEFT VULVA;  Surgeon: Everitt Amber, MD;  Location: [place];  Service: Gynecology;  Laterality: Left;   WRIST GANGLION EXCISION Left 2001    Family History  Problem Relation Age of Onset   Diabetes Father    Cancer Sister         endometrial cancer   Osteoporosis Mother    Hypothyroidism Mother     Social History   Socioeconomic History   Marital status: Widowed    Spouse name: Not on file   Number of children: 2   Years of education: 8   Highest education level: 8th grade  Occupational History   Occupation: [employer]    Comment: retired  Tobacco Use   Smoking status: Former    Packs/day: 0.25    Years: 15.00    Pack years: 3.75    Types: Cigarettes    Quit date: 03/15/2006    Years since quitting: 14.5   Smokeless tobacco: Never  Vaping Use   Vaping Use: Never used  Substance and Sexual Activity   Alcohol use: No   Drug use: No   Sexual activity: Not Currently  Other Topics Concern   Not on file  Social History Narrative   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 Likes to walk in her neighborhood. Drinks a lot of water daiily   Social Determinants of Health   Financial Resource Strain: Not on file  Food Insecurity: Not on file  Transportation Needs: Not on file  Physical Activity:  Not on file  Stress: Not on file  Social Connections: Not on file  Intimate Partner Violence: Not on file    Outpatient Medications Prior to Visit  Medication Sig Dispense Refill   acetaminophen (TYLENOL) 500 MG tablet Take by mouth. 1-2 tabs po tid prn     atorvastatin (LIPITOR) 40 MG tablet Take 1 tablet (40 mg total) by mouth daily. 90 tablet 3   Blood Glucose Monitoring Suppl (ONE TOUCH ULTRA SYSTEM KIT) W/DEVICE KIT 1 kit by Does not apply route once. 1 each 0   carbidopa-levodopa (SINEMET CR) 50-200 MG tablet Take 1 tablet by mouth 2 (two) times daily. 180 tablet 1   carbidopa-levodopa (SINEMET) 25-100 MG tablet Take 1 tablet by mouth daily at 12 noon. 90 tablet 1   glucose blood (ONE TOUCH ULTRA TEST) test strip USE 1 STRIP TO CHECK GLUCOSE ONCE DAILY 100 each 5   ibuprofen (ADVIL,MOTRIN) 200 MG tablet Take 200 mg by mouth every 6 (six) hours as needed.     Omega-3 1000 MG CAPS Take  by mouth.     ONETOUCH DELICA LANCETS 89H MISC USE 1  TO CHECK GLUCOSE ONCE DAILY 100 each 5   metFORMIN (GLUCOPHAGE) 500 MG tablet Take 1 tablet by mouth once daily with breakfast 90 tablet 0   No facility-administered medications prior to visit.    Allergies  Allergen Reactions   Ropinirole Hcl Other (See Comments)    Left foot swelling.  Other reaction(s): Other Left foot swelling.     ROS Review of Systems    Objective:    Physical Exam Constitutional:      Appearance: Normal appearance. She is well-developed.  HENT:     Head: Normocephalic and atraumatic.  Cardiovascular:     Rate and Rhythm: Normal rate and regular rhythm.     Heart sounds: Normal heart sounds.  Pulmonary:     Effort: Pulmonary effort is normal.     Breath sounds: Normal breath sounds.  Skin:    General: Skin is warm and dry.  Neurological:     Mental Status: She is alert and oriented to person, place, and time.  Psychiatric:        Behavior: Behavior normal.    BP 132/78   Pulse (!) 102   Ht _0  (1.626 m)   Wt 191 lb (86.6 kg)   SpO2 97%   BMI 32.79 kg/m  Wt Readings from Last 3 Encounters:  09/23/20 191 lb (86.6 kg)  07/10/20 185 lb (83.9 kg)  06/03/20 184 lb (83.5 kg)     Health Maintenance Due  Topic Date Due   Zoster Vaccines- Shingrix (1 of 2) Never done   COVID-19 Vaccine (4 - Booster for Pfizer series) 04/24/2020    There are no preventive care reminders to display for this patient.  Lab Results  Component Value Date   TSH 2.04 06/03/2020   Lab Results  Component Value Date   WBC 5.6 07/03/2012   HGB 14.9 02/02/2016   HCT 43.0 10/11/2013   MCV 90.1 07/03/2012   PLT 170 07/03/2012   Lab Results  Component Value Date   NA 142 06/03/2020   K 4.4 06/03/2020   CO2 29 06/03/2020   GLUCOSE 100 (H) 06/03/2020   BUN 19 06/03/2020   CREATININE 0.73 06/03/2020   BILITOT 1.0 06/03/2020   ALKPHOS 61 02/02/2016   AST 14 06/03/2020   ALT 14 06/03/2020   PROT 7.5  06/03/2020   ALBUMIN 4.4 02/02/2016  CALCIUM 9.8 06/03/2020   Lab Results  Component Value Date   CHOL 120 06/03/2020   Lab Results  Component Value Date   HDL 60 06/03/2020   Lab Results  Component Value Date   LDLCALC 44 06/03/2020   Lab Results  Component Value Date   TRIG 82 06/03/2020   Lab Results  Component Value Date   CHOLHDL 2.0 06/03/2020   Lab Results  Component Value Date   HGBA1C 6.0 (A) 09/23/2020      Assessment & Plan:   Problem List Items Addressed This Visit       Endocrine   Controlled diabetes mellitus type 2 with complications ([place]) - Primary    A1c looks great today at 6.0.  Continue with metformin.  She continue with daily statin.  She is not currently on an ACE inhibitor, but blood pressure looks good and she has no renal impairment so we will continue to monitor carefully.  Continue to follow renal function every 6 months.          Relevant Medications   metFORMIN (GLUCOPHAGE) 500 MG tablet   Other Relevant Orders   POCT glycosylated hemoglobin (Hb A1C) (Completed)     Nervous and Auditory   Parkinson disease ([place])    Stable on current regimen though personally I did feel like her tremor was a little worse today.  We discussed referral to neurology but she declined.  She just wants to continue with what she is currently taking she does use her walker constantly now.  And she is able to do most tasks that she would like to do but says sometimes she has to alter how she completes them.         Other   Gait instability    Uses a rolling walker regularly.       BMI 32.0-32.9,adult    We did discuss getting back on track and really decreasing intake of carbs and things like chips.        Meds ordered this encounter  Medications   metFORMIN (GLUCOPHAGE) 500 MG tablet    Sig: Take 1 tablet (500 mg total) by mouth daily with breakfast.    Dispense:  90 tablet    Refill:  1     Follow-up: Return in about 6 months (around  03/26/2021) for Diabetes follow-up, Parkinsons.    Beatrice Lecher, MD

## 2020-09-23 NOTE — Assessment & Plan Note (Signed)
Uses a rolling walker regularly.

## 2020-09-23 NOTE — Assessment & Plan Note (Signed)
Stable on current regimen though personally I did feel like her tremor was a little worse today.  We discussed referral to neurology but she declined.  She just wants to continue with what she is currently taking she does use her walker constantly now.  And she is able to do most tasks that she would like to do but says sometimes she has to alter how she completes them.

## 2020-11-19 ENCOUNTER — Other Ambulatory Visit: Payer: Self-pay | Admitting: Family Medicine

## 2020-11-19 DIAGNOSIS — G20 Parkinson's disease: Secondary | ICD-10-CM

## 2021-02-15 ENCOUNTER — Other Ambulatory Visit: Payer: Self-pay | Admitting: Family Medicine

## 2021-02-15 DIAGNOSIS — G20 Parkinson's disease: Secondary | ICD-10-CM

## 2021-02-19 ENCOUNTER — Other Ambulatory Visit: Payer: Self-pay | Admitting: Family Medicine

## 2021-02-19 DIAGNOSIS — G20 Parkinson's disease: Secondary | ICD-10-CM

## 2021-03-26 ENCOUNTER — Encounter: Payer: Self-pay | Admitting: Family Medicine

## 2021-03-26 ENCOUNTER — Other Ambulatory Visit: Payer: Self-pay

## 2021-03-26 ENCOUNTER — Ambulatory Visit (INDEPENDENT_AMBULATORY_CARE_PROVIDER_SITE_OTHER): Payer: Commercial Managed Care - HMO | Admitting: Family Medicine

## 2021-03-26 VITALS — BP 127/99 | HR 92 | Temp 98.4°F | Resp 18 | Ht 64.0 in | Wt 190.0 lb

## 2021-03-26 DIAGNOSIS — Z79899 Other long term (current) drug therapy: Secondary | ICD-10-CM | POA: Diagnosis not present

## 2021-03-26 DIAGNOSIS — E118 Type 2 diabetes mellitus with unspecified complications: Secondary | ICD-10-CM | POA: Diagnosis not present

## 2021-03-26 DIAGNOSIS — G20 Parkinson's disease: Secondary | ICD-10-CM

## 2021-03-26 DIAGNOSIS — Z23 Encounter for immunization: Secondary | ICD-10-CM | POA: Diagnosis not present

## 2021-03-26 DIAGNOSIS — E785 Hyperlipidemia, unspecified: Secondary | ICD-10-CM | POA: Diagnosis not present

## 2021-03-26 DIAGNOSIS — G20A1 Parkinson's disease without dyskinesia, without mention of fluctuations: Secondary | ICD-10-CM

## 2021-03-26 DIAGNOSIS — E1169 Type 2 diabetes mellitus with other specified complication: Secondary | ICD-10-CM | POA: Diagnosis not present

## 2021-03-26 LAB — CBC
HCT: 41 % (ref 35.0–45.0)
Hemoglobin: 13.8 g/dL (ref 11.7–15.5)
MCH: 31.2 pg (ref 27.0–33.0)
MCHC: 33.7 g/dL (ref 32.0–36.0)
MCV: 92.6 fL (ref 80.0–100.0)
MPV: 11.5 fL (ref 7.5–12.5)
Platelets: 181 10*3/uL (ref 140–400)
RBC: 4.43 10*6/uL (ref 3.80–5.10)
RDW: 12.4 % (ref 11.0–15.0)
WBC: 5.7 10*3/uL (ref 3.8–10.8)

## 2021-03-26 LAB — BASIC METABOLIC PANEL WITH GFR
BUN: 18 mg/dL (ref 7–25)
CO2: 27 mmol/L (ref 20–32)
Calcium: 9.5 mg/dL (ref 8.6–10.4)
Chloride: 107 mmol/L (ref 98–110)
Creat: 0.74 mg/dL (ref 0.60–1.00)
Glucose, Bld: 82 mg/dL (ref 65–99)
Potassium: 4.2 mmol/L (ref 3.5–5.3)
Sodium: 142 mmol/L (ref 135–146)
eGFR: 84 mL/min/{1.73_m2} (ref >=60)

## 2021-03-26 LAB — VITAMIN B12: Vitamin B-12: 288 pg/mL (ref 200–1100)

## 2021-03-26 LAB — POCT GLYCOSYLATED HEMOGLOBIN (HGB A1C): Hemoglobin A1C: 5.9 % — AB (ref 4.0–5.6)

## 2021-03-26 MED ORDER — ATORVASTATIN CALCIUM 40 MG PO TABS: 40.0000 mg | ORAL_TABLET | Freq: Every day | ORAL | 3 refills | Status: DC

## 2021-03-26 MED ORDER — METFORMIN HCL 500 MG PO TABS: 500.0000 mg | ORAL_TABLET | Freq: Every day | ORAL | 1 refills | Status: DC

## 2021-03-26 NOTE — Assessment & Plan Note (Signed)
Stable and happy with current regimen.  Follow-up in 6 months.  We will make sure that refills have been sent to the pharmacy.

## 2021-03-26 NOTE — Assessment & Plan Note (Signed)
Continue atorvastatin.  Lipid panel is up-to-date.

## 2021-03-26 NOTE — Progress Notes (Signed)
Patient declined to schedule Colonoscopy.  Patient stated she will log her BP readings for 2 weeks at home and will call our office in 2 weeks to report her BP readings. Patient stated this is easier for her to do than to come in due to transportation issues.

## 2021-03-26 NOTE — Progress Notes (Signed)
Established Patient Office Visit  Subjective:  Patient ID: Paige Moore, female    DOB: 09-24-1945  Age: 76 y.o. MRN: [id number]  CC:  Chief Complaint  Patient presents with   Diabetes    Follow up    HPI Paige Moore presents for   Diabetes - no hypoglycemic events. No wounds or sores that are not healing well. No increased thirst or urination. Checking glucose at home. Taking medications as prescribed without any side effects.  Last A1c was 6.06 months ago.  Parkinson's-states extended release carbidopa levodopa twice a day and then a midday dose.  She still has some occasional freezing but says it is pretty rare.  She says she is really happy with her dosing regimen and does not really want to make any changes today.  She is also not interested in a neurology referral.  Past Medical History:  Diagnosis Date   Essential tremor    Hyperlipidemia    OA (osteoarthritis)    PONV (postoperative nausea and vomiting)    Prediabetes    VIN III (vulvar intraepithelial neoplasia III)    Wears dentures    upper   Wears glasses     Past Surgical History:  Procedure Laterality Date   CESAREAN SECTION     X 2   Corneal topography  10/09/2018   KNEE ARTHROSCOPY Left    ORIF RIGHT ANKLE FX  1987   later hardware removed   PLANTAR FASCIA RELEASE  12-02-2003   RIGHT HEEL   TOTAL ABDOMINAL HYSTERECTOMY  age 1   TOTAL HIP ARTHROPLASTY Left 7/ 2011    [facility]   TOTAL KNEE ARTHROPLASTY Left 07-11-2010   [facility]   VULVECTOMY Left 10/11/2013   Procedure: WIDE LOCAL EXCISON OF LEFT VULVA;  Surgeon: Everitt Amber, MD;  Location: [place];  Service: Gynecology;  Laterality: Left;   WRIST GANGLION EXCISION Left 2001    Family History  Problem Relation Age of Onset   Diabetes Father    Cancer Sister        endometrial cancer   Osteoporosis Mother    Hypothyroidism Mother     Social History   Socioeconomic History   Marital status: Widowed    Spouse name: Not on  file   Number of children: 2   Years of education: 8   Highest education level: 8th grade  Occupational History   Occupation: [employer]    Comment: retired  Tobacco Use   Smoking status: Former    Packs/day: 0.25    Years: 15.00    Pack years: 3.75    Types: Cigarettes    Quit date: 03/15/2006    Years since quitting: 15.0   Smokeless tobacco: Never  Vaping Use   Vaping Use: Never used  Substance and Sexual Activity   Alcohol use: No   Drug use: No   Sexual activity: Not Currently  Other Topics Concern   Not on file  Social History Narrative   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 Likes to walk in her neighborhood. Drinks a lot of water daiily   Social Determinants of Health   Financial Resource Strain: Not on file  Food Insecurity: Not on file  Transportation Needs: Not on file  Physical Activity: Not on file  Stress: Not on file  Social Connections: Not on file  Intimate Partner Violence: Not on file    Outpatient Medications Prior to Visit  Medication Sig Dispense Refill   acetaminophen (  TYLENOL) 500 MG tablet Take by mouth. 1-2 tabs po tid prn     Blood Glucose Monitoring Suppl (ONE TOUCH ULTRA SYSTEM KIT) W/DEVICE KIT 1 kit by Does not apply route once. 1 each 0   carbidopa-levodopa (SINEMET CR) 50-200 MG tablet Take 1 tablet by mouth twice daily 180 tablet 0   carbidopa-levodopa (SINEMET IR) 25-100 MG tablet TAKE 1 TABLET BY MOUTH ONCE DAILY AT NOON 90 tablet 0   glucose blood (ONE TOUCH ULTRA TEST) test strip USE 1 STRIP TO CHECK GLUCOSE ONCE DAILY 100 each 5   ibuprofen (ADVIL,MOTRIN) 200 MG tablet Take 200 mg by mouth every 6 (six) hours as needed.     atorvastatin (LIPITOR) 40 MG tablet Take 1 tablet (40 mg total) by mouth daily. 90 tablet 3   metFORMIN (GLUCOPHAGE) 500 MG tablet Take 1 tablet (500 mg total) by mouth daily with breakfast. 90 tablet 1   Omega-3 1000 MG CAPS Take by mouth.     ONETOUCH DELICA LANCETS 24M MISC USE 1  TO CHECK  GLUCOSE ONCE DAILY 100 each 5   No facility-administered medications prior to visit.    Allergies  Allergen Reactions   Ropinirole Hcl Other (See Comments)    Left foot swelling.  Other reaction(s): Other Left foot swelling.     ROS Review of Systems    Objective:    Physical Exam  BP (!) 127/99    Pulse 92    Temp 98.4 F (36.9 C)    Resp 18    Ht _0  (1.626 m)    Wt 190 lb (86.2 kg)    SpO2 98%    BMI 32.61 kg/m  Wt Readings from Last 3 Encounters:  03/26/21 190 lb (86.2 kg)  09/23/20 191 lb (86.6 kg)  07/10/20 185 lb (83.9 kg)     Health Maintenance Due  Topic Date Due   COVID-19 Vaccine (4 - Booster for Pfizer series) 03/19/2020    There are no preventive care reminders to display for this patient.  Lab Results  Component Value Date   TSH 2.04 06/03/2020   Lab Results  Component Value Date   WBC 5.6 07/03/2012   HGB 14.9 02/02/2016   HCT 43.0 10/11/2013   MCV 90.1 07/03/2012   PLT 170 07/03/2012   Lab Results  Component Value Date   NA 142 06/03/2020   K 4.4 06/03/2020   CO2 29 06/03/2020   GLUCOSE 100 (H) 06/03/2020   BUN 19 06/03/2020   CREATININE 0.73 06/03/2020   BILITOT 1.0 06/03/2020   ALKPHOS 61 02/02/2016   AST 14 06/03/2020   ALT 14 06/03/2020   PROT 7.5 06/03/2020   ALBUMIN 4.4 02/02/2016   CALCIUM 9.8 06/03/2020   Lab Results  Component Value Date   CHOL 120 06/03/2020   Lab Results  Component Value Date   HDL 60 06/03/2020   Lab Results  Component Value Date   LDLCALC 44 06/03/2020   Lab Results  Component Value Date   TRIG 82 06/03/2020   Lab Results  Component Value Date   CHOLHDL 2.0 06/03/2020   Lab Results  Component Value Date   HGBA1C 5.9 (A) 03/26/2021      Assessment & Plan:   Problem List Items Addressed This Visit       Endocrine   Hyperlipidemia associated with type 2 diabetes mellitus ([place])    Continue atorvastatin.  Lipid panel is up-to-date.      Relevant Medications   atorvastatin  (  LIPITOR) 40 MG tablet   metFORMIN (GLUCOPHAGE) 500 MG tablet   Controlled diabetes mellitus type 2 with complications ([facility]) - Primary    A1c looks well controlled at 5.9.  Continue current regimen.  She is really doing fantastic.  Follow-up in 6 months.      Relevant Medications   atorvastatin (LIPITOR) 40 MG tablet   metFORMIN (GLUCOPHAGE) 500 MG tablet   Other Relevant Orders   BASIC METABOLIC PANEL WITH GFR   B12   CBC   POCT HgB A1C (Completed)     Nervous and Auditory   Parkinson disease ([place])    Stable and happy with current regimen.  Follow-up in 6 months.  We will make sure that refills have been sent to the pharmacy.      Other Visit Diagnoses     Medication management       Relevant Orders   BASIC METABOLIC PANEL WITH GFR   B12   CBC   Need for immunization against influenza       Relevant Orders   Flu Vaccine QUAD High Dose(Fluad) (Completed)       Flu vaccine given today.  Meds ordered this encounter  Medications   atorvastatin (LIPITOR) 40 MG tablet    Sig: Take 1 tablet (40 mg total) by mouth daily.    Dispense:  90 tablet    Refill:  3   metFORMIN (GLUCOPHAGE) 500 MG tablet    Sig: Take 1 tablet (500 mg total) by mouth daily with breakfast.    Dispense:  90 tablet    Refill:  1    Follow-up: Return in about 6 months (around 09/23/2021) for Diabetes follow-up, Parkinsons  .    Beatrice Lecher, MD

## 2021-03-26 NOTE — Assessment & Plan Note (Signed)
A1c looks well controlled at 5.9.  Continue current regimen.  She is really doing fantastic.  Follow-up in 6 months.

## 2021-03-27 NOTE — Progress Notes (Signed)
Call patient: Metabolic panel looks good.  B12 is normal but it is trending downward.  So consider taking a multivitamin that has B12 in it.  Blood count is normal.  No sign of anemia.

## 2021-05-12 ENCOUNTER — Other Ambulatory Visit: Payer: Self-pay

## 2021-05-12 DIAGNOSIS — E2839 Other primary ovarian failure: Secondary | ICD-10-CM

## 2021-05-16 ENCOUNTER — Other Ambulatory Visit: Payer: Self-pay | Admitting: Family Medicine

## 2021-05-16 DIAGNOSIS — G20 Parkinson's disease: Secondary | ICD-10-CM

## 2021-08-09 ENCOUNTER — Other Ambulatory Visit: Payer: Self-pay | Admitting: Family Medicine

## 2021-08-09 DIAGNOSIS — G20 Parkinson's disease: Secondary | ICD-10-CM

## 2021-08-25 ENCOUNTER — Ambulatory Visit: Payer: Commercial Managed Care - HMO | Admitting: Family Medicine

## 2021-09-02 ENCOUNTER — Telehealth: Payer: Self-pay | Admitting: Family Medicine

## 2021-09-02 DIAGNOSIS — R2681 Unsteadiness on feet: Secondary | ICD-10-CM

## 2021-09-02 DIAGNOSIS — G20 Parkinson's disease: Secondary | ICD-10-CM

## 2021-09-02 NOTE — Telephone Encounter (Signed)
Spoke w/pt's sister she stated that she has notice that she has been "freezing", stumbling, and unable to comprehend, she's afraid to go to far out of the house because of this.   She was wanting to know what Dr. Madilyn Fireman thought they should do about this.   She was advised that today was Dr. Gardiner Ramus first day back however, she did overhear the conversation and said that it would be best for the patient to be seen by neurology. Her sister stated that she felt like that would be fine she asked if the referral could be local [facility]). She said that if we needed to speak with Vaughan Basta we can call her or we could also call her back.  Will route to pcp.  Referral pended

## 2021-09-03 NOTE — Telephone Encounter (Signed)
Pt called and informed her of recommendations she voiced understanding.

## 2021-09-03 NOTE — Telephone Encounter (Signed)
It sounds like her Parkinson's is getting much worse and is not well controlled on the Sinemet CR.  So would like to get her in for referral with neurology.  We will try to get her in locally here.  Please let us know though if for some reason they are booked out and you cannot get in in the next several weeks.

## 2021-09-16 DIAGNOSIS — G20 Parkinson's disease: Secondary | ICD-10-CM | POA: Diagnosis not present

## 2021-09-16 DIAGNOSIS — E118 Type 2 diabetes mellitus with unspecified complications: Secondary | ICD-10-CM | POA: Diagnosis not present

## 2021-09-22 ENCOUNTER — Ambulatory Visit: Payer: Commercial Managed Care - HMO | Admitting: Family Medicine

## 2021-09-23 ENCOUNTER — Other Ambulatory Visit: Payer: Commercial Managed Care - HMO

## 2021-09-23 ENCOUNTER — Ambulatory Visit: Payer: Commercial Managed Care - HMO | Admitting: Family Medicine

## 2021-10-08 ENCOUNTER — Other Ambulatory Visit: Payer: Self-pay | Admitting: Family Medicine

## 2021-10-08 DIAGNOSIS — G20 Parkinson's disease: Secondary | ICD-10-CM

## 2021-10-08 DIAGNOSIS — E118 Type 2 diabetes mellitus with unspecified complications: Secondary | ICD-10-CM

## 2021-10-13 ENCOUNTER — Telehealth: Payer: Self-pay | Admitting: *Deleted

## 2021-10-13 ENCOUNTER — Other Ambulatory Visit: Payer: Self-pay | Admitting: *Deleted

## 2021-10-13 DIAGNOSIS — E785 Hyperlipidemia, unspecified: Secondary | ICD-10-CM | POA: Diagnosis not present

## 2021-10-13 DIAGNOSIS — E119 Type 2 diabetes mellitus without complications: Secondary | ICD-10-CM | POA: Diagnosis not present

## 2021-10-13 DIAGNOSIS — G20 Parkinson's disease: Secondary | ICD-10-CM

## 2021-10-13 DIAGNOSIS — Z8589 Personal history of malignant neoplasm of other organs and systems: Secondary | ICD-10-CM | POA: Diagnosis not present

## 2021-10-13 DIAGNOSIS — G9389 Other specified disorders of brain: Secondary | ICD-10-CM | POA: Diagnosis not present

## 2021-10-13 DIAGNOSIS — E559 Vitamin D deficiency, unspecified: Secondary | ICD-10-CM | POA: Diagnosis not present

## 2021-10-13 DIAGNOSIS — R0602 Shortness of breath: Secondary | ICD-10-CM | POA: Diagnosis not present

## 2021-10-13 DIAGNOSIS — Z96652 Presence of left artificial knee joint: Secondary | ICD-10-CM | POA: Diagnosis not present

## 2021-10-13 DIAGNOSIS — K449 Diaphragmatic hernia without obstruction or gangrene: Secondary | ICD-10-CM | POA: Diagnosis not present

## 2021-10-13 DIAGNOSIS — M1990 Unspecified osteoarthritis, unspecified site: Secondary | ICD-10-CM | POA: Diagnosis not present

## 2021-10-13 DIAGNOSIS — J8410 Pulmonary fibrosis, unspecified: Secondary | ICD-10-CM | POA: Diagnosis not present

## 2021-10-13 DIAGNOSIS — E139 Other specified diabetes mellitus without complications: Secondary | ICD-10-CM | POA: Diagnosis not present

## 2021-10-13 DIAGNOSIS — N390 Urinary tract infection, site not specified: Secondary | ICD-10-CM | POA: Diagnosis not present

## 2021-10-13 DIAGNOSIS — Z79899 Other long term (current) drug therapy: Secondary | ICD-10-CM | POA: Diagnosis not present

## 2021-10-13 DIAGNOSIS — R079 Chest pain, unspecified: Secondary | ICD-10-CM | POA: Diagnosis not present

## 2021-10-13 DIAGNOSIS — Z888 Allergy status to other drugs, medicaments and biological substances status: Secondary | ICD-10-CM | POA: Diagnosis not present

## 2021-10-13 DIAGNOSIS — Z7984 Long term (current) use of oral hypoglycemic drugs: Secondary | ICD-10-CM | POA: Diagnosis not present

## 2021-10-13 DIAGNOSIS — R9082 White matter disease, unspecified: Secondary | ICD-10-CM | POA: Diagnosis not present

## 2021-10-13 DIAGNOSIS — Z96642 Presence of left artificial hip joint: Secondary | ICD-10-CM | POA: Diagnosis not present

## 2021-10-13 MED ORDER — CARBIDOPA-LEVODOPA 25-100 MG PO TABS: ORAL_TABLET | ORAL | 1 refills | Status: DC

## 2021-10-13 NOTE — Telephone Encounter (Signed)
Pt's sister called stating that she was seen by the Neurologist and he made changes to her medications. She said that Vidalia has been getting worse over the past 2 weeks, she has called their office to inform them of her decline and was told that they were trying to get a therapist out to her home to begin therapy. She said that no one has called them or come out and she has called to inform them of this. She stated that she doesn't understand what the therapist is going to be able to do at this point considering how badly she's declining.   I advised her to take [name] to the ED to get some help since she has done all of the right things to try to get the help that she needs and no one has responded to her about this.   She states that she will take her to [facility] in [place].

## 2021-10-14 DIAGNOSIS — G9389 Other specified disorders of brain: Secondary | ICD-10-CM | POA: Diagnosis not present

## 2021-10-14 DIAGNOSIS — G20 Parkinson's disease: Secondary | ICD-10-CM | POA: Diagnosis not present

## 2021-10-14 DIAGNOSIS — E785 Hyperlipidemia, unspecified: Secondary | ICD-10-CM | POA: Diagnosis not present

## 2021-10-14 DIAGNOSIS — N390 Urinary tract infection, site not specified: Secondary | ICD-10-CM | POA: Diagnosis not present

## 2021-10-14 DIAGNOSIS — E139 Other specified diabetes mellitus without complications: Secondary | ICD-10-CM | POA: Diagnosis not present

## 2021-10-14 DIAGNOSIS — R9082 White matter disease, unspecified: Secondary | ICD-10-CM | POA: Diagnosis not present

## 2021-10-14 DIAGNOSIS — R0602 Shortness of breath: Secondary | ICD-10-CM | POA: Diagnosis not present

## 2021-10-15 DIAGNOSIS — Z79899 Other long term (current) drug therapy: Secondary | ICD-10-CM | POA: Diagnosis not present

## 2021-10-15 DIAGNOSIS — E538 Deficiency of other specified B group vitamins: Secondary | ICD-10-CM | POA: Diagnosis not present

## 2021-10-15 DIAGNOSIS — E119 Type 2 diabetes mellitus without complications: Secondary | ICD-10-CM | POA: Diagnosis not present

## 2021-10-15 DIAGNOSIS — Z8589 Personal history of malignant neoplasm of other organs and systems: Secondary | ICD-10-CM | POA: Diagnosis not present

## 2021-10-15 DIAGNOSIS — R5381 Other malaise: Secondary | ICD-10-CM | POA: Diagnosis not present

## 2021-10-15 DIAGNOSIS — E785 Hyperlipidemia, unspecified: Secondary | ICD-10-CM | POA: Diagnosis not present

## 2021-10-15 DIAGNOSIS — M1990 Unspecified osteoarthritis, unspecified site: Secondary | ICD-10-CM | POA: Diagnosis not present

## 2021-10-15 DIAGNOSIS — E559 Vitamin D deficiency, unspecified: Secondary | ICD-10-CM | POA: Diagnosis not present

## 2021-10-15 DIAGNOSIS — E139 Other specified diabetes mellitus without complications: Secondary | ICD-10-CM | POA: Diagnosis not present

## 2021-10-15 DIAGNOSIS — D519 Vitamin B12 deficiency anemia, unspecified: Secondary | ICD-10-CM | POA: Diagnosis not present

## 2021-10-15 DIAGNOSIS — E1169 Type 2 diabetes mellitus with other specified complication: Secondary | ICD-10-CM | POA: Diagnosis not present

## 2021-10-15 DIAGNOSIS — Z7984 Long term (current) use of oral hypoglycemic drugs: Secondary | ICD-10-CM | POA: Diagnosis not present

## 2021-10-15 DIAGNOSIS — Z96652 Presence of left artificial knee joint: Secondary | ICD-10-CM | POA: Diagnosis not present

## 2021-10-15 DIAGNOSIS — N390 Urinary tract infection, site not specified: Secondary | ICD-10-CM | POA: Diagnosis not present

## 2021-10-15 DIAGNOSIS — Z96642 Presence of left artificial hip joint: Secondary | ICD-10-CM | POA: Diagnosis not present

## 2021-10-15 DIAGNOSIS — Z888 Allergy status to other drugs, medicaments and biological substances status: Secondary | ICD-10-CM | POA: Diagnosis not present

## 2021-10-15 DIAGNOSIS — Z9049 Acquired absence of other specified parts of digestive tract: Secondary | ICD-10-CM | POA: Diagnosis not present

## 2021-10-15 DIAGNOSIS — G20 Parkinson's disease: Secondary | ICD-10-CM | POA: Diagnosis not present

## 2021-10-20 DIAGNOSIS — E538 Deficiency of other specified B group vitamins: Secondary | ICD-10-CM | POA: Diagnosis not present

## 2021-10-20 DIAGNOSIS — E785 Hyperlipidemia, unspecified: Secondary | ICD-10-CM | POA: Diagnosis not present

## 2021-10-20 DIAGNOSIS — E1169 Type 2 diabetes mellitus with other specified complication: Secondary | ICD-10-CM | POA: Diagnosis not present

## 2021-10-20 DIAGNOSIS — M1990 Unspecified osteoarthritis, unspecified site: Secondary | ICD-10-CM | POA: Diagnosis not present

## 2021-10-20 DIAGNOSIS — E559 Vitamin D deficiency, unspecified: Secondary | ICD-10-CM | POA: Diagnosis not present

## 2021-10-20 DIAGNOSIS — G20 Parkinson's disease: Secondary | ICD-10-CM | POA: Diagnosis not present

## 2021-10-22 DIAGNOSIS — E119 Type 2 diabetes mellitus without complications: Secondary | ICD-10-CM | POA: Diagnosis not present

## 2021-10-22 DIAGNOSIS — G20 Parkinson's disease: Secondary | ICD-10-CM | POA: Diagnosis not present

## 2021-10-22 DIAGNOSIS — E785 Hyperlipidemia, unspecified: Secondary | ICD-10-CM | POA: Diagnosis not present

## 2021-10-27 ENCOUNTER — Ambulatory Visit: Payer: Commercial Managed Care - HMO | Admitting: Family Medicine

## 2021-10-28 DIAGNOSIS — R5381 Other malaise: Secondary | ICD-10-CM | POA: Diagnosis not present

## 2021-10-28 DIAGNOSIS — G20 Parkinson's disease: Secondary | ICD-10-CM | POA: Diagnosis not present

## 2021-10-28 DIAGNOSIS — M1990 Unspecified osteoarthritis, unspecified site: Secondary | ICD-10-CM | POA: Diagnosis not present

## 2021-10-28 DIAGNOSIS — E559 Vitamin D deficiency, unspecified: Secondary | ICD-10-CM | POA: Diagnosis not present

## 2021-10-28 DIAGNOSIS — E785 Hyperlipidemia, unspecified: Secondary | ICD-10-CM | POA: Diagnosis not present

## 2021-10-28 DIAGNOSIS — Z7984 Long term (current) use of oral hypoglycemic drugs: Secondary | ICD-10-CM | POA: Diagnosis not present

## 2021-10-28 DIAGNOSIS — E538 Deficiency of other specified B group vitamins: Secondary | ICD-10-CM | POA: Diagnosis not present

## 2021-10-28 DIAGNOSIS — E1169 Type 2 diabetes mellitus with other specified complication: Secondary | ICD-10-CM | POA: Diagnosis not present

## 2021-11-03 DIAGNOSIS — Z96641 Presence of right artificial hip joint: Secondary | ICD-10-CM | POA: Diagnosis not present

## 2021-11-03 DIAGNOSIS — Z9181 History of falling: Secondary | ICD-10-CM | POA: Diagnosis not present

## 2021-11-03 DIAGNOSIS — E785 Hyperlipidemia, unspecified: Secondary | ICD-10-CM | POA: Diagnosis not present

## 2021-11-03 DIAGNOSIS — M1990 Unspecified osteoarthritis, unspecified site: Secondary | ICD-10-CM | POA: Diagnosis not present

## 2021-11-03 DIAGNOSIS — Z7984 Long term (current) use of oral hypoglycemic drugs: Secondary | ICD-10-CM | POA: Diagnosis not present

## 2021-11-03 DIAGNOSIS — Z9049 Acquired absence of other specified parts of digestive tract: Secondary | ICD-10-CM | POA: Diagnosis not present

## 2021-11-03 DIAGNOSIS — G20 Parkinson's disease: Secondary | ICD-10-CM | POA: Diagnosis not present

## 2021-11-03 DIAGNOSIS — R531 Weakness: Secondary | ICD-10-CM | POA: Diagnosis not present

## 2021-11-03 DIAGNOSIS — C259 Malignant neoplasm of pancreas, unspecified: Secondary | ICD-10-CM | POA: Diagnosis not present

## 2021-11-03 DIAGNOSIS — Z96652 Presence of left artificial knee joint: Secondary | ICD-10-CM | POA: Diagnosis not present

## 2021-11-03 DIAGNOSIS — Z87440 Personal history of urinary (tract) infections: Secondary | ICD-10-CM | POA: Diagnosis not present

## 2021-11-03 DIAGNOSIS — E119 Type 2 diabetes mellitus without complications: Secondary | ICD-10-CM | POA: Diagnosis not present

## 2021-11-04 ENCOUNTER — Other Ambulatory Visit: Payer: Self-pay | Admitting: Family Medicine

## 2021-11-04 DIAGNOSIS — E785 Hyperlipidemia, unspecified: Secondary | ICD-10-CM | POA: Diagnosis not present

## 2021-11-04 DIAGNOSIS — R531 Weakness: Secondary | ICD-10-CM | POA: Diagnosis not present

## 2021-11-04 DIAGNOSIS — Z96641 Presence of right artificial hip joint: Secondary | ICD-10-CM | POA: Diagnosis not present

## 2021-11-04 DIAGNOSIS — Z9049 Acquired absence of other specified parts of digestive tract: Secondary | ICD-10-CM | POA: Diagnosis not present

## 2021-11-04 DIAGNOSIS — E118 Type 2 diabetes mellitus with unspecified complications: Secondary | ICD-10-CM

## 2021-11-04 DIAGNOSIS — Z7984 Long term (current) use of oral hypoglycemic drugs: Secondary | ICD-10-CM | POA: Diagnosis not present

## 2021-11-04 DIAGNOSIS — Z87440 Personal history of urinary (tract) infections: Secondary | ICD-10-CM | POA: Diagnosis not present

## 2021-11-04 DIAGNOSIS — E119 Type 2 diabetes mellitus without complications: Secondary | ICD-10-CM | POA: Diagnosis not present

## 2021-11-04 DIAGNOSIS — C259 Malignant neoplasm of pancreas, unspecified: Secondary | ICD-10-CM | POA: Diagnosis not present

## 2021-11-04 DIAGNOSIS — Z9181 History of falling: Secondary | ICD-10-CM | POA: Diagnosis not present

## 2021-11-04 DIAGNOSIS — Z96652 Presence of left artificial knee joint: Secondary | ICD-10-CM | POA: Diagnosis not present

## 2021-11-04 DIAGNOSIS — M1990 Unspecified osteoarthritis, unspecified site: Secondary | ICD-10-CM | POA: Diagnosis not present

## 2021-11-04 DIAGNOSIS — G20 Parkinson's disease: Secondary | ICD-10-CM

## 2021-11-06 DIAGNOSIS — Z87440 Personal history of urinary (tract) infections: Secondary | ICD-10-CM | POA: Diagnosis not present

## 2021-11-06 DIAGNOSIS — R531 Weakness: Secondary | ICD-10-CM | POA: Diagnosis not present

## 2021-11-06 DIAGNOSIS — M1990 Unspecified osteoarthritis, unspecified site: Secondary | ICD-10-CM | POA: Diagnosis not present

## 2021-11-06 DIAGNOSIS — E785 Hyperlipidemia, unspecified: Secondary | ICD-10-CM | POA: Diagnosis not present

## 2021-11-06 DIAGNOSIS — E119 Type 2 diabetes mellitus without complications: Secondary | ICD-10-CM | POA: Diagnosis not present

## 2021-11-06 DIAGNOSIS — Z9049 Acquired absence of other specified parts of digestive tract: Secondary | ICD-10-CM | POA: Diagnosis not present

## 2021-11-06 DIAGNOSIS — Z96641 Presence of right artificial hip joint: Secondary | ICD-10-CM | POA: Diagnosis not present

## 2021-11-06 DIAGNOSIS — Z7984 Long term (current) use of oral hypoglycemic drugs: Secondary | ICD-10-CM | POA: Diagnosis not present

## 2021-11-06 DIAGNOSIS — Z96652 Presence of left artificial knee joint: Secondary | ICD-10-CM | POA: Diagnosis not present

## 2021-11-06 DIAGNOSIS — Z9181 History of falling: Secondary | ICD-10-CM | POA: Diagnosis not present

## 2021-11-06 DIAGNOSIS — G20 Parkinson's disease: Secondary | ICD-10-CM | POA: Diagnosis not present

## 2021-11-06 DIAGNOSIS — C259 Malignant neoplasm of pancreas, unspecified: Secondary | ICD-10-CM | POA: Diagnosis not present

## 2021-11-09 DIAGNOSIS — Z9181 History of falling: Secondary | ICD-10-CM | POA: Diagnosis not present

## 2021-11-09 DIAGNOSIS — Z9049 Acquired absence of other specified parts of digestive tract: Secondary | ICD-10-CM | POA: Diagnosis not present

## 2021-11-09 DIAGNOSIS — C259 Malignant neoplasm of pancreas, unspecified: Secondary | ICD-10-CM | POA: Diagnosis not present

## 2021-11-09 DIAGNOSIS — E119 Type 2 diabetes mellitus without complications: Secondary | ICD-10-CM | POA: Diagnosis not present

## 2021-11-09 DIAGNOSIS — Z96641 Presence of right artificial hip joint: Secondary | ICD-10-CM | POA: Diagnosis not present

## 2021-11-09 DIAGNOSIS — M1990 Unspecified osteoarthritis, unspecified site: Secondary | ICD-10-CM | POA: Diagnosis not present

## 2021-11-09 DIAGNOSIS — Z96652 Presence of left artificial knee joint: Secondary | ICD-10-CM | POA: Diagnosis not present

## 2021-11-09 DIAGNOSIS — E785 Hyperlipidemia, unspecified: Secondary | ICD-10-CM | POA: Diagnosis not present

## 2021-11-09 DIAGNOSIS — G20 Parkinson's disease: Secondary | ICD-10-CM | POA: Diagnosis not present

## 2021-11-09 DIAGNOSIS — Z7984 Long term (current) use of oral hypoglycemic drugs: Secondary | ICD-10-CM | POA: Diagnosis not present

## 2021-11-09 DIAGNOSIS — Z87440 Personal history of urinary (tract) infections: Secondary | ICD-10-CM | POA: Diagnosis not present

## 2021-11-09 DIAGNOSIS — R531 Weakness: Secondary | ICD-10-CM | POA: Diagnosis not present

## 2021-11-11 DIAGNOSIS — G20 Parkinson's disease: Secondary | ICD-10-CM | POA: Diagnosis not present

## 2021-11-11 DIAGNOSIS — Z96652 Presence of left artificial knee joint: Secondary | ICD-10-CM | POA: Diagnosis not present

## 2021-11-11 DIAGNOSIS — M1990 Unspecified osteoarthritis, unspecified site: Secondary | ICD-10-CM | POA: Diagnosis not present

## 2021-11-11 DIAGNOSIS — E119 Type 2 diabetes mellitus without complications: Secondary | ICD-10-CM | POA: Diagnosis not present

## 2021-11-11 DIAGNOSIS — Z7984 Long term (current) use of oral hypoglycemic drugs: Secondary | ICD-10-CM | POA: Diagnosis not present

## 2021-11-11 DIAGNOSIS — Z96641 Presence of right artificial hip joint: Secondary | ICD-10-CM | POA: Diagnosis not present

## 2021-11-11 DIAGNOSIS — E785 Hyperlipidemia, unspecified: Secondary | ICD-10-CM | POA: Diagnosis not present

## 2021-11-11 DIAGNOSIS — R531 Weakness: Secondary | ICD-10-CM | POA: Diagnosis not present

## 2021-11-11 DIAGNOSIS — Z87440 Personal history of urinary (tract) infections: Secondary | ICD-10-CM | POA: Diagnosis not present

## 2021-11-11 DIAGNOSIS — C259 Malignant neoplasm of pancreas, unspecified: Secondary | ICD-10-CM | POA: Diagnosis not present

## 2021-11-11 DIAGNOSIS — Z9049 Acquired absence of other specified parts of digestive tract: Secondary | ICD-10-CM | POA: Diagnosis not present

## 2021-11-11 DIAGNOSIS — Z9181 History of falling: Secondary | ICD-10-CM | POA: Diagnosis not present

## 2021-11-13 ENCOUNTER — Ambulatory Visit (INDEPENDENT_AMBULATORY_CARE_PROVIDER_SITE_OTHER): Payer: Medicare Other | Admitting: Family Medicine

## 2021-11-13 ENCOUNTER — Encounter: Payer: Self-pay | Admitting: Family Medicine

## 2021-11-13 VITALS — BP 100/64 | HR 102 | Ht 64.0 in | Wt 190.0 lb

## 2021-11-13 DIAGNOSIS — E559 Vitamin D deficiency, unspecified: Secondary | ICD-10-CM

## 2021-11-13 DIAGNOSIS — E118 Type 2 diabetes mellitus with unspecified complications: Secondary | ICD-10-CM | POA: Diagnosis not present

## 2021-11-13 DIAGNOSIS — G20 Parkinson's disease: Secondary | ICD-10-CM | POA: Diagnosis not present

## 2021-11-13 DIAGNOSIS — R2681 Unsteadiness on feet: Secondary | ICD-10-CM | POA: Diagnosis not present

## 2021-11-13 DIAGNOSIS — E538 Deficiency of other specified B group vitamins: Secondary | ICD-10-CM | POA: Diagnosis not present

## 2021-11-13 DIAGNOSIS — E119 Type 2 diabetes mellitus without complications: Secondary | ICD-10-CM | POA: Diagnosis not present

## 2021-11-13 DIAGNOSIS — E1169 Type 2 diabetes mellitus with other specified complication: Secondary | ICD-10-CM | POA: Diagnosis not present

## 2021-11-13 DIAGNOSIS — E785 Hyperlipidemia, unspecified: Secondary | ICD-10-CM

## 2021-11-13 LAB — POCT GLYCOSYLATED HEMOGLOBIN (HGB A1C): HbA1c, POC (controlled diabetic range): 6 % (ref 0.0–7.0)

## 2021-11-13 MED ORDER — METFORMIN HCL 500 MG PO TABS: 500.0000 mg | ORAL_TABLET | Freq: Every day | ORAL | 1 refills | Status: DC

## 2021-11-13 NOTE — Assessment & Plan Note (Signed)
She is using her rolling walker.  Also discussed considering getting some type of life alert since she does live alone.

## 2021-11-13 NOTE — Patient Instructions (Signed)
We will plan to recheck vitamin D and vitamin B12 in about 2 months.

## 2021-11-13 NOTE — Assessment & Plan Note (Signed)
Encouraged to call Dr. Lynnette Caffey with neurology to see if they can refill the amantadine until her follow-up appointment with him in October.  If they have difficulty getting the medication then please let me know.

## 2021-11-13 NOTE — Assessment & Plan Note (Signed)
A1c looks phenomenal today at 6.0 continue current regimen and plan to follow-up in 4 months.

## 2021-11-13 NOTE — Assessment & Plan Note (Signed)
Plan was to get lipids updated.  But she is can to come back in 2 months to have B12 and vitamin D rechecked so we can get the lipid panel at that time.

## 2021-11-13 NOTE — Progress Notes (Signed)
Established Patient Office Visit  Subjective   Patient ID: Paige Moore, female    DOB: 1945/11/23  Age: 76 y.o. MRN: [id number]  No chief complaint on file.   HPI   Diabetes - no hypoglycemic events. No wounds or sores that are not healing well. No increased thirst or urination. Checking glucose at home. Taking medications as prescribed without any side effects.  Currently on metformin.  Follow-up Parkinson's disease-currently on Sinemet CR with Dr. Beckey Rutter at [facility] neurology.  Her dose had been recently adjusted after she was reporting more difficulty with freezing of her gait.  She was also started on amantadine either in the hospital or at the rehab center.  She was discharged home about 2 weeks ago.  She says she is actually been doing well in regards to her tremor up until today.  No falls since she has been home she does use a rolling walker.  He did go to the emergency department on August 1.  For increased tremors.  We had increased her dose of Sinemet around that time.  She was evaluated and found to have a UTI.  Treated with oral Keflex.  MI and pulmonary embolism ruled out.  She was discharged to [facility].  She reports that she is asymptomatic and does not currently have any urinary symptoms.  She is getting home PT.  Her sister Paige Moore is here with her today.  Vitamin 12 deficiency-she is taking a supplement. Vitamin D deficiency-she did start high-dose vitamin D and will be taking that for 4 weeks and then will switch to regular vitamin D.    ROS    Objective:     BP 100/64 (BP Location: Left Arm, Patient Position: Sitting, Cuff Size: Normal)   Pulse (!) 102   Ht '5\' 4"'$  (1.626 m)   Wt 190 lb (86.2 kg)   SpO2 98%   BMI 32.61 kg/m    Physical Exam Vitals and nursing note reviewed.  Constitutional:      Appearance: She is well-developed.  HENT:     Head: Normocephalic and atraumatic.  Cardiovascular:     Rate and Rhythm: Normal rate and  regular rhythm.     Heart sounds: Normal heart sounds.  Pulmonary:     Effort: Pulmonary effort is normal.     Breath sounds: Normal breath sounds.  Skin:    General: Skin is warm and dry.  Neurological:     Mental Status: She is alert and oriented to person, place, and time.  Psychiatric:        Behavior: Behavior normal.      Results for orders placed or performed in visit on 11/13/21  POCT HgB A1C  Result Value Ref Range   Hemoglobin A1C     HbA1c POC (<> result, manual entry)     HbA1c, POC (prediabetic range)     HbA1c, POC (controlled diabetic range) 6.0 0.0 - 7.0 %      The ASCVD Risk score (Arnett DK, et al., 2019) failed to calculate for the following reasons:   The valid total cholesterol range is 130 to 320 mg/dL    Assessment & Plan:   Problem List Items Addressed This Visit       Endocrine   Hyperlipidemia associated with type 2 diabetes mellitus ([place])    Plan was to get lipids updated.  But she is can to come back in 2 months to have B12 and vitamin D rechecked so we  can get the lipid panel at that time.      Relevant Medications   metFORMIN (GLUCOPHAGE) 500 MG tablet   Controlled diabetes mellitus type 2 with complications (HCC) - Primary    A1c looks phenomenal today at 6.0 continue current regimen and plan to follow-up in 4 months.      Relevant Medications   metFORMIN (GLUCOPHAGE) 500 MG tablet   Other Relevant Orders   POCT HgB A1C (Completed)     Nervous and Auditory   Parkinson disease ([place])    Encouraged to call Dr. Lynnette Caffey with neurology to see if they can refill the amantadine until her follow-up appointment with him in October.  If they have difficulty getting the medication then please let me know.      Relevant Medications   amantadine (SYMMETREL) 100 MG capsule     Other   Gait instability    She is using her rolling walker.  Also discussed considering getting some type of life alert since she does live alone.      Other  Visit Diagnoses     B12 deficiency       Vitamin D deficiency          Recheck B12 and vitamin D in 8 weeks.  Return in about 4 months (around 03/22/2022) for Diabetes follow-up.   I spent 40 minutes on the day of the encounter to include pre-visit record review, face-to-face time with the patient and post visit ordering of test.   Beatrice Lecher, MD

## 2021-11-16 DIAGNOSIS — E119 Type 2 diabetes mellitus without complications: Secondary | ICD-10-CM | POA: Diagnosis not present

## 2021-11-16 DIAGNOSIS — E785 Hyperlipidemia, unspecified: Secondary | ICD-10-CM | POA: Diagnosis not present

## 2021-11-16 DIAGNOSIS — C259 Malignant neoplasm of pancreas, unspecified: Secondary | ICD-10-CM | POA: Diagnosis not present

## 2021-11-16 DIAGNOSIS — Z7984 Long term (current) use of oral hypoglycemic drugs: Secondary | ICD-10-CM | POA: Diagnosis not present

## 2021-11-16 DIAGNOSIS — Z9049 Acquired absence of other specified parts of digestive tract: Secondary | ICD-10-CM | POA: Diagnosis not present

## 2021-11-16 DIAGNOSIS — Z96652 Presence of left artificial knee joint: Secondary | ICD-10-CM | POA: Diagnosis not present

## 2021-11-16 DIAGNOSIS — Z9181 History of falling: Secondary | ICD-10-CM | POA: Diagnosis not present

## 2021-11-16 DIAGNOSIS — Z87440 Personal history of urinary (tract) infections: Secondary | ICD-10-CM | POA: Diagnosis not present

## 2021-11-16 DIAGNOSIS — G20 Parkinson's disease: Secondary | ICD-10-CM | POA: Diagnosis not present

## 2021-11-16 DIAGNOSIS — R531 Weakness: Secondary | ICD-10-CM | POA: Diagnosis not present

## 2021-11-16 DIAGNOSIS — Z96641 Presence of right artificial hip joint: Secondary | ICD-10-CM | POA: Diagnosis not present

## 2021-11-16 DIAGNOSIS — M1990 Unspecified osteoarthritis, unspecified site: Secondary | ICD-10-CM | POA: Diagnosis not present

## 2021-11-19 DIAGNOSIS — R531 Weakness: Secondary | ICD-10-CM | POA: Diagnosis not present

## 2021-11-19 DIAGNOSIS — Z9049 Acquired absence of other specified parts of digestive tract: Secondary | ICD-10-CM | POA: Diagnosis not present

## 2021-11-19 DIAGNOSIS — E119 Type 2 diabetes mellitus without complications: Secondary | ICD-10-CM | POA: Diagnosis not present

## 2021-11-19 DIAGNOSIS — Z96652 Presence of left artificial knee joint: Secondary | ICD-10-CM | POA: Diagnosis not present

## 2021-11-19 DIAGNOSIS — Z87440 Personal history of urinary (tract) infections: Secondary | ICD-10-CM | POA: Diagnosis not present

## 2021-11-19 DIAGNOSIS — C259 Malignant neoplasm of pancreas, unspecified: Secondary | ICD-10-CM | POA: Diagnosis not present

## 2021-11-19 DIAGNOSIS — G20 Parkinson's disease: Secondary | ICD-10-CM | POA: Diagnosis not present

## 2021-11-19 DIAGNOSIS — Z7984 Long term (current) use of oral hypoglycemic drugs: Secondary | ICD-10-CM | POA: Diagnosis not present

## 2021-11-19 DIAGNOSIS — E785 Hyperlipidemia, unspecified: Secondary | ICD-10-CM | POA: Diagnosis not present

## 2021-11-19 DIAGNOSIS — Z96641 Presence of right artificial hip joint: Secondary | ICD-10-CM | POA: Diagnosis not present

## 2021-11-19 DIAGNOSIS — Z9181 History of falling: Secondary | ICD-10-CM | POA: Diagnosis not present

## 2021-11-19 DIAGNOSIS — M1990 Unspecified osteoarthritis, unspecified site: Secondary | ICD-10-CM | POA: Diagnosis not present

## 2021-11-23 DIAGNOSIS — E119 Type 2 diabetes mellitus without complications: Secondary | ICD-10-CM | POA: Diagnosis not present

## 2021-11-23 DIAGNOSIS — Z7984 Long term (current) use of oral hypoglycemic drugs: Secondary | ICD-10-CM | POA: Diagnosis not present

## 2021-11-23 DIAGNOSIS — M1990 Unspecified osteoarthritis, unspecified site: Secondary | ICD-10-CM | POA: Diagnosis not present

## 2021-11-23 DIAGNOSIS — Z87440 Personal history of urinary (tract) infections: Secondary | ICD-10-CM | POA: Diagnosis not present

## 2021-11-23 DIAGNOSIS — Z96641 Presence of right artificial hip joint: Secondary | ICD-10-CM | POA: Diagnosis not present

## 2021-11-23 DIAGNOSIS — C259 Malignant neoplasm of pancreas, unspecified: Secondary | ICD-10-CM | POA: Diagnosis not present

## 2021-11-23 DIAGNOSIS — R531 Weakness: Secondary | ICD-10-CM | POA: Diagnosis not present

## 2021-11-23 DIAGNOSIS — Z9181 History of falling: Secondary | ICD-10-CM | POA: Diagnosis not present

## 2021-11-23 DIAGNOSIS — Z96652 Presence of left artificial knee joint: Secondary | ICD-10-CM | POA: Diagnosis not present

## 2021-11-23 DIAGNOSIS — E785 Hyperlipidemia, unspecified: Secondary | ICD-10-CM | POA: Diagnosis not present

## 2021-11-23 DIAGNOSIS — Z9049 Acquired absence of other specified parts of digestive tract: Secondary | ICD-10-CM | POA: Diagnosis not present

## 2021-11-23 DIAGNOSIS — G20 Parkinson's disease: Secondary | ICD-10-CM | POA: Diagnosis not present

## 2021-11-24 DIAGNOSIS — Z9181 History of falling: Secondary | ICD-10-CM | POA: Diagnosis not present

## 2021-11-24 DIAGNOSIS — Z96641 Presence of right artificial hip joint: Secondary | ICD-10-CM | POA: Diagnosis not present

## 2021-11-24 DIAGNOSIS — M1990 Unspecified osteoarthritis, unspecified site: Secondary | ICD-10-CM | POA: Diagnosis not present

## 2021-11-24 DIAGNOSIS — G20 Parkinson's disease: Secondary | ICD-10-CM | POA: Diagnosis not present

## 2021-11-24 DIAGNOSIS — Z96652 Presence of left artificial knee joint: Secondary | ICD-10-CM | POA: Diagnosis not present

## 2021-11-24 DIAGNOSIS — E785 Hyperlipidemia, unspecified: Secondary | ICD-10-CM | POA: Diagnosis not present

## 2021-11-24 DIAGNOSIS — Z9049 Acquired absence of other specified parts of digestive tract: Secondary | ICD-10-CM | POA: Diagnosis not present

## 2021-11-24 DIAGNOSIS — Z7984 Long term (current) use of oral hypoglycemic drugs: Secondary | ICD-10-CM | POA: Diagnosis not present

## 2021-11-24 DIAGNOSIS — R531 Weakness: Secondary | ICD-10-CM | POA: Diagnosis not present

## 2021-11-24 DIAGNOSIS — C259 Malignant neoplasm of pancreas, unspecified: Secondary | ICD-10-CM | POA: Diagnosis not present

## 2021-11-24 DIAGNOSIS — E119 Type 2 diabetes mellitus without complications: Secondary | ICD-10-CM | POA: Diagnosis not present

## 2021-11-24 DIAGNOSIS — Z87440 Personal history of urinary (tract) infections: Secondary | ICD-10-CM | POA: Diagnosis not present

## 2021-11-30 DIAGNOSIS — R531 Weakness: Secondary | ICD-10-CM | POA: Diagnosis not present

## 2021-11-30 DIAGNOSIS — Z87440 Personal history of urinary (tract) infections: Secondary | ICD-10-CM | POA: Diagnosis not present

## 2021-11-30 DIAGNOSIS — G20 Parkinson's disease: Secondary | ICD-10-CM | POA: Diagnosis not present

## 2021-11-30 DIAGNOSIS — C259 Malignant neoplasm of pancreas, unspecified: Secondary | ICD-10-CM | POA: Diagnosis not present

## 2021-11-30 DIAGNOSIS — E785 Hyperlipidemia, unspecified: Secondary | ICD-10-CM | POA: Diagnosis not present

## 2021-11-30 DIAGNOSIS — Z9181 History of falling: Secondary | ICD-10-CM | POA: Diagnosis not present

## 2021-11-30 DIAGNOSIS — Z96652 Presence of left artificial knee joint: Secondary | ICD-10-CM | POA: Diagnosis not present

## 2021-11-30 DIAGNOSIS — Z96641 Presence of right artificial hip joint: Secondary | ICD-10-CM | POA: Diagnosis not present

## 2021-11-30 DIAGNOSIS — Z7984 Long term (current) use of oral hypoglycemic drugs: Secondary | ICD-10-CM | POA: Diagnosis not present

## 2021-11-30 DIAGNOSIS — Z9049 Acquired absence of other specified parts of digestive tract: Secondary | ICD-10-CM | POA: Diagnosis not present

## 2021-11-30 DIAGNOSIS — E119 Type 2 diabetes mellitus without complications: Secondary | ICD-10-CM | POA: Diagnosis not present

## 2021-11-30 DIAGNOSIS — M1990 Unspecified osteoarthritis, unspecified site: Secondary | ICD-10-CM | POA: Diagnosis not present

## 2021-12-14 DIAGNOSIS — Z7984 Long term (current) use of oral hypoglycemic drugs: Secondary | ICD-10-CM | POA: Diagnosis not present

## 2021-12-14 DIAGNOSIS — C259 Malignant neoplasm of pancreas, unspecified: Secondary | ICD-10-CM | POA: Diagnosis not present

## 2021-12-14 DIAGNOSIS — E785 Hyperlipidemia, unspecified: Secondary | ICD-10-CM | POA: Diagnosis not present

## 2021-12-14 DIAGNOSIS — Z96652 Presence of left artificial knee joint: Secondary | ICD-10-CM | POA: Diagnosis not present

## 2021-12-14 DIAGNOSIS — M1990 Unspecified osteoarthritis, unspecified site: Secondary | ICD-10-CM | POA: Diagnosis not present

## 2021-12-14 DIAGNOSIS — Z96641 Presence of right artificial hip joint: Secondary | ICD-10-CM | POA: Diagnosis not present

## 2021-12-14 DIAGNOSIS — E119 Type 2 diabetes mellitus without complications: Secondary | ICD-10-CM | POA: Diagnosis not present

## 2021-12-14 DIAGNOSIS — Z9181 History of falling: Secondary | ICD-10-CM | POA: Diagnosis not present

## 2021-12-14 DIAGNOSIS — Z87440 Personal history of urinary (tract) infections: Secondary | ICD-10-CM | POA: Diagnosis not present

## 2021-12-14 DIAGNOSIS — R531 Weakness: Secondary | ICD-10-CM | POA: Diagnosis not present

## 2021-12-14 DIAGNOSIS — Z9049 Acquired absence of other specified parts of digestive tract: Secondary | ICD-10-CM | POA: Diagnosis not present

## 2021-12-14 DIAGNOSIS — G20A1 Parkinson's disease without dyskinesia, without mention of fluctuations: Secondary | ICD-10-CM | POA: Diagnosis not present

## 2021-12-17 DIAGNOSIS — G20A1 Parkinson's disease without dyskinesia, without mention of fluctuations: Secondary | ICD-10-CM | POA: Diagnosis not present

## 2021-12-28 DIAGNOSIS — E119 Type 2 diabetes mellitus without complications: Secondary | ICD-10-CM | POA: Diagnosis not present

## 2021-12-28 DIAGNOSIS — E785 Hyperlipidemia, unspecified: Secondary | ICD-10-CM | POA: Diagnosis not present

## 2021-12-28 DIAGNOSIS — R531 Weakness: Secondary | ICD-10-CM | POA: Diagnosis not present

## 2021-12-28 DIAGNOSIS — Z87440 Personal history of urinary (tract) infections: Secondary | ICD-10-CM | POA: Diagnosis not present

## 2021-12-28 DIAGNOSIS — Z7984 Long term (current) use of oral hypoglycemic drugs: Secondary | ICD-10-CM | POA: Diagnosis not present

## 2021-12-28 DIAGNOSIS — Z96652 Presence of left artificial knee joint: Secondary | ICD-10-CM | POA: Diagnosis not present

## 2021-12-28 DIAGNOSIS — Z9049 Acquired absence of other specified parts of digestive tract: Secondary | ICD-10-CM | POA: Diagnosis not present

## 2021-12-28 DIAGNOSIS — Z96641 Presence of right artificial hip joint: Secondary | ICD-10-CM | POA: Diagnosis not present

## 2021-12-28 DIAGNOSIS — M1990 Unspecified osteoarthritis, unspecified site: Secondary | ICD-10-CM | POA: Diagnosis not present

## 2021-12-28 DIAGNOSIS — G20A1 Parkinson's disease without dyskinesia, without mention of fluctuations: Secondary | ICD-10-CM | POA: Diagnosis not present

## 2021-12-28 DIAGNOSIS — C259 Malignant neoplasm of pancreas, unspecified: Secondary | ICD-10-CM | POA: Diagnosis not present

## 2021-12-28 DIAGNOSIS — Z9181 History of falling: Secondary | ICD-10-CM | POA: Diagnosis not present

## 2022-02-01 ENCOUNTER — Telehealth: Payer: Self-pay | Admitting: General Practice

## 2022-02-01 NOTE — Telephone Encounter (Signed)
Left message for patient to schedule medicare wellness visit.  

## 2022-03-22 ENCOUNTER — Encounter: Payer: Self-pay | Admitting: Family Medicine

## 2022-03-22 ENCOUNTER — Ambulatory Visit (INDEPENDENT_AMBULATORY_CARE_PROVIDER_SITE_OTHER): Payer: Medicare Other | Admitting: Family Medicine

## 2022-03-22 VITALS — BP 128/88 | HR 84 | Ht 64.0 in | Wt 178.0 lb

## 2022-03-22 DIAGNOSIS — E118 Type 2 diabetes mellitus with unspecified complications: Secondary | ICD-10-CM | POA: Diagnosis not present

## 2022-03-22 DIAGNOSIS — E785 Hyperlipidemia, unspecified: Secondary | ICD-10-CM | POA: Diagnosis not present

## 2022-03-22 DIAGNOSIS — G20A1 Parkinson's disease without dyskinesia, without mention of fluctuations: Secondary | ICD-10-CM | POA: Diagnosis not present

## 2022-03-22 DIAGNOSIS — Z23 Encounter for immunization: Secondary | ICD-10-CM

## 2022-03-22 LAB — POCT GLYCOSYLATED HEMOGLOBIN (HGB A1C): Hemoglobin A1C: 6.5 % — AB (ref 4.0–5.6)

## 2022-03-22 NOTE — Assessment & Plan Note (Signed)
She is due to repeat lipids but just had labs done in August while she was in the hospital renal function was stable.  So we discussed getting labs at her next follow-up in 3 months.

## 2022-03-22 NOTE — Progress Notes (Signed)
Established Patient Office Visit  Subjective   Patient ID: Paige Moore, female    DOB: August 24, 1945  Age: 77 y.o. MRN: [id number]  Chief Complaint  Patient presents with   Diabetes    Requested eye exam from [facility] Dr. Francia Greaves    HPI  Diabetes - no hypoglycemic events. No wounds or sores that are not healing well. No increased thirst or urination. Checking glucose at home. Taking medications as prescribed without any side effects.  For last eye exam.  F/U Parkinsons - she has  a new Neurologist for her Parkinson's and really like him. She gave up driving.  She says the addition of the amantadine has actually been helpful.  He also adjusted her Sinemet as well.  She says the tremor really is not a lot better but the freezing has almost completely stopped which has been great.    ROS    Objective:     BP 128/88   Pulse 84   Ht '5\' 4"'$  (1.626 m)   Wt 178 lb (80.7 kg)   SpO2 100%   BMI 30.55 kg/m    Physical Exam Vitals and nursing note reviewed.  Constitutional:      Appearance: She is well-developed.  HENT:     Head: Normocephalic and atraumatic.  Cardiovascular:     Rate and Rhythm: Normal rate and regular rhythm.     Heart sounds: Normal heart sounds.  Pulmonary:     Effort: Pulmonary effort is normal.     Breath sounds: Normal breath sounds.  Skin:    General: Skin is warm and dry.  Neurological:     Mental Status: She is alert and oriented to person, place, and time.  Psychiatric:        Behavior: Behavior normal.      Results for orders placed or performed in visit on 03/22/22  POCT glycosylated hemoglobin (Hb A1C)  Result Value Ref Range   Hemoglobin A1C 6.5 (A) 4.0 - 5.6 %   HbA1c POC (<> result, manual entry)     HbA1c, POC (prediabetic range)     HbA1c, POC (controlled diabetic range)        The ASCVD Risk score (Arnett DK, et al., 2019) failed to calculate for the following reasons:   The valid total cholesterol range is 130 to 320  mg/dL    Assessment & Plan:   Problem List Items Addressed This Visit       Endocrine   Controlled diabetes mellitus type 2 with complications ([place]) - Primary    He went up slightly today to 6.5 from 6.0.  Just encouraged her to continue work on [employer] and staying active.  She did have extensive blood work done back in August when she was in the hospital so we will plan to repeat labs when I see her back in 3 to 4 months.      Relevant Orders   POCT glycosylated hemoglobin (Hb A1C) (Completed)     Nervous and Auditory   Parkinson disease    Been really happy with the addition of the amantadine it has really helped tremendously with her freezing.  She is no longer driving.        Other   Hyperlipidemia    She is due to repeat lipids but just had labs done in August while she was in the hospital renal function was stable.  So we discussed getting labs at her next follow-up in 3  months.      Other Visit Diagnoses     Need for immunization against influenza       Relevant Orders   Flu Vaccine QUAD High Dose(Fluad) (Completed)       Urged her to schedule her Medicare wellness exam with our Medicare wellness nurse.  Return in about 15 weeks (around 07/05/2022) for Diabetes follow-up and labwork .    Beatrice Lecher, MD

## 2022-03-22 NOTE — Assessment & Plan Note (Signed)
He went up slightly today to 6.5 from 6.0.  Just encouraged her to continue work on [employer] and staying active.  She did have extensive blood work done back in August when she was in the hospital so we will plan to repeat labs when I see her back in 3 to 4 months.

## 2022-03-22 NOTE — Assessment & Plan Note (Signed)
Been really happy with the addition of the amantadine it has really helped tremendously with her freezing.  She is no longer driving.

## 2022-04-12 ENCOUNTER — Ambulatory Visit (INDEPENDENT_AMBULATORY_CARE_PROVIDER_SITE_OTHER): Payer: 59 | Admitting: Family Medicine

## 2022-04-12 DIAGNOSIS — Z0000 Encounter for general adult medical examination without abnormal findings: Secondary | ICD-10-CM | POA: Diagnosis not present

## 2022-04-12 NOTE — Patient Instructions (Signed)
[facility] Maintenance Summary and Written Plan of Care  Paige Moore ,  Thank you for allowing me to perform your Medicare Annual Wellness Visit and for your ongoing commitment to your health.   Health Maintenance & Immunization History Health Maintenance  Topic Date Due   DTaP/Tdap/Td (2 - Td or Tdap) 03/15/2022   OPHTHALMOLOGY EXAM  04/12/2022 (Originally 06/27/2021)   FOOT EXAM  04/13/2022 (Originally 03/26/2022)   Diabetic kidney evaluation - eGFR measurement  04/14/2022 (Originally 03/26/2022)   Diabetic kidney evaluation - Urine ACR  04/15/2022 (Originally 06/03/2021)   Zoster Vaccines- Shingrix (1 of 2) 04/15/2022 (Originally 07/05/1964)   COVID-19 Vaccine (4 - 2023-24 season) 04/08/2023 (Originally 11/13/2021)   HEMOGLOBIN A1C  09/20/2022   Medicare Annual Wellness (AWV)  04/13/2023   Pneumonia Vaccine 19+ Years old  Completed   INFLUENZA VACCINE  Completed   DEXA SCAN  Completed   Hepatitis C Screening  Completed   HPV VACCINES  Aged Out   COLONOSCOPY (Pts 45-35yr Insurance coverage will need to be confirmed)  Discontinued   Immunization History  Administered Date(s) Administered   Fluad Quad(high Dose 65+) 01/03/2019, 01/23/2020, 03/26/2021, 03/22/2022   Influenza Split 12/16/2010, 12/27/2011   Influenza Whole 12/27/2005, 01/31/2007, 12/22/2009   Influenza, High Dose Seasonal PF 12/19/2015, 12/09/2016, 01/10/2018   Influenza,inj,Quad PF,6+ Mos 01/08/2013, 12/13/2013, 12/24/2014   PFIZER(Purple Top)SARS-COV-2 Vaccination 06/05/2019, 06/26/2019, 01/23/2020   Pneumococcal Conjugate-13 09/23/2014   Pneumococcal Polysaccharide-23 06/23/2011   Tdap 03/15/2012   Zoster, Live 04/15/2012    These are the patient goals that we discussed:  Goals Addressed               This Visit's Progress     Patient Stated (pt-stated)        Patient stated that she would like to be able to walk better. She was recently diagnosed with parkinson's          This is a list of Health Maintenance Items that are overdue or due now: Health Maintenance Due  Topic Date Due   DTaP/Tdap/Td (2 - Td or Tdap) 03/15/2022   Td vaccine Shingles vaccine Foot exam Urine ACR Bone density scan GFR measurement  Orders/Referrals Placed Today: No orders of the defined types were placed in this encounter.  (Contact our referral department at [phone] you have not spoken with someone about your referral appointment within the next 5 days)    Follow-up Plan Follow-up with MHali Marry MD as planned Schedule the tetanus vaccine and shingles vaccine at the pharmacy.  Let [place] when you are ready for the bone density referral. Medicare wellness visit in one year. AVS printed and mailed to the patient.      Health Maintenance, Female Adopting a healthy lifestyle and getting preventive care are important in promoting health and wellness. Ask your health care provider about: The right schedule for you to have regular tests and exams. Things you can do on your own to prevent diseases and keep yourself healthy. What should I know about diet, weight, and exercise? Eat a healthy diet  Eat a diet that includes plenty of vegetables, fruits, low-fat dairy products, and lean protein. Do not eat a lot of foods that are high in solid fats, added sugars, or sodium. Maintain a healthy weight Body mass index (BMI) is used to identify weight problems. It estimates body fat based on height and weight. Your health care provider can help determine your BMI and help you achieve  or maintain a healthy weight. Get regular exercise Get regular exercise. This is one of the most important things you can do for your health. Most adults should: Exercise for at least 150 minutes each week. The exercise should increase your heart rate and make you sweat (moderate-intensity exercise). Do strengthening exercises at least twice a week. This is in addition to the  moderate-intensity exercise. Spend less time sitting. Even light physical activity can be beneficial. Watch cholesterol and blood lipids Have your blood tested for lipids and cholesterol at 77 years of age, then have this test every 5 years. Have your cholesterol levels checked more often if: Your lipid or cholesterol levels are high. You are older than 77 years of age. You are at high risk for heart disease. What should I know about cancer screening? Depending on your health history and family history, you may need to have cancer screening at various ages. This may include screening for: Breast cancer. Cervical cancer. Colorectal cancer. Skin cancer. Lung cancer. What should I know about heart disease, diabetes, and high blood pressure? Blood pressure and heart disease High blood pressure causes heart disease and increases the risk of stroke. This is more likely to develop in people who have high blood pressure readings or are overweight. Have your blood pressure checked: Every 3-5 years if you are 66-26 years of age. Every year if you are 61 years old or older. Diabetes Have regular diabetes screenings. This checks your fasting blood sugar level. Have the screening done: Once every three years after age 42 if you are at a normal weight and have a low risk for diabetes. More often and at a younger age if you are overweight or have a high risk for diabetes. What should I know about preventing infection? Hepatitis B If you have a higher risk for hepatitis B, you should be screened for this virus. Talk with your health care provider to find out if you are at risk for hepatitis B infection. Hepatitis C Testing is recommended for: Everyone born from [birth year] through 1965. Anyone with known risk factors for hepatitis C. Sexually transmitted infections (STIs) Get screened for STIs, including gonorrhea and chlamydia, if: You are sexually active and are younger than 77 years of age. You are  older than 77 years of age and your health care provider tells you that you are at risk for this type of infection. Your sexual activity has changed since you were last screened, and you are at increased risk for chlamydia or gonorrhea. Ask your health care provider if you are at risk. Ask your health care provider about whether you are at high risk for HIV. Your health care provider may recommend a prescription medicine to help prevent HIV infection. If you choose to take medicine to prevent HIV, you should first get tested for HIV. You should then be tested every 3 months for as long as you are taking the medicine. Pregnancy If you are about to stop having your period (premenopausal) and you may become pregnant, seek counseling before you get pregnant. Take 400 to 800 micrograms (mcg) of folic acid every day if you become pregnant. Ask for birth control (contraception) if you want to prevent pregnancy. Osteoporosis and menopause Osteoporosis is a disease in which the bones lose minerals and strength with aging. This can result in bone fractures. If you are 89 years old or older, or if you are at risk for osteoporosis and fractures, ask your health care provider if you  should: Be screened for bone loss. Take a calcium or vitamin D supplement to lower your risk of fractures. Be given hormone replacement therapy (HRT) to treat symptoms of menopause. Follow these instructions at home: Alcohol use Do not drink alcohol if: Your health care provider tells you not to drink. You are pregnant, may be pregnant, or are planning to become pregnant. If you drink alcohol: Limit how much you have to: 0-1 drink a day. Know how much alcohol is in your drink. In the [place] one drink equals one 12 oz bottle of beer (355 mL), one 5 oz glass of wine (148 mL), or one 1 oz glass of hard liquor (44 mL). Lifestyle Do not use any products that contain nicotine or tobacco. These products include cigarettes, chewing  tobacco, and vaping devices, such as e-cigarettes. If you need help quitting, ask your health care provider. Do not use street drugs. Do not share needles. Ask your health care provider for help if you need support or information about quitting drugs. General instructions Schedule regular health, dental, and eye exams. Stay current with your vaccines. Tell your health care provider if: You often feel depressed. You have ever been abused or do not feel safe at home. Summary Adopting a healthy lifestyle and getting preventive care are important in promoting health and wellness. Follow your health care provider's instructions about healthy diet, exercising, and getting tested or screened for diseases. Follow your health care provider's instructions on monitoring your cholesterol and blood pressure. This information is not intended to replace advice given to you by your health care provider. Make sure you discuss any questions you have with your health care provider. Document Revised: 07/21/2020 Document Reviewed: 07/21/2020 Elsevier Patient Education  [place].

## 2022-04-12 NOTE — Progress Notes (Signed)
MEDICARE ANNUAL WELLNESS VISIT  04/12/2022  Telephone Visit Disclaimer This Medicare AWV was conducted by telephone due to national recommendations for restrictions regarding the COVID-19 Pandemic (e.g. social distancing).  I verified, using two identifiers, that I am speaking with Paige Moore or their authorized healthcare agent. I discussed the limitations, risks, security, and privacy concerns of performing an evaluation and management service by telephone and the potential availability of an in-person appointment in the future. The patient expressed understanding and agreed to proceed.  Location of Patient: Home Location of Provider (nurse):  In the office  Subjective:    Paige Moore is a 77 y.o. female patient of Metheney, Rene Kocher, MD who had a Medicare Annual Wellness Visit today via telephone. Paige Moore is Retired and lives alone. she had 2 children; but one has deceased. she reports that she is socially active and does interact with friends/family regularly. she is minimally physically active and enjoys enjoys walking.  Patient Care Team: Hali Marry, MD as PCP - General (Family Medicine) Francia Greaves, DO as Referring Physician (Osteopathic Medicine)     04/12/2022    8:10 AM 05/04/2016   10:09 AM 10/11/2013    6:27 AM  Advanced Directives  Does Patient Have a Medical Advance Directive? No Yes Patient has advance directive, copy not in chart  Type of Advance Directive  [place];Living will [place]  Does patient want to make changes to medical advance directive?  No - Patient declined   Copy of [place] in Chart?  No - copy requested   Would patient like information on creating a medical advance directive? No - Patient declined      Hospital Utilization Over the Past 12 Months: # of hospitalizations or ER visits: 2 # of surgeries: 0  Review of Systems    Patient reports that her overall health is  unchanged compared to last year.  History obtained from chart review and the patient  Patient Reported Readings (BP, Pulse, CBG, Weight, etc) none  Pain Assessment Pain : No/denies pain     Current Medications & Allergies (verified) Allergies as of 04/12/2022       Reactions   Ropinirole Hcl Other (See Comments)   Left foot swelling.  Other reaction(s): Other Left foot swelling.         Medication List        Accurate as of April 12, 2022  8:35 AM. If you have any questions, ask your nurse or doctor.          acetaminophen 500 MG tablet Commonly known as: TYLENOL Take by mouth. 1-2 tabs po tid prn   amantadine 100 MG capsule Commonly known as: SYMMETREL Take 100 mg by mouth 2 (two) times daily.   atorvastatin 40 MG tablet Commonly known as: LIPITOR Take 1 tablet (40 mg total) by mouth daily.   B-12 500 MCG Subl Place 500 mcg under the tongue daily.   carbidopa-levodopa 50-200 MG tablet Commonly known as: SINEMET CR Take 1 tablet by mouth 2 (two) times daily. Appt for refills   carbidopa-levodopa 25-100 MG tablet Commonly known as: SINEMET IR TAKE 1 TABLET BY MOUTH ONCE DAILY AT NOON. Needs appointment.   glucose blood test strip Commonly known as: ONE TOUCH ULTRA TEST USE 1 STRIP TO CHECK GLUCOSE ONCE DAILY   ibuprofen 200 MG tablet Commonly known as: ADVIL Take 200 mg by mouth every 6 (six) hours as needed.  metFORMIN 500 MG tablet Commonly known as: GLUCOPHAGE Take 1 tablet (500 mg total) by mouth daily with breakfast.   Omega-3 1000 MG Caps Take by mouth.   ONE TOUCH ULTRA SYSTEM KIT w/Device Kit 1 kit by Does not apply route once.   OneTouch Delica Lancets 00Q Misc USE 1  TO CHECK GLUCOSE ONCE DAILY   Vitamin D (Ergocalciferol) 1.25 MG (50000 UNIT) Caps capsule Commonly known as: DRISDOL Take 50,000 Units by mouth once a week.        History (reviewed): Past Medical History:  Diagnosis Date   Essential tremor     Hyperlipidemia    OA (osteoarthritis)    PONV (postoperative nausea and vomiting)    Prediabetes    VIN III (vulvar intraepithelial neoplasia III)    Wears dentures    upper   Wears glasses    Past Surgical History:  Procedure Laterality Date   CESAREAN SECTION     X 2   Corneal topography  10/09/2018   KNEE ARTHROSCOPY Left    ORIF RIGHT ANKLE FX  1987   later hardware removed   PLANTAR FASCIA RELEASE  12-02-2003   RIGHT HEEL   TOTAL ABDOMINAL HYSTERECTOMY  age 19   TOTAL HIP ARTHROPLASTY Left 7/ 2011    [facility]   TOTAL KNEE ARTHROPLASTY Left 07-11-2010   [facility]   VULVECTOMY Left 10/11/2013   Procedure: WIDE LOCAL EXCISON OF LEFT VULVA;  Surgeon: Everitt Amber, MD;  Location: [facility];  Service: Gynecology;  Laterality: Left;   WRIST GANGLION EXCISION Left 2001   Family History  Problem Relation Age of Onset   Diabetes Father    Cancer Sister        endometrial cancer   Osteoporosis Mother    Hypothyroidism Mother    Social History   Socioeconomic History   Marital status: Widowed    Spouse name: Not on file   Number of children: 2   Years of education: 8   Highest education level: 8th grade  Occupational History   Occupation: [employer]    Comment: retired  Tobacco Use   Smoking status: Former    Packs/day: 0.25    Years: 15.00    Total pack years: 3.75    Types: Cigarettes    Quit date: 03/15/2006    Years since quitting: 16.0   Smokeless tobacco: Never  Vaping Use   Vaping Use: Never used  Substance and Sexual Activity   Alcohol use: No   Drug use: No   Sexual activity: Not Currently  Other Topics Concern   Not on file  Social History Narrative   Retired.Walks. Likes to walk in her neighborhood. Drinks a lot of water daiily   Social Determinants of Health   Financial Resource Strain: Low Risk  (04/12/2022)   Overall Financial Resource Strain (CARDIA)    Difficulty of Paying Living Expenses: Not hard at all  Food Insecurity: No  Food Insecurity (04/12/2022)   Hunger Vital Sign    Worried About Running Out of Food in the Last Year: Never true    Ran Out of Food in the Last Year: Never true  Transportation Needs: No Transportation Needs (04/12/2022)   PRAPARE - Hydrologist (Medical): No    Lack of Transportation (Non-Medical): No  Physical Activity: Inactive (04/12/2022)   Exercise Vital Sign    Days of Exercise per Week: 0 days    Minutes of Exercise per Session: 0 min  Stress: No Stress Concern Present (04/12/2022)   [place]    Feeling of Stress : Not at all  Social Connections: Socially Isolated (04/12/2022)   Social Connection and Isolation Panel [NHANES]    Frequency of Communication with Friends and Family: More than three times a week    Frequency of Social Gatherings with Friends and Family: Three times a week    Attends Religious Services: Never    Active Member of Clubs or Organizations: No    Attends Archivist Meetings: Never    Marital Status: Widowed    Activities of Daily Living    04/12/2022    8:16 AM  In your present state of health, do you have any difficulty performing the following activities:  Hearing? 0  Vision? 0  Difficulty concentrating or making decisions? 1  Comment some memory loss  Walking or climbing stairs? 1  Comment uses a walker  Dressing or bathing? 0  Doing errands, shopping? 1  Comment does not drive  Preparing Food and eating ? Y  Comment uses a microwave more  Using the Toilet? N  In the past six months, have you accidently leaked urine? N  Do you have problems with loss of bowel control? N  Managing your Medications? N  Managing your Finances? N  Housekeeping or managing your Housekeeping? N    Patient Education/ Literacy How often do you need to have someone help you when you read instructions, pamphlets, or other written materials from your doctor or  pharmacy?: 1 - Never What is the last grade level you completed in school?: 9th grade  Exercise Current Exercise Habits: The patient does not participate in regular exercise at present, Exercise limited by: neurologic condition(s)  Diet Patient reports consuming  2-3  meals a day and 2-3 snack(s) a day Patient reports that her primary diet is: Regular Patient reports that she does have regular access to food.   Depression Screen    04/12/2022    8:11 AM 03/22/2022    1:20 PM 03/26/2021   11:38 AM 01/23/2020   10:31 AM 01/03/2019   10:55 AM 05/22/2018   10:12 AM 05/17/2017   10:53 AM  PHQ 2/9 Scores  PHQ - 2 Score 0 0 0 0 0 0 0     Fall Risk    04/12/2022    8:11 AM 03/22/2022    1:19 PM 03/26/2021   11:52 AM 01/23/2020   10:31 AM 01/03/2019   10:55 AM  [place] in the past year? 0 0 0 1 0  Number falls in past yr: 0 0 0 0 0  Injury with Fall? 0 0 0 0 0  Risk for fall due to : Impaired balance/gait Impaired mobility Impaired mobility Impaired mobility   Follow up Falls evaluation completed;Education provided;Falls prevention discussed Falls evaluation completed Falls evaluation completed Falls prevention discussed      Objective:  Paige Moore seemed alert and oriented and she participated appropriately during our telephone visit.  Blood Pressure Weight BMI  BP Readings from Last 3 Encounters:  03/22/22 128/88  11/13/21 100/64  03/26/21 (!) 127/99   Wt Readings from Last 3 Encounters:  03/22/22 178 lb (80.7 kg)  11/13/21 190 lb (86.2 kg)  03/26/21 190 lb (86.2 kg)   BMI Readings from Last 1 Encounters:  03/22/22 30.55 kg/m    *Unable to obtain current vital signs, weight, and BMI due to telephone  visit type  Hearing/Vision  Latisha did not seem to have difficulty with hearing/understanding during the telephone conversation Reports that she has had a formal eye exam by an eye care professional within the past year Reports that she has not had a formal hearing  evaluation within the past year *Unable to fully assess hearing and vision during telephone visit type  Cognitive Function:    04/12/2022    8:20 AM 05/22/2018   10:22 AM 05/04/2016   10:07 AM  6CIT Screen  What Year? 0 points  0 points  What month? 0 points  0 points  What time? 0 points 0 points 0 points  Count back from 20 2 points 0 points 0 points  Months in reverse 0 points 2 points   Repeat phrase 2 points 0 points 0 points  Total Score 4 points     (Normal:0-7, Significant for Dysfunction: >8)  Normal Cognitive Function Screening: Yes   Immunization & Health Maintenance Record Immunization History  Administered Date(s) Administered   Fluad Quad(high Dose 65+) 01/03/2019, 01/23/2020, 03/26/2021, 03/22/2022   Influenza Split 12/16/2010, 12/27/2011   Influenza Whole 12/27/2005, 01/31/2007, 12/22/2009   Influenza, High Dose Seasonal PF 12/19/2015, 12/09/2016, 01/10/2018   Influenza,inj,Quad PF,6+ Mos 01/08/2013, 12/13/2013, 12/24/2014   PFIZER(Purple Top)SARS-COV-2 Vaccination 06/05/2019, 06/26/2019, 01/23/2020   Pneumococcal Conjugate-13 09/23/2014   Pneumococcal Polysaccharide-23 06/23/2011   Tdap 03/15/2012   Zoster, Live 04/15/2012    Health Maintenance  Topic Date Due   DTaP/Tdap/Td (2 - Td or Tdap) 03/15/2022   OPHTHALMOLOGY EXAM  04/12/2022 (Originally 06/27/2021)   FOOT EXAM  04/13/2022 (Originally 03/26/2022)   Diabetic kidney evaluation - eGFR measurement  04/14/2022 (Originally 03/26/2022)   Diabetic kidney evaluation - Urine ACR  04/15/2022 (Originally 06/03/2021)   Zoster Vaccines- Shingrix (1 of 2) 04/15/2022 (Originally 07/05/1964)   COVID-19 Vaccine (4 - 2023-24 season) 04/08/2023 (Originally 11/13/2021)   HEMOGLOBIN A1C  09/20/2022   Medicare Annual Wellness (AWV)  04/13/2023   Pneumonia Vaccine 10+ Years old  Completed   INFLUENZA VACCINE  Completed   DEXA SCAN  Completed   Hepatitis C Screening  Completed   HPV VACCINES  Aged Out   COLONOSCOPY (Pts  45-84yr Insurance coverage will need to be confirmed)  Discontinued       Assessment  This is a routine wellness examination for [employer]  Health Maintenance: Due or Overdue Health Maintenance Due  Topic Date Due   DTaP/Tdap/Td (2 - Td or Tdap) 03/15/2022    LNorval Gabledoes not need a referral for Community Assistance: Care Management:   no Social Work:    no Prescription Assistance:  no Nutrition/Diabetes Education:  no   Plan:  Personalized Goals  Goals Addressed               This Visit's Progress     Patient Stated (pt-stated)        Patient stated that she would like to be able to walk better. She was recently diagnosed with parkinson's       Personalized Health Maintenance & Screening Recommendations  Td vaccine Shingles vaccine Foot exam Urine ACR GFR measurement Bone density scan  Lung Cancer Screening Recommended: no (Low Dose CT Chest recommended if Age 77-80years, 30 pack-year currently smoking OR have quit w/in past 15 years) Hepatitis C Screening recommended: no HIV Screening recommended: no  Advanced Directives: Written information was not prepared per patient's request.  Referrals & Orders No orders of the defined types were  placed in this encounter.   Follow-up Plan Follow-up with Hali Marry, MD as planned Schedule the tetanus vaccine and shingles vaccine at the pharmacy.  Let us know when you are ready for the bone density referral. Medicare wellness visit in one year. AVS printed and mailed to the patient.   I have personally reviewed and noted the following in the patient's chart:   Medical and social history Use of alcohol, tobacco or illicit drugs  Current medications and supplements Functional ability and status Nutritional status Physical activity Advanced directives List of other physicians Hospitalizations, surgeries, and ER visits in previous 12 months Vitals Screenings to include cognitive,  depression, and falls Referrals and appointments  In addition, I have reviewed and discussed with Paige Moore certain preventive protocols, quality metrics, and best practice recommendations. A written personalized care plan for preventive services as well as general preventive health recommendations is available and can be mailed to the patient at her request.      Tinnie Gens, RN BSN  04/12/2022

## 2022-05-03 ENCOUNTER — Other Ambulatory Visit: Payer: Self-pay | Admitting: Family Medicine

## 2022-05-03 DIAGNOSIS — E1169 Type 2 diabetes mellitus with other specified complication: Secondary | ICD-10-CM

## 2022-05-18 DIAGNOSIS — G20A1 Parkinson's disease without dyskinesia, without mention of fluctuations: Secondary | ICD-10-CM | POA: Diagnosis not present

## 2022-06-04 ENCOUNTER — Ambulatory Visit: Payer: Medicare Other | Admitting: Family Medicine

## 2022-07-06 ENCOUNTER — Ambulatory Visit: Payer: Medicare Other | Admitting: Family Medicine

## 2022-07-13 ENCOUNTER — Ambulatory Visit (INDEPENDENT_AMBULATORY_CARE_PROVIDER_SITE_OTHER): Payer: 59 | Admitting: Family Medicine

## 2022-07-13 ENCOUNTER — Encounter: Payer: Self-pay | Admitting: Family Medicine

## 2022-07-13 VITALS — BP 127/75 | HR 86 | Ht 64.0 in | Wt 178.0 lb

## 2022-07-13 DIAGNOSIS — G20A1 Parkinson's disease without dyskinesia, without mention of fluctuations: Secondary | ICD-10-CM | POA: Diagnosis not present

## 2022-07-13 DIAGNOSIS — R809 Proteinuria, unspecified: Secondary | ICD-10-CM | POA: Diagnosis not present

## 2022-07-13 DIAGNOSIS — E118 Type 2 diabetes mellitus with unspecified complications: Secondary | ICD-10-CM | POA: Diagnosis not present

## 2022-07-13 DIAGNOSIS — E1169 Type 2 diabetes mellitus with other specified complication: Secondary | ICD-10-CM | POA: Diagnosis not present

## 2022-07-13 DIAGNOSIS — E785 Hyperlipidemia, unspecified: Secondary | ICD-10-CM | POA: Diagnosis not present

## 2022-07-13 LAB — POCT GLYCOSYLATED HEMOGLOBIN (HGB A1C): Hemoglobin A1C: 6.2 % — AB (ref 4.0–5.6)

## 2022-07-13 LAB — POCT UA - MICROALBUMIN
Creatinine, POC: 50 mg/dL
Microalbumin Ur, POC: 80 mg/L

## 2022-07-13 NOTE — Assessment & Plan Note (Signed)
Due to recheck lipids we will go ahead and get fasting labs done today.

## 2022-07-13 NOTE — Progress Notes (Signed)
Established Patient Office Visit  Subjective   Patient ID: Paige Moore, female    DOB: Feb 18, 1946  Age: 77 y.o. MRN: [id number]  Chief Complaint  Patient presents with   Diabetes    HPI  Diabetes - no hypoglycemic events. No wounds or sores that are not healing well. No increased thirst or urination. Checking glucose at home. Taking medications as prescribed without any side effects.  Eye exam is scheduled for July will try to call and get the last eye report.  Follow-up Parkinson's disease-she has been following with Dr. Trena Platt.   They recently added amantadine and she says that is made a huge difference in her freezing and her fall reduction.  Hyperlipidemia - tolerating stating well with no myalgias or significant side effects.  Lab Results  Component Value Date   CHOL 120 06/03/2020   HDL 60 06/03/2020   LDLCALC 44 06/03/2020   LDLDIRECT 112 (H) 10/24/2007   TRIG 82 06/03/2020   CHOLHDL 2.0 06/03/2020        ROS    Objective:     BP 127/75   Pulse 86   Ht 5\' 4"  (1.626 m)   Wt 178 lb (80.7 kg)   SpO2 96%   BMI 30.55 kg/m    Physical Exam Vitals and nursing note reviewed.  Constitutional:      Appearance: She is well-developed.  HENT:     Head: Normocephalic and atraumatic.  Cardiovascular:     Rate and Rhythm: Normal rate and regular rhythm.     Heart sounds: Normal heart sounds.  Pulmonary:     Effort: Pulmonary effort is normal.     Breath sounds: Normal breath sounds.  Skin:    General: Skin is warm and dry.  Neurological:     Mental Status: She is alert and oriented to person, place, and time.  Psychiatric:        Behavior: Behavior normal.      Results for orders placed or performed in visit on 07/13/22  POCT glycosylated hemoglobin (Hb A1C)  Result Value Ref Range   Hemoglobin A1C 6.2 (A) 4.0 - 5.6 %   HbA1c POC (<> result, manual entry)     HbA1c, POC (prediabetic range)     HbA1c, POC (controlled diabetic range)    POCT UA  - Microalbumin  Result Value Ref Range   Microalbumin Ur, POC 80 mg/L   Creatinine, POC 50 mg/dL   Albumin/Creatinine Ratio, Urine, POC 30-300 (A)       The ASCVD Risk score (Arnett DK, et al., 2019) failed to calculate for the following reasons:   The valid total cholesterol range is 130 to 320 mg/dL    Assessment & Plan:   Problem List Items Addressed This Visit       Endocrine   Hyperlipidemia associated with type 2 diabetes mellitus (HCC)    Continue daily statin.      Controlled diabetes mellitus type 2 with complications (HCC) - Primary    A1c looks better today at 6.2.  Great work!.  Continue with metformin.      Relevant Orders   POCT glycosylated hemoglobin (Hb A1C) (Completed)   POCT UA - Microalbumin (Completed)   Lipid Panel w/reflex Direct LDL   COMPLETE METABOLIC PANEL WITH GFR     Nervous and Auditory   Parkinson disease    She has had great improvement with her freezing since starting the amantadine.        Other  Microalbuminuria    Still has some trace albuminuria present.  I have not added an ACE or ARB because her blood pressures tend to run low and with her vascular instability with her Parkinson's disease I am very hesitant to add something that could increase her risk for falls at this point.      Hyperlipidemia    Due to recheck lipids we will go ahead and get fasting labs done today.      Relevant Orders   Lipid Panel w/reflex Direct LDL   COMPLETE METABOLIC PANEL WITH GFR    Return in about 4 months (around 11/12/2022) for Diabetes follow-up.    Nani Gasser, MD

## 2022-07-13 NOTE — Assessment & Plan Note (Signed)
She has had great improvement with her freezing since starting the amantadine.

## 2022-07-13 NOTE — Assessment & Plan Note (Signed)
A1c looks better today at 6.2.  Great work!.  Continue with metformin.

## 2022-07-13 NOTE — Assessment & Plan Note (Addendum)
Still has some trace albuminuria present.  I have not added an ACE or ARB because her blood pressures tend to run low and with her vascular instability with her Parkinson's disease I am very hesitant to add something that could increase her risk for falls at this point.

## 2022-07-13 NOTE — Assessment & Plan Note (Signed)
Continue daily statin. 

## 2022-07-14 LAB — COMPLETE METABOLIC PANEL WITH GFR
AG Ratio: 1.6 (calc) (ref 1.0–2.5)
ALT: 7 U/L (ref 6–29)
AST: 14 U/L (ref 10–35)
Albumin: 4.4 g/dL (ref 3.6–5.1)
Alkaline phosphatase (APISO): 82 U/L (ref 37–153)
BUN: 16 mg/dL (ref 7–25)
CO2: 30 mmol/L (ref 20–32)
Calcium: 9.6 mg/dL (ref 8.6–10.4)
Chloride: 103 mmol/L (ref 98–110)
Creat: 0.84 mg/dL (ref 0.60–1.00)
Globulin: 2.7 g/dL (calc) (ref 1.9–3.7)
Glucose, Bld: 94 mg/dL (ref 65–99)
Potassium: 4.2 mmol/L (ref 3.5–5.3)
Sodium: 141 mmol/L (ref 135–146)
Total Bilirubin: 0.7 mg/dL (ref 0.2–1.2)
Total Protein: 7.1 g/dL (ref 6.1–8.1)
eGFR: 72 mL/min/{1.73_m2} (ref >=60)

## 2022-07-14 LAB — LIPID PANEL W/REFLEX DIRECT LDL
Cholesterol: 114 mg/dL (ref <200)
HDL: 59 mg/dL (ref >=50)
LDL Cholesterol (Calc): 39 mg/dL (calc)
Non-HDL Cholesterol (Calc): 55 mg/dL (calc) (ref <130)
Total CHOL/HDL Ratio: 1.9 (calc) (ref <5.0)
Triglycerides: 77 mg/dL (ref <150)

## 2022-07-14 NOTE — Progress Notes (Signed)
Hi Sheritha, your labs look good overall.

## 2022-08-12 ENCOUNTER — Other Ambulatory Visit: Payer: Self-pay | Admitting: Family Medicine

## 2022-08-12 DIAGNOSIS — E118 Type 2 diabetes mellitus with unspecified complications: Secondary | ICD-10-CM

## 2022-08-12 DIAGNOSIS — E1169 Type 2 diabetes mellitus with other specified complication: Secondary | ICD-10-CM

## 2022-09-23 LAB — HM DIABETES EYE EXAM

## 2022-10-15 ENCOUNTER — Encounter: Payer: Self-pay | Admitting: Family Medicine

## 2022-11-08 ENCOUNTER — Other Ambulatory Visit: Payer: Self-pay | Admitting: Family Medicine

## 2022-11-08 DIAGNOSIS — E118 Type 2 diabetes mellitus with unspecified complications: Secondary | ICD-10-CM

## 2022-11-17 DIAGNOSIS — E118 Type 2 diabetes mellitus with unspecified complications: Secondary | ICD-10-CM | POA: Diagnosis not present

## 2022-11-17 DIAGNOSIS — G20A1 Parkinson's disease without dyskinesia, without mention of fluctuations: Secondary | ICD-10-CM | POA: Diagnosis not present

## 2022-11-18 ENCOUNTER — Ambulatory Visit: Payer: 59 | Admitting: Family Medicine

## 2022-11-21 ENCOUNTER — Other Ambulatory Visit: Payer: Self-pay | Admitting: Family Medicine

## 2022-11-21 DIAGNOSIS — E1169 Type 2 diabetes mellitus with other specified complication: Secondary | ICD-10-CM

## 2022-11-30 ENCOUNTER — Ambulatory Visit: Payer: 59 | Admitting: Family Medicine

## 2022-12-10 ENCOUNTER — Telehealth: Payer: Self-pay | Admitting: Family Medicine

## 2022-12-10 NOTE — Telephone Encounter (Signed)
Sister called in stating that her lift chair has gone out and needs another one. [facility] states that they need another RX for the replacement.  [facility] [phone]

## 2022-12-13 DIAGNOSIS — R6889 Other general symptoms and signs: Secondary | ICD-10-CM | POA: Diagnosis not present

## 2022-12-14 ENCOUNTER — Encounter: Payer: Self-pay | Admitting: Family Medicine

## 2022-12-14 ENCOUNTER — Ambulatory Visit (INDEPENDENT_AMBULATORY_CARE_PROVIDER_SITE_OTHER): Payer: 59 | Admitting: Family Medicine

## 2022-12-14 VITALS — BP 128/71 | HR 82 | Ht 64.0 in | Wt 177.0 lb

## 2022-12-14 DIAGNOSIS — E559 Vitamin D deficiency, unspecified: Secondary | ICD-10-CM | POA: Diagnosis not present

## 2022-12-14 DIAGNOSIS — Z23 Encounter for immunization: Secondary | ICD-10-CM | POA: Diagnosis not present

## 2022-12-14 DIAGNOSIS — E538 Deficiency of other specified B group vitamins: Secondary | ICD-10-CM | POA: Diagnosis not present

## 2022-12-14 DIAGNOSIS — E785 Hyperlipidemia, unspecified: Secondary | ICD-10-CM

## 2022-12-14 DIAGNOSIS — Z7984 Long term (current) use of oral hypoglycemic drugs: Secondary | ICD-10-CM

## 2022-12-14 DIAGNOSIS — R2681 Unsteadiness on feet: Secondary | ICD-10-CM

## 2022-12-14 DIAGNOSIS — E118 Type 2 diabetes mellitus with unspecified complications: Secondary | ICD-10-CM | POA: Diagnosis not present

## 2022-12-14 DIAGNOSIS — G20A1 Parkinson's disease without dyskinesia, without mention of fluctuations: Secondary | ICD-10-CM | POA: Diagnosis not present

## 2022-12-14 LAB — POCT GLYCOSYLATED HEMOGLOBIN (HGB A1C): Hemoglobin A1C: 6 % — AB (ref 4.0–5.6)

## 2022-12-14 NOTE — Assessment & Plan Note (Signed)
Well controlled. Continue current regimen. Follow up in  4-5 months.

## 2022-12-14 NOTE — Assessment & Plan Note (Signed)
She has done well with the adjustment to her medication regimen she does feel like it has been helpful.  Is just having to remember to take it so frequently.  He has not had a lot of freezing since starting the amantadine.

## 2022-12-14 NOTE — Assessment & Plan Note (Signed)
Does use a rolling walker.

## 2022-12-14 NOTE — Assessment & Plan Note (Signed)
Taking her statin daily.  We had just refilled it earlier this month.  Last LDL was down to 39 in April.

## 2022-12-14 NOTE — Progress Notes (Unsigned)
Established Patient Office Visit  Subjective   Patient ID: Paige Moore, female    DOB: November 14, 1945  Age: 78 y.o. MRN: [id number]  Chief Complaint  Patient presents with   Diabetes    HPI  Diabetes - no hypoglycemic events. No wounds or sores that are not healing well. No increased thirst or urination. Checking glucose at home. Taking medications as prescribed without any side effects.  She recently followed up with neurology, Dr. Cardell Peach for her Parkinson disease. She declines PT> Still on amantadine.  Sinemet 25-100  Take 2 pills at 8am, 11am, 2pm, 5pm and 8pm  Sinemet CR 50-200 Take 1 pill at 8am and 1 pill at 8pm      {History (Optional):23778}  ROS    Objective:     BP 128/71   Pulse 82   Ht 5\' 4"  (1.626 m)   Wt 177 lb (80.3 kg)   SpO2 95%   BMI 30.38 kg/m  {Vitals History (Optional):23777}  Physical Exam Vitals and nursing note reviewed.  Constitutional:      Appearance: Normal appearance.  HENT:     Head: Normocephalic and atraumatic.  Eyes:     Conjunctiva/sclera: Conjunctivae normal.  Cardiovascular:     Rate and Rhythm: Normal rate and regular rhythm.  Pulmonary:     Effort: Pulmonary effort is normal.     Breath sounds: Normal breath sounds.  Skin:    General: Skin is warm and dry.  Neurological:     Mental Status: She is alert.  Psychiatric:        Mood and Affect: Mood normal.      Results for orders placed or performed in visit on 12/14/22  POCT HgB A1C  Result Value Ref Range   Hemoglobin A1C 6.0 (A) 4.0 - 5.6 %   HbA1c POC (<> result, manual entry)     HbA1c, POC (prediabetic range)     HbA1c, POC (controlled diabetic range)      {Labs (Optional):23779}  The ASCVD Risk score (Arnett DK, et al., 2019) failed to calculate for the following reasons:   The valid total cholesterol range is 130 to 320 mg/dL    Assessment & Plan:   Problem List Items Addressed This Visit       Endocrine   Controlled diabetes mellitus  type 2 with complications (HCC) - Primary    Well controlled. Continue current regimen. Follow up in  4-5 months       Relevant Orders   POCT HgB A1C (Completed)   CMP14+EGFR     Nervous and Auditory   Parkinson disease (HCC)    She has done well with the adjustment to her medication regimen she does feel like it has been helpful.  Is just having to remember to take it so frequently.  He has not had a lot of freezing since starting the amantadine.        Other   Hyperlipidemia    Taking her statin daily.  We had just refilled it earlier this month.  Last LDL was down to 39 in April.      Gait instability    Does use a rolling walker.      Other Visit Diagnoses     B12 deficiency       Relevant Orders   B12   Vitamin D deficiency       Relevant Orders   VITAMIN D 25 Hydroxy (Vit-D Deficiency, Fractures)       Return  in about 5 months (around 05/02/2023) for Diabetes follow-up.    Nani Gasser, MD

## 2022-12-15 ENCOUNTER — Encounter: Payer: Self-pay | Admitting: Family Medicine

## 2022-12-15 DIAGNOSIS — E559 Vitamin D deficiency, unspecified: Secondary | ICD-10-CM | POA: Insufficient documentation

## 2022-12-15 LAB — CMP14+EGFR
ALT: 7 [IU]/L (ref 0–32)
AST: 16 [IU]/L (ref 0–40)
Albumin: 4.3 g/dL (ref 3.8–4.8)
Alkaline Phosphatase: 84 [IU]/L (ref 44–121)
BUN/Creatinine Ratio: 22 (ref 12–28)
BUN: 15 mg/dL (ref 8–27)
Bilirubin Total: 0.5 mg/dL (ref 0.0–1.2)
CO2: 27 mmol/L (ref 20–29)
Calcium: 9.2 mg/dL (ref 8.7–10.3)
Chloride: 104 mmol/L (ref 96–106)
Creatinine, Ser: 0.69 mg/dL (ref 0.57–1.00)
Globulin, Total: 2.4 g/dL (ref 1.5–4.5)
Glucose: 82 mg/dL (ref 70–99)
Potassium: 4.4 mmol/L (ref 3.5–5.2)
Sodium: 143 mmol/L (ref 134–144)
Total Protein: 6.7 g/dL (ref 6.0–8.5)
eGFR: 89 mL/min/{1.73_m2} (ref >59)

## 2022-12-15 LAB — VITAMIN D 25 HYDROXY (VIT D DEFICIENCY, FRACTURES): Vit D, 25-Hydroxy: 20.2 ng/mL — ABNORMAL LOW (ref 30.0–100.0)

## 2022-12-15 LAB — VITAMIN B12: Vitamin B-12: 333 pg/mL (ref 232–1245)

## 2022-12-15 NOTE — Assessment & Plan Note (Signed)
Due to recheck vitamin D.  

## 2022-12-15 NOTE — Progress Notes (Signed)
Patient: Vitamin D level is low recommend 25 mcg daily over-the-counter if she is already taking vitamin D then she can increase her dose to 50 mcg daily.  Will plan to recheck her level in 3 months.  Metabolic panel looks great.  Vitamin B12 looks great.

## 2022-12-24 NOTE — Telephone Encounter (Signed)
Working on this order thru the parachute portal

## 2023-02-02 ENCOUNTER — Other Ambulatory Visit: Payer: Self-pay | Admitting: Family Medicine

## 2023-02-02 DIAGNOSIS — E118 Type 2 diabetes mellitus with unspecified complications: Secondary | ICD-10-CM

## 2023-02-23 ENCOUNTER — Other Ambulatory Visit: Payer: Self-pay | Admitting: *Deleted

## 2023-02-23 DIAGNOSIS — E118 Type 2 diabetes mellitus with unspecified complications: Secondary | ICD-10-CM

## 2023-02-23 MED ORDER — METFORMIN HCL 500 MG PO TABS: 500.0000 mg | ORAL_TABLET | Freq: Every day | ORAL | 0 refills | Status: DC

## 2023-04-18 ENCOUNTER — Ambulatory Visit: Payer: 59

## 2023-04-18 VITALS — Ht 64.0 in | Wt 175.0 lb

## 2023-04-18 DIAGNOSIS — Z0000 Encounter for general adult medical examination without abnormal findings: Secondary | ICD-10-CM

## 2023-04-18 NOTE — Addendum Note (Signed)
Addended by: Chalmers Cater on: 04/18/2023 11:26 AM   Modules accepted: Level of Service

## 2023-04-18 NOTE — Progress Notes (Signed)
Subjective:   Paige Moore is a 78 y.o. female who presents for Medicare Annual (Subsequent) preventive examination.  Visit Complete: Virtual I connected with  Paige Moore on 04/18/23 by a audio enabled telemedicine application and verified that I am speaking with the correct person using two identifiers.  Patient Location: Home  Provider Location: Office/Clinic  I discussed the limitations of evaluation and management by telemedicine. The patient expressed understanding and agreed to proceed.  Vital Signs: Because this visit was a virtual/telehealth visit, some criteria may be missing or patient reported. Any vitals not documented were not able to be obtained and vitals that have been documented are patient reported.  Patient Medicare AWV questionnaire was completed by the patient on n/a; I have confirmed that all information answered by patient is correct and no changes since this date.  Cardiac Risk Factors include: diabetes mellitus;obesity (BMI >30kg/m2);sedentary lifestyle     Objective:    Today's Vitals   04/18/23 0755  Weight: 175 lb (79.4 kg)  Height: 5\' 4"  (1.626 m)   Body mass index is 30.04 kg/m.     04/18/2023    8:13 AM 04/12/2022    8:10 AM 05/04/2016   10:09 AM 10/11/2013    6:27 AM  Advanced Directives  Does Patient Have a Medical Advance Directive? Yes No Yes Patient has advance directive, copy not in chart  Type of Advance Directive Healthcare Power of [place];Living will  Healthcare Power of [place];Living will Healthcare Power of Attorney  Does patient want to make changes to medical advance directive?   No - Patient declined   Copy of Healthcare Power of Attorney in Chart? Yes - validated most recent copy scanned in chart (See row information)  No - copy requested   Would patient like information on creating a medical advance directive?  No - Patient declined      Current Medications (verified) Outpatient Encounter Medications as of 04/18/2023   Medication Sig   acetaminophen (TYLENOL) 500 MG tablet Take by mouth. 1-2 tabs po tid prn   amantadine (SYMMETREL) 100 MG capsule Take 100 mg by mouth 2 (two) times daily.   atorvastatin (LIPITOR) 40 MG tablet TAKE 1 TABLET BY MOUTH ONCE DAILY NEED  LABS  FOR  REFILLS   Blood Glucose Monitoring Suppl (ONE TOUCH ULTRA SYSTEM KIT) W/DEVICE KIT 1 kit by Does not apply route once.   carbidopa-levodopa (SINEMET CR) 50-200 MG tablet Take 1 tablet by mouth 2 (two) times daily. Appt for refills   carbidopa-levodopa (SINEMET IR) 25-100 MG tablet TAKE 1 TABLET BY MOUTH ONCE DAILY AT NOON. Needs appointment.   Cyanocobalamin (B-12) 500 MCG SUBL Place 500 mcg under the tongue daily.   glucose blood (ONE TOUCH ULTRA TEST) test strip USE 1 STRIP TO CHECK GLUCOSE ONCE DAILY   ibuprofen (ADVIL,MOTRIN) 200 MG tablet Take 200 mg by mouth every 6 (six) hours as needed.   metFORMIN (GLUCOPHAGE) 500 MG tablet Take 1 tablet (500 mg total) by mouth daily with breakfast.   Omega-3 1000 MG CAPS Take by mouth.   ONETOUCH DELICA LANCETS 33G MISC USE 1  TO CHECK GLUCOSE ONCE DAILY   No facility-administered encounter medications on file as of 04/18/2023.    Allergies (verified) Ropinirole hcl   History: Past Medical History:  Diagnosis Date   Essential tremor    Hyperlipidemia    OA (osteoarthritis)    PONV (postoperative nausea and vomiting)    Prediabetes    VIN III (vulvar intraepithelial  neoplasia III)    Wears dentures    upper   Wears glasses    Past Surgical History:  Procedure Laterality Date   CESAREAN SECTION     X 2   Corneal topography  10/09/2018   KNEE ARTHROSCOPY Left    ORIF RIGHT ANKLE FX  1987   later hardware removed   PLANTAR FASCIA RELEASE  12-02-2003   RIGHT HEEL   TOTAL ABDOMINAL HYSTERECTOMY  age 46   TOTAL HIP ARTHROPLASTY Left 7/ 2011    [facility]   TOTAL KNEE ARTHROPLASTY Left 07-11-2010   [facility]   VULVECTOMY Left 10/11/2013   Procedure: WIDE LOCAL EXCISON OF LEFT VULVA;   Surgeon: Adolphus Birchwood, MD;  Location: [facility] [place];  Service: Gynecology;  Laterality: Left;   WRIST GANGLION EXCISION Left 2001   Family History  Problem Relation Age of Onset   Osteoporosis Mother    Hypothyroidism Mother    Diabetes Father    Cancer Sister        endometrial cancer   Social History   Socioeconomic History   Marital status: Widowed    Spouse name: Not on file   Number of children: 2   Years of education: 8   Highest education level: 8th grade  Occupational History   Occupation: [employer]    Comment: retired  Tobacco Use   Smoking status: Former    Current packs/day: 0.00    Average packs/day: 0.3 packs/day for 15.0 years (3.8 ttl pk-yrs)    Types: Cigarettes    Start date: 03/16/1991    Quit date: 03/15/2006    Years since quitting: 17.1   Smokeless tobacco: Never  Vaping Use   Vaping status: Never Used  Substance and Sexual Activity   Alcohol use: No   Drug use: No   Sexual activity: Not Currently  Other Topics Concern   Not on file  Social History Narrative   Retired.Walks. Likes to walk in her neighborhood. Drinks a lot of water daiily   Social Drivers of Health   Financial Resource Strain: Low Risk  (04/18/2023)   Overall Financial Resource Strain (CARDIA)    Difficulty of Paying Living Expenses: Not hard at all  Food Insecurity: No Food Insecurity (04/18/2023)   Hunger Vital Sign    Worried About Running Out of Food in the Last Year: Never true    Ran Out of Food in the Last Year: Never true  Transportation Needs: No Transportation Needs (04/18/2023)   PRAPARE - Administrator, Civil Service (Medical): No    Lack of Transportation (Non-Medical): No  Physical Activity: Insufficiently Active (04/18/2023)   Exercise Vital Sign    Days of Exercise per Week: 4 days    Minutes of Exercise per Session: 10 min  Stress: Stress Concern Present (04/18/2023)   [employer] of Occupational Health - Occupational Stress  Questionnaire    Feeling of Stress : To some extent  Social Connections: Socially Isolated (04/18/2023)   Social Connection and Isolation Panel [NHANES]    Frequency of Communication with Friends and Family: More than three times a week    Frequency of Social Gatherings with Friends and Family: Three times a week    Attends Religious Services: Never    Active Member of Clubs or Organizations: No    Attends Banker Meetings: Never    Marital Status: Widowed    Tobacco Counseling Counseling given: Not Answered   Clinical Intake:  Pre-visit preparation completed:  Yes  Pain : No/denies pain     BMI - recorded: 30.04 Nutritional Status: BMI > 30  Obese Nutritional Risks: None Diabetes: Yes CBG done?: No Did pt. bring in CBG monitor from home?: No  How often do you need to have someone help you when you read instructions, pamphlets, or other written materials from your doctor or pharmacy?: 1 - Never What is the last grade level you completed in school?: 9  Interpreter Needed?: No      Activities of Daily Living    04/18/2023    7:59 AM  In your present state of health, do you have any difficulty performing the following activities:  Hearing? 0  Vision? 0  Difficulty concentrating or making decisions? 1  Walking or climbing stairs? 0  Dressing or bathing? 0  Doing errands, shopping? 0  Preparing Food and eating ? N  Using the Toilet? N  In the past six months, have you accidently leaked urine? N  Do you have problems with loss of bowel control? N  Managing your Medications? N  Managing your Finances? N  Housekeeping or managing your Housekeeping? N    Patient Care Team: Agapito Games, MD as PCP - General (Family Medicine) Carmelina Peal, DO as Referring Physician (Osteopathic Medicine) Val Eagle, DPM as Referring Physician (Podiatry) Langston Masker Everardo All, MD as Referring Physician (Neurology) Jobe Gibbon, [facility])  Indicate any recent Medical Services you may have received from other than [facility] providers in the past year (date may be approximate).     Assessment:   This is a routine wellness examination for [place].  Hearing/Vision screen Hearing Screening - Comments:: Unable to test.  Vision Screening - Comments:: Unable to test   Goals Addressed             This Visit's Progress    Activity and Exercise Increased       She walks up and down the hallway. She wants to increase.       Depression Screen    04/18/2023    8:17 AM 04/18/2023    8:12 AM 07/13/2022    2:33 PM 07/13/2022    2:30 PM 04/12/2022    8:11 AM 03/22/2022    1:20 PM 03/26/2021   11:38 AM  PHQ 2/9 Scores  PHQ - 2 Score 0 0 0 0 0 0 0  PHQ- 9 Score   5        Fall Risk    04/18/2023    8:15 AM 07/13/2022    2:30 PM 04/12/2022    8:11 AM 03/22/2022    1:19 PM 03/26/2021   11:52 AM  Fall Risk   Falls in the past year? 0 0 0 0 0  Number falls in past yr: 0 0 0 0 0  Injury with Fall? 0 0 0 0 0  Risk for fall due to : History of fall(s) Impaired mobility Impaired balance/gait Impaired mobility Impaired mobility  Follow up Falls evaluation completed Falls evaluation completed Falls evaluation completed;Education provided;Falls prevention discussed Falls evaluation completed Falls evaluation completed    MEDICARE RISK AT HOME: Medicare Risk at Home Any stairs in or around the home?: No If so, are there any without handrails?: No Home free of loose throw rugs in walkways, pet beds, electrical cords, etc?: No Adequate lighting in your home to reduce risk of falls?: Yes Life alert?: No Use of a cane, walker or w/c?: Yes  Grab bars in the bathroom?: Yes Shower chair or bench in shower?: No Elevated toilet seat or a handicapped toilet?: Yes  TIMED UP AND GO:  Was the test performed?  No    Cognitive Function:        04/18/2023    8:18 AM 04/12/2022    8:20 AM 05/22/2018   10:22 AM 05/04/2016   10:07 AM   6CIT Screen  What Year? 0 points 0 points  0 points  What month? 0 points 0 points  0 points  What time? 0 points 0 points 0 points 0 points  Count back from 20 0 points 2 points 0 points 0 points  Months in reverse 0 points 0 points 2 points   Repeat phrase 0 points 2 points 0 points 0 points  Total Score 0 points 4 points      Immunizations Immunization History  Administered Date(s) Administered   Fluad Quad(high Dose 65+) 01/03/2019, 01/23/2020, 03/26/2021, 03/22/2022   Fluad Trivalent(High Dose 65+) 12/14/2022   Influenza Split 12/16/2010, 12/27/2011   Influenza Whole 12/27/2005, 01/31/2007, 12/22/2009   Influenza, High Dose Seasonal PF 12/19/2015, 12/09/2016, 01/10/2018   Influenza,inj,Quad PF,6+ Mos 01/08/2013, 12/13/2013, 12/24/2014   PFIZER(Purple Top)SARS-COV-2 Vaccination 06/05/2019, 06/26/2019, 01/23/2020   Pneumococcal Conjugate-13 09/23/2014   Pneumococcal Polysaccharide-23 06/23/2011   Tdap 03/15/2012   Zoster, Live 04/15/2012    TDAP status: Due, Education has been provided regarding the importance of this vaccine. Advised may receive this vaccine at local pharmacy or Health Dept. Aware to provide a copy of the vaccination record if obtained from local pharmacy or Health Dept. Verbalized acceptance and understanding.  Flu Vaccine status: Up to date  Pneumococcal vaccine status: Due, Education has been provided regarding the importance of this vaccine. Advised may receive this vaccine at local pharmacy or Health Dept. Aware to provide a copy of the vaccination record if obtained from local pharmacy or Health Dept. Verbalized acceptance and understanding.  Covid-19 vaccine status: Declined, Education has been provided regarding the importance of this vaccine but patient still declined. Advised may receive this vaccine at local pharmacy or Health Dept.or vaccine clinic. Aware to provide a copy of the vaccination record if obtained from local pharmacy or Health Dept.  Verbalized acceptance and understanding.  Qualifies for Shingles Vaccine? Yes   Zostavax completed No   Shingrix Completed?: No.    Education has been provided regarding the importance of this vaccine. Patient has been advised to call insurance company to determine out of pocket expense if they have not yet received this vaccine. Advised may also receive vaccine at local pharmacy or Health Dept. Verbalized acceptance and understanding.  Screening Tests Health Maintenance  Topic Date Due   Zoster Vaccines- Shingrix (1 of 2) 07/05/1964   DTaP/Tdap/Td (2 - Td or Tdap) 03/15/2022   COVID-19 Vaccine (4 - 2024-25 season) 11/14/2022   HEMOGLOBIN A1C  06/14/2023   Diabetic kidney evaluation - Urine ACR  07/13/2023   FOOT EXAM  07/13/2023   OPHTHALMOLOGY EXAM  09/23/2023   Diabetic kidney evaluation - eGFR measurement  12/14/2023   Medicare Annual Wellness (AWV)  04/17/2024   Pneumonia Vaccine 5+ Years old  Completed   INFLUENZA VACCINE  Completed   DEXA SCAN  Completed   Hepatitis C Screening  Completed   HPV VACCINES  Aged Out   Colonoscopy  Discontinued    Health Maintenance  Health Maintenance Due  Topic Date Due   Zoster Vaccines- Shingrix (1 of 2) 07[birth year]05/1964   DTaP/Tdap/Td (  2 - Td or Tdap) 03/15/2022   COVID-19 Vaccine (4 - 2024-25 season) 11/14/2022    Colorectal cancer screening: No longer required.   Mammogram status: No longer required due to age.  Bone Density status: Completed 08/21/2013. Results reflect: Bone density results: NORMAL. Repeat every n/a years.  Lung Cancer Screening: (Low Dose CT Chest recommended if Age 49-80 years, 20 pack-year currently smoking OR have quit w/in 15years.) does not qualify.   Lung Cancer Screening Referral: n/a  Additional Screening:  Hepatitis C Screening: does qualify; Completed 09/23/2014  Vision Screening: Recommended annual ophthalmology exams for early detection of glaucoma and other disorders of the eye. Is the patient up to  date with their annual eye exam?  Yes  Who is the provider or what is the name of the office in which the patient attends annual eye exams? [facility] If pt is not established with a provider, would they like to be referred to a provider to establish care?  N/a  .   Dental Screening: Recommended annual dental exams for proper oral hygiene  Diabetic Foot Exam: Diabetic Foot Exam: Completed 07/13/2022  Community Resource Referral / Chronic Care Management: CRR required this visit?  No   CCM required this visit?  No     Plan:     I have personally reviewed and noted the following in the patient's chart:   Medical and social history Use of alcohol, tobacco or illicit drugs  Current medications and supplements including opioid prescriptions. Patient is not currently taking opioid prescriptions. Functional ability and status Nutritional status Physical activity Advanced directives List of other physicians Hospitalizations, surgeries, and ER visits in previous 12 months Vitals Screenings to include cognitive, depression, and falls Referrals and appointments  In addition, I have reviewed and discussed with patient certain preventive protocols, quality metrics, and best practice recommendations. A written personalized care plan for preventive services as well as general preventive health recommendations were provided to patient.     Esmond Harps, CMA   04/18/2023   After Visit Summary: (Mail) Due to this being a telephonic visit, the after visit summary with patients personalized plan was offered to patient via mail   Nurse Notes:   Paige Moore advised have the T-dap, Pneumonia 20 and the Shingrix vaccine at her local pharmacy.

## 2023-04-18 NOTE — Patient Instructions (Signed)
  Ms. Karras , Thank you for taking time to come for your Medicare Wellness Visit. I appreciate your ongoing commitment to your health goals. Please review the following plan we discussed and let me know if I can assist you in the future.   These are the goals we discussed:  Goals       Activity and Exercise Increased      She walks up and down the hallway. She wants to increase.       Exercise 3x per week (30 min per time) (pt-stated)      She would like to walk for at least 20 minutes for 3-4 times per week.      Patient Stated (pt-stated)      Patient stated that she would like to be able to walk better. She was recently diagnosed with parkinson's        This is a list of the screening recommended for you and due dates:  Health Maintenance  Topic Date Due   Zoster (Shingles) Vaccine (1 of 2) 07/05/1964   DTaP/Tdap/Td vaccine (2 - Td or Tdap) 03/15/2022   COVID-19 Vaccine (4 - 2024-25 season) 11/14/2022   Hemoglobin A1C  06/14/2023   Yearly kidney health urinalysis for diabetes  07/13/2023   Complete foot exam   07/13/2023   Eye exam for diabetics  09/23/2023   Yearly kidney function blood test for diabetes  12/14/2023   Medicare Annual Wellness Visit  04/17/2024   Pneumonia Vaccine  Completed   Flu Shot  Completed   DEXA scan (bone density measurement)  Completed   Hepatitis C Screening  Completed   HPV Vaccine  Aged Out   Colon Cancer Screening  Discontinued

## 2023-04-19 DIAGNOSIS — G6289 Other specified polyneuropathies: Secondary | ICD-10-CM | POA: Diagnosis not present

## 2023-04-19 DIAGNOSIS — M722 Plantar fascial fibromatosis: Secondary | ICD-10-CM | POA: Diagnosis not present

## 2023-05-17 ENCOUNTER — Encounter: Payer: Self-pay | Admitting: Family Medicine

## 2023-05-17 ENCOUNTER — Ambulatory Visit (INDEPENDENT_AMBULATORY_CARE_PROVIDER_SITE_OTHER): Payer: 59 | Admitting: Family Medicine

## 2023-05-17 VITALS — BP 125/84 | HR 80 | Ht 64.0 in | Wt 178.0 lb

## 2023-05-17 DIAGNOSIS — E559 Vitamin D deficiency, unspecified: Secondary | ICD-10-CM

## 2023-05-17 DIAGNOSIS — G20A1 Parkinson's disease without dyskinesia, without mention of fluctuations: Secondary | ICD-10-CM

## 2023-05-17 DIAGNOSIS — E1169 Type 2 diabetes mellitus with other specified complication: Secondary | ICD-10-CM | POA: Diagnosis not present

## 2023-05-17 DIAGNOSIS — E118 Type 2 diabetes mellitus with unspecified complications: Secondary | ICD-10-CM

## 2023-05-17 DIAGNOSIS — E785 Hyperlipidemia, unspecified: Secondary | ICD-10-CM | POA: Diagnosis not present

## 2023-05-17 DIAGNOSIS — Z7984 Long term (current) use of oral hypoglycemic drugs: Secondary | ICD-10-CM | POA: Diagnosis not present

## 2023-05-17 LAB — POCT GLYCOSYLATED HEMOGLOBIN (HGB A1C): Hemoglobin A1C: 5.8 % — AB (ref 4.0–5.6)

## 2023-05-17 NOTE — Assessment & Plan Note (Addendum)
 A1c looks great today at 5.8.  She is really doing a great job continue work on [employer] and regular exercise.  She mostly does not cook anymore so she relies on microwave foods but she is doing well.  When she comes back next time we will get updated lipid panel and urine microalbumin.

## 2023-05-17 NOTE — Assessment & Plan Note (Signed)
 No recent changes to her medication regimen.  She is doing pretty well with everything and has done a really good job in taking her medications in a timely manner.  She actually has a follow-up coming up with neurology soon.  She says the freezing is not as much of a problem as it was previously and no recent falls though she did have a couple of near falls.  She says she is pretty consistent and using her walker her biggest fear is actually falling while showering but she says she takes a lot of precautions.

## 2023-05-17 NOTE — Assessment & Plan Note (Signed)
 She says she is taking vitamin D daily we will plan to recheck levels again this summer.

## 2023-05-17 NOTE — Progress Notes (Signed)
 Established Patient Office Visit  Subjective  Patient ID: Paige Moore, female    DOB: [birth year]  Age: 78 y.o. MRN: [id number]  Chief Complaint  Patient presents with   Diabetes    HPI   Here for 65-month follow-up for diabetes.  Parkinson's is doing okay she said she has some good days and some bad days.  She has been really consistent with her medication and send has an upcoming appointment.     ROS    Objective:     BP 125/84   Pulse 80   Ht 5\' 4"  (1.626 m)   Wt 178 lb (80.7 kg)   SpO2 97%   BMI 30.55 kg/m    Physical Exam Vitals and nursing note reviewed.  Constitutional:      Appearance: Normal appearance.  HENT:     Head: Normocephalic and atraumatic.  Eyes:     Conjunctiva/sclera: Conjunctivae normal.  Cardiovascular:     Rate and Rhythm: Normal rate and regular rhythm.  Pulmonary:     Effort: Pulmonary effort is normal.     Breath sounds: Normal breath sounds.  Skin:    General: Skin is warm and dry.  Neurological:     Mental Status: She is alert.  Psychiatric:        Mood and Affect: Mood normal.      Results for orders placed or performed in visit on 05/17/23  POCT HgB A1C  Result Value Ref Range   Hemoglobin A1C 5.8 (A) 4.0 - 5.6 %   HbA1c POC (<> result, manual entry)     HbA1c, POC (prediabetic range)     HbA1c, POC (controlled diabetic range)        The ASCVD Risk score (Arnett DK, et al., 2019) failed to calculate for the following reasons:   The valid total cholesterol range is 130 to 320 mg/dL    Assessment & Plan:   Problem List Items Addressed This Visit       Endocrine   Hyperlipidemia associated with type 2 diabetes mellitus (HCC)   He also recently started take fish oil and it is a little over 1000 mg and just wanted to make sure that that was okay.  She can definitely continue with the fish oil.      Controlled diabetes mellitus type 2 with complications (HCC) - Primary   A1c looks great today at 5.8.  She is  really doing a great job continue work on [employer] and regular exercise.  She mostly does not cook anymore so she relies on microwave foods but she is doing well.  When she comes back next time we will get updated lipid panel and urine microalbumin.      Relevant Orders   POCT HgB A1C (Completed)   CMP14+EGFR     Nervous and Auditory   Parkinson disease (HCC)   No recent changes to her medication regimen.  She is doing pretty well with everything and has done a really good job in taking her medications in a timely manner.  She actually has a follow-up coming up with neurology soon.  She says the freezing is not as much of a problem as it was previously and no recent falls though she did have a couple of near falls.  She says she is pretty consistent and using her walker her biggest fear is actually falling while showering but she says she takes a lot of precautions.        Other  Vitamin D deficiency   She says she is taking vitamin D daily we will plan to recheck levels again this summer.       Return in about 6 months (around 11/17/2023) for Diabetes follow-up.    Nani Gasser, MD

## 2023-05-17 NOTE — Assessment & Plan Note (Signed)
 He also recently started take fish oil and it is a little over 1000 mg and just wanted to make sure that that was okay.  She can definitely continue with the fish oil.

## 2023-05-18 LAB — CMP14+EGFR
ALT: 9 IU/L (ref 0–32)
AST: 16 IU/L (ref 0–40)
Albumin: 4.3 g/dL (ref 3.8–4.8)
Alkaline Phosphatase: 94 IU/L (ref 44–121)
BUN/Creatinine Ratio: 30 — ABNORMAL HIGH (ref 12–28)
BUN: 18 mg/dL (ref 8–27)
Bilirubin Total: 0.5 mg/dL (ref 0.0–1.2)
CO2: 21 mmol/L (ref 20–29)
Calcium: 9.1 mg/dL (ref 8.7–10.3)
Chloride: 105 mmol/L (ref 96–106)
Creatinine, Ser: 0.61 mg/dL (ref 0.57–1.00)
Globulin, Total: 2.7 g/dL (ref 1.5–4.5)
Glucose: 92 mg/dL (ref 70–99)
Potassium: 4.4 mmol/L (ref 3.5–5.2)
Sodium: 141 mmol/L (ref 134–144)
Total Protein: 7 g/dL (ref 6.0–8.5)
eGFR: 92 mL/min/{1.73_m2} (ref >59)

## 2023-05-18 NOTE — Progress Notes (Signed)
 Your lab work is within acceptable range and there are no concerning findings.   ?

## 2023-05-19 DIAGNOSIS — E118 Type 2 diabetes mellitus with unspecified complications: Secondary | ICD-10-CM | POA: Diagnosis not present

## 2023-05-19 DIAGNOSIS — G20A1 Parkinson's disease without dyskinesia, without mention of fluctuations: Secondary | ICD-10-CM | POA: Diagnosis not present

## 2023-06-13 ENCOUNTER — Ambulatory Visit: Payer: Self-pay

## 2023-06-13 NOTE — Telephone Encounter (Signed)
 Chief Complaint: burning with urination Symptoms: burning with urination and pink discharge Frequency: x 3 weeks Pertinent Negatives: Patient denies fever Disposition: [] ED /[] Urgent Care (no appt availability in office) / [x] Appointment(In office/virtual)/ []  [facility]/ [] Home Care/ [] Refused Recommended Disposition /[] [facility]/ []  Follow-up with PCP Additional Notes: Pt states that for the past 2 weeks she has been having pink discharge when she wipes and burning with urination. States not every times but some times. Denies any episodes today but states yesterday she had 5 episodes of burning with urination. Patient gave me permission to call her sister to schedule appt time.    Copied From CRM [id number]. Reason for Triage:  Sister Bevelyn Buckles calling said pt Mungo is having a burning sensation when urinating, pink discharge when she wipes, (2 days)     Reason for Disposition . Age > 50 years  Answer Assessment - Initial Assessment Questions 1. SEVERITY: "How bad is the pain?"  (e.g., Scale 1-10; mild, moderate, or severe)   - MILD (1-3): complains slightly about urination hurting   - MODERATE (4-7): interferes with normal activities     - SEVERE (8-10): excruciating, unwilling or unable to urinate because of the pain      0 2. FREQUENCY: "How many times have you had painful urination today?"      0 3. PATTERN: "Is pain present every time you urinate or just sometimes?"      Just sometimes 4. ONSET: "When did the painful urination start?"      X 3 weeks 5. FEVER: "Do you have a fever?" If Yes, ask: "What is your temperature, how was it measured, and when did it start?"     None 6. PAST UTI: "Have you had a urine infection before?" If Yes, ask: "When was the last time?" and "What happened that time?"      no 7. CAUSE: "What do you think is causing the painful urination?"  (e.g., UTI, scratch, Herpes sore)     uti 8. OTHER SYMPTOMS: "Do you have any other  symptoms?" (e.g., blood in urine, flank pain, genital sores, urgency, vaginal discharge)    Light pink discharge when whipping,  Protocols used: Urination Pain - Female-A-AH

## 2023-06-14 ENCOUNTER — Encounter: Payer: Self-pay | Admitting: Family Medicine

## 2023-06-14 ENCOUNTER — Ambulatory Visit (INDEPENDENT_AMBULATORY_CARE_PROVIDER_SITE_OTHER): Admitting: Family Medicine

## 2023-06-14 VITALS — BP 112/72 | HR 82 | Wt 175.1 lb

## 2023-06-14 DIAGNOSIS — N3001 Acute cystitis with hematuria: Secondary | ICD-10-CM | POA: Diagnosis not present

## 2023-06-14 DIAGNOSIS — R300 Dysuria: Secondary | ICD-10-CM

## 2023-06-14 LAB — POCT URINALYSIS DIP (CLINITEK)
Bilirubin, UA: NEGATIVE
Blood, UA: NEGATIVE
Glucose, UA: NEGATIVE mg/dL
Nitrite, UA: NEGATIVE
Spec Grav, UA: >=1.03 — AB (ref 1.010–1.025)
Urobilinogen, UA: 1 U/dL
pH, UA: 5.5 (ref 5.0–8.0)

## 2023-06-14 MED ORDER — NITROFURANTOIN MONOHYD MACRO 100 MG PO CAPS: 100.0000 mg | ORAL_CAPSULE | Freq: Two times a day (BID) | ORAL | 0 refills | Status: DC

## 2023-06-14 NOTE — Progress Notes (Signed)
   Acute Office Visit  Subjective:     Patient ID: Paige Moore, female    DOB: 1945/12/21, 78 y.o.   MRN: [id number]  Chief Complaint  Patient presents with   Abdominal Pain    HPI Patient is in today for a little bit of blood when she wipes for about the last week or so.  No burning with urination no fevers or chills.  She says that it is not in her underwear it is just if she wipes.  Sometimes it is looked a little darker than other times.  Not sure if the blood is more vaginal or if it is coming from the urethra as she has not had any blood after bowel movements.  ROS      Objective:    BP 112/72 (BP Location: Left Arm, Patient Position: Sitting, Cuff Size: Normal)   Pulse 82   Wt 175 lb 1.9 oz (79.4 kg)   SpO2 95%   BMI 30.06 kg/m    Physical Exam Vitals reviewed. Exam conducted with a chaperone present.  Constitutional:      Appearance: Normal appearance.  HENT:     Head: Normocephalic.  Pulmonary:     Effort: Pulmonary effort is normal.  Genitourinary:    Pubic Area: No rash.      Labia:        Right: No rash.        Left: No rash.      Urethra: No urethral swelling.     Comments: External vaginal area appears to be normal no abrasions lesions erythema or rash. Neurological:     Mental Status: She is alert and oriented to person, place, and time.  Psychiatric:        Mood and Affect: Mood normal.        Behavior: Behavior normal.     Results for orders placed or performed in visit on 06/14/23  POCT URINALYSIS DIP (CLINITEK)  Result Value Ref Range   Color, UA yellow yellow   Clarity, UA cloudy (A) clear   Glucose, UA negative negative mg/dL   Bilirubin, UA negative negative   Ketones, POC UA small (15) (A) negative mg/dL   Spec Grav, UA >=0.865 (A) 1.010 - 1.025   Blood, UA negative negative   pH, UA 5.5 5.0 - 8.0   POC PROTEIN,UA trace negative, trace   Urobilinogen, UA 1.0 0.2 or 1.0 E.U./dL   Nitrite, UA Negative Negative   Leukocytes, UA  Small (1+) (A) Negative        Assessment & Plan:   Problem List Items Addressed This Visit   None Visit Diagnoses       Dysuria    -  Primary   Relevant Orders   POCT URINALYSIS DIP (CLINITEK) (Completed)   Urine Culture   Urinalysis, Routine w reflex microscopic   WET PREP FOR TRICH, YEAST, CLUE     Acute cystitis with hematuria          Will tx with Macrobid. Wet prep sent as well.  Call if not improving. Call if inc bleeding/. No active source of bleeding.   Meds ordered this encounter  Medications   nitrofurantoin, macrocrystal-monohydrate, (MACROBID) 100 MG capsule    Sig: Take 1 capsule (100 mg total) by mouth 2 (two) times daily.    Dispense:  10 capsule    Refill:  0    No follow-ups on file.  Nani Gasser, MD

## 2023-06-15 LAB — URINALYSIS, ROUTINE W REFLEX MICROSCOPIC
Bilirubin, UA: NEGATIVE
Glucose, UA: NEGATIVE
Nitrite, UA: NEGATIVE
Protein,UA: NEGATIVE
RBC, UA: NEGATIVE
Specific Gravity, UA: 1.024 (ref 1.005–1.030)
Urobilinogen, Ur: 1 mg/dL (ref 0.2–1.0)
pH, UA: 5 (ref 5.0–7.5)

## 2023-06-15 LAB — SPECIMEN STATUS REPORT

## 2023-06-15 LAB — MICROSCOPIC EXAMINATION
Casts: NONE SEEN /LPF
Epithelial Cells (non renal): NONE SEEN /HPF (ref 0–10)
WBC, UA: >30 /HPF — AB (ref 0–5)

## 2023-06-15 LAB — WET PREP FOR TRICH, YEAST, CLUE
Clue Cell Exam: NEGATIVE
Trichomonas Exam: NEGATIVE
Yeast Exam: NEGATIVE

## 2023-06-17 NOTE — Progress Notes (Signed)
 Call patient: Vaginal swab is normal no sign of overgrowth of bacteria or yeast.  It looks like she may have a UTI.  I am waiting for the culture to come back so I know what antibiotic to put her on.

## 2023-06-18 LAB — SPECIMEN STATUS REPORT

## 2023-06-18 LAB — URINE CULTURE

## 2023-06-20 NOTE — Progress Notes (Signed)
 Call patient: Urine culture came back positive for E. coli.  The antibiotic that we put you on should be working well.  But just let us know if you are not feeling better this week.

## 2023-06-21 ENCOUNTER — Ambulatory Visit: Payer: Self-pay | Admitting: Family Medicine

## 2023-06-21 NOTE — Telephone Encounter (Signed)
 Additional Notes: Pt was seen in office on 4/01 by Dr. Linford Arnold and was prescribed nitrofurantoin. Pt finished this antibiotic. Pt states she spoke on the phone with someone yesterday who stated Dr. Linford Arnold was calling in another antibiotic which pt states is not at her pharmacy. This RN reviewed notes and saw the following message from Dr. Linford Arnold ON 4/7:  "Call patient: Urine culture came back positive for E. coli.  The antibiotic that we put you on should be working well.  But just let us know if you are not feeling better this week."  Pt still states she was told she would be getting another antibiotic. Pt requests call back from office with clarification.    Copied from CRM [id number]. Topic: Clinical - Medication Question >> Jun 21, 2023  4:42 PM Alvino Blood C wrote: Reason for CRM: Patient states she was diagnosed with a UTI and the provider called in a antibiotic but she has ran out and is asking if something else could be called in. Patient's call back # is [phone] Reason for Disposition  [1] Caller requesting NON-URGENT health information AND [2] PCP's office is the best resource  Answer Assessment - Initial Assessment Questions 1. REASON FOR CALL or QUESTION: "What is your reason for calling today?" or "How can I best help you?" or "What question do you have that I can help answer?"     Antibiotic  Protocols used: Information Only Call - No Triage-A-AH

## 2023-06-22 NOTE — Telephone Encounter (Signed)
 Per patient, no longer having burning and abdominal pain. Her symptoms have cleared up.Patient informed no new antibiotic is needed at this time. Patient verbalized understanding. She is aware to contact the office should her symptoms return.

## 2023-06-27 ENCOUNTER — Other Ambulatory Visit: Payer: Self-pay

## 2023-06-27 ENCOUNTER — Ambulatory Visit: Payer: Self-pay

## 2023-06-27 DIAGNOSIS — R319 Hematuria, unspecified: Secondary | ICD-10-CM

## 2023-06-27 NOTE — Telephone Encounter (Signed)
  Chief Complaint: Blood in urine - pain with urination, chills Symptoms: above Frequency: ongoing - had resolved, restarted on Friday Pertinent Negatives: Patient denies fever Disposition: [] ED /[] Urgent Care (no appt availability in office) / [] Appointment(In office/virtual)/ []  [place] [facility]/ [] Home Care/ [] Refused Recommended Disposition /[] [place] [facility]/ [x]  Follow-up with PCP Additional Notes: Spoke with pt's sister Mendel Stain. Pt was being treated fro UTI. S/s had resolved. Pt had blood in urine starting on Friday.  Pt has mild pain with urination, and chills.  D/t difficulty with mobility, sister would like additional ABX called in for pt. Please advise.   Copied from CRM [id number]. Topic: Clinical - Red Word Triage >> Jun 27, 2023  8:14 AM Madelyne Schiff wrote: [facility] that prompted transfer to Nurse Triage: Patients Sister Larrie Po is calling for pt [place]. Patient  is seeing blood in her urine and has a burning sensation.   Just recently finished taking UTI meds. Reason for Disposition  Blood in urine  (Exception: Could be normal menstrual bleeding.)  Answer Assessment - Initial Assessment Questions 1. COLOR of URINE: "Describe the color of the urine."  (e.g., tea-colored, pink, red, bloody) "Do you have blood clots in your urine?" (e.g., none, pea, grape, small coin)     Blood in urine since Friday 2. ONSET: "When did the bleeding start?"      Friday 3. EPISODES: "How many times has there been blood in the urine?" or "How many times today?"     Each day 4. PAIN with URINATION: "Is there any pain with passing your urine?" If Yes, ask: "How bad is the pain?"  (Scale 1-10; or mild, moderate, severe)    - MILD: Complains slightly about urination hurting.    - MODERATE: Interferes with normal activities.      - SEVERE: Excruciating, unwilling or unable to urinate because of the pain.      Mild -moderate 5. FEVER: "Do you have a fever?" If Yes, ask: "What is your  temperature, how was it measured, and when did it start?"     Chills 6. ASSOCIATED SYMPTOMS: "Are you passing urine more frequently than usual?"     no 7. OTHER SYMPTOMS: "Do you have any other symptoms?" (e.g., back/flank pain, abdomen pain, vomiting)     Some pain with urination  Protocols used: Urine - Blood In-A-AH

## 2023-06-27 NOTE — Telephone Encounter (Signed)
 Spoke with patient daughter - informed of need for repeat UA and Urine culture  She will bring her mother in tomorrow for lab visit only visit.

## 2023-06-27 NOTE — Telephone Encounter (Signed)
 She still having recurrent symptoms then she needs a repeat urine culture and UA.  Okay to place on nurse schedule.

## 2023-06-28 ENCOUNTER — Ambulatory Visit (INDEPENDENT_AMBULATORY_CARE_PROVIDER_SITE_OTHER): Admitting: Family Medicine

## 2023-06-28 ENCOUNTER — Encounter: Payer: Self-pay | Admitting: Family Medicine

## 2023-06-28 DIAGNOSIS — R300 Dysuria: Secondary | ICD-10-CM | POA: Diagnosis not present

## 2023-06-28 DIAGNOSIS — R319 Hematuria, unspecified: Secondary | ICD-10-CM | POA: Diagnosis not present

## 2023-06-28 LAB — POCT URINALYSIS DIP (CLINITEK)
Bilirubin, UA: NEGATIVE
Glucose, UA: NEGATIVE mg/dL
Nitrite, UA: NEGATIVE
Spec Grav, UA: >=1.03 — AB (ref 1.010–1.025)
Urobilinogen, UA: 1 U/dL
pH, UA: 5.5 (ref 5.0–8.0)

## 2023-06-28 NOTE — Progress Notes (Signed)
 Sent for cultulre

## 2023-06-28 NOTE — Addendum Note (Signed)
 Addended by: Cassidy Tabet D on: 06/28/2023 02:32 PM   Modules accepted: Level of Service

## 2023-06-28 NOTE — Progress Notes (Signed)
 Pt came in to have her urine rechecked after completing ABX.

## 2023-06-30 LAB — URINE CULTURE

## 2023-06-30 NOTE — Progress Notes (Signed)
 [facility] Quality Team Note  Name: Paige Moore Date of Birth: 20-Mar-1945 MRN: [id number] Date: 06/30/2023  [facility] Quality Team has reviewed this patient's chart, please see recommendations below:  [facility] Quality Other; (Patient needs urine microalbumin/creatinine ratio completed at upcoming office visit with pcp 11/15/2023)

## 2023-06-30 NOTE — Progress Notes (Signed)
 Call patient: Urine culture came back negative send no sign of urinary tract infection so it looks like the previous UTI is completely clear.  Not sure if just having maybe a little irritation from the prior 1 I did really try to work on just increasing fluids and water to really count of get the bladder and urethra sort of cleaned out.  If she still continues to have discomfort then neck step would be to get her in with urology.

## 2023-07-19 DIAGNOSIS — E1142 Type 2 diabetes mellitus with diabetic polyneuropathy: Secondary | ICD-10-CM | POA: Diagnosis not present

## 2023-07-19 DIAGNOSIS — R202 Paresthesia of skin: Secondary | ICD-10-CM | POA: Diagnosis not present

## 2023-07-19 DIAGNOSIS — M7741 Metatarsalgia, right foot: Secondary | ICD-10-CM | POA: Diagnosis not present

## 2023-07-19 DIAGNOSIS — M7742 Metatarsalgia, left foot: Secondary | ICD-10-CM | POA: Diagnosis not present

## 2023-08-05 ENCOUNTER — Other Ambulatory Visit: Payer: Self-pay | Admitting: Family Medicine

## 2023-08-05 DIAGNOSIS — E118 Type 2 diabetes mellitus with unspecified complications: Secondary | ICD-10-CM

## 2023-09-21 ENCOUNTER — Ambulatory Visit
Admission: RE | Admit: 2023-09-21 | Discharge: 2023-09-21 | Disposition: A | Discharge status: Home / Self Care | Attending: Family Medicine | Admitting: Family Medicine

## 2023-09-21 ENCOUNTER — Ambulatory Visit: Payer: Self-pay

## 2023-09-21 VITALS — BP 132/86 | HR 99 | Temp 97.5°F | Resp 18

## 2023-09-21 DIAGNOSIS — N3001 Acute cystitis with hematuria: Secondary | ICD-10-CM

## 2023-09-21 LAB — POCT URINALYSIS DIP (MANUAL ENTRY)
Bilirubin, UA: NEGATIVE
Glucose, UA: NEGATIVE mg/dL
Ketones, POC UA: NEGATIVE mg/dL
Nitrite, UA: NEGATIVE
Spec Grav, UA: <=1.005 — AB (ref 1.010–1.025)
Urobilinogen, UA: 1 U/dL
pH, UA: 6 (ref 5.0–8.0)

## 2023-09-21 MED ORDER — NITROFURANTOIN MONOHYD MACRO 100 MG PO CAPS: 100.0000 mg | ORAL_CAPSULE | Freq: Two times a day (BID) | ORAL | 0 refills | Status: DC

## 2023-09-21 NOTE — Telephone Encounter (Signed)
 FYI Only or Action Required?: FYI only for provider.  Patient was last seen in primary care on 06/28/2023 by Alvan Dorothyann BIRCH, MD.  Called Nurse Triage reporting Hematuria.  Symptoms began today.  Interventions attempted: Nothing.  Symptoms are: blood in urine, urinary frequency  stable.  Triage Disposition: See Physician Within 24 Hours- going to urgent care  Patient/caregiver understands and will follow disposition?: Yes        Patient/caregiver understands and will follow disposition?:  1. COLOR of URINE: Describe the color of the urine. (e.g., tea-colored, pink, red, bloody) Do you have blood clots in your urine? (e.g., none, pea, grape, small coin) Red urine and no blood clots. Second time, lighter pink/red.  2. ONSET: When did the bleeding start?  Today.  3. EPISODES: How many times has there been blood in the urine? or How many times today? Twice today with an episode in between with no blood.  4. PAIN with URINATION: Is there any pain with passing your urine? If Yes, ask: How bad is the pain? (Scale 1-10; or mild, moderate, severe) - MILD: Complains slightly about urination hurting. - MODERATE: Interferes with normal activities.  - SEVERE: Excruciating, unwilling or unable to urinate because of the pain.  No.  5. FEVER: Do you have a fever? If Yes, ask: What is your temperature, how was it measured, and when did it start? No.  6. ASSOCIATED SYMPTOMS: Are you passing urine more frequently than usual? Yes.  7. OTHER SYMPTOMS: Do you have any other symptoms? (e.g., back/flank pain, abdomen pain, vomiting) Lightheaded. Patient denies back/flank pain, abdominal pain, nausea, vomiting.  8. PREGNANCY: Is there any chance you are pregnant? When was your last menstrual period?  N/A.      Copied from CRM [id number]. Topic: Clinical - Red Word Triage >> Sep 21, 2023 12:58 PM Miquel SAILOR wrote: Red Word that prompted transfer to Nurse Triage:  Patient Started bleeding going to restroom couple hours ago Reason for Disposition  Blood in urine  (Exception: Could be normal menstrual bleeding.)  Protocols used: Urine - Blood In-A-AH

## 2023-09-21 NOTE — Discharge Instructions (Signed)
 Take the antibiotic 2 x a day The urine has been sent to the lab for culture You will be called if any change in treatment is needed

## 2023-09-21 NOTE — ED Triage Notes (Signed)
 Patient's sister states that patient went to the bathroom this morning and noticed blood in her urine.  Went back to the bathroom shortly upon arriving here and there was more blood.  Denies any dysuria.

## 2023-09-21 NOTE — ED Provider Notes (Signed)
 [facility]    CSN: [id number] Arrival date & time: 09/21/23  1341      History   Chief Complaint Chief Complaint  Patient presents with   Hematuria    HPI Paige Moore is a 78 y.o. female.   Patient had a urinary tract infection in April.  She was treated by her primary care doctor.  She had hematuria couple weeks later and this urinalysis did not reveal infection.  She was under the care of Dr. Alvan.  She has Parkinson's disease.  She is here today because she has a recurrence of her urinary symptoms.  She has urinary frequency and today has hematuria.  Has not had dysuria.  No fever or chills.  No nausea or vomiting.  No flank or kidney pain.  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   Gait instability 09/07/2018   Parkinson disease (HCC) 09/06/2017   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   Family history of thyroid  disease in mother 12/09/2016   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   Unilateral primary osteoarthritis, right knee 06/16/2010   Anemia due to blood loss 06/16/2010   Low back pain radiating to right leg 06/06/2009   Hyperlipidemia associated with type 2 diabetes mellitus (HCC) 09/12/2006   Hyperlipidemia 12/21/2005   OBESITY, NOS 12/21/2005   MENOPAUSAL SYNDROME 12/21/2005   OSTEOARTHRITIS, LOWER LEG 12/21/2005   INCONTINENCE, URGE 12/21/2005    Past Surgical  History:  Procedure Laterality Date   CESAREAN SECTION     X 2   Corneal topography  10/09/2018   KNEE ARTHROSCOPY Left    ORIF RIGHT ANKLE FX  1987   later hardware removed   PLANTAR FASCIA RELEASE  12-02-2003   RIGHT HEEL   TOTAL ABDOMINAL HYSTERECTOMY  age 52   TOTAL HIP ARTHROPLASTY Left 7/ 2011    [facility]   TOTAL KNEE ARTHROPLASTY Left 07-11-2010   [facility]   VULVECTOMY Left 10/11/2013   Procedure: WIDE LOCAL EXCISON OF LEFT VULVA;  Surgeon: Maurilio Ship, MD;  Location: [facility];  Service: Gynecology;  Laterality: Left;   WRIST GANGLION EXCISION Left 2001    OB History     Gravida  2   Para  2   Term  2   Preterm      AB      Living  2      SAB      IAB      Ectopic      Multiple      Live Births               Home Medications    Prior to Admission medications   Medication Sig Start Date End Date Taking? Authorizing Provider  acetaminophen  (TYLENOL ) 500 MG tablet Take by mouth. 1-2 tabs po tid prn   Yes [provider]  amantadine (SYMMETREL) 100 MG capsule Take 100 mg by mouth 2 (two) times daily. 10/31/21  Yes [provider]  atorvastatin  (LIPITOR) 40 MG tablet TAKE 1 TABLET BY MOUTH ONCE DAILY NEED  LABS  FOR  REFILLS 11/23/22  Yes Alvan Dorothyann BIRCH, MD  Blood Glucose Monitoring Suppl (ONE TOUCH ULTRA SYSTEM KIT) W/DEVICE KIT 1 kit by Does not apply route once. 02/19/14  Yes Alvan Dorothyann BIRCH, MD  carbidopa -levodopa  (SINEMET  CR) 50-200 MG tablet Take 1 tablet by mouth 2 (two) times daily. Appt for refills 10/08/21  Yes Alvan Dorothyann BIRCH, MD  carbidopa -levodopa  (SINEMET  IR) 25-100 MG tablet TAKE 1 TABLET BY MOUTH ONCE DAILY AT NOON. Needs appointment. 11/06/21  Yes Alvan Dorothyann BIRCH, MD  Cyanocobalamin  (B-12) 500 MCG SUBL Place 500 mcg under the tongue daily.   Yes [provider]  glucose blood (ONE TOUCH ULTRA TEST) test strip USE 1 STRIP TO CHECK GLUCOSE ONCE DAILY 12/05/17  Yes Metheney,  Catherine D, MD  ibuprofen (ADVIL,MOTRIN) 200 MG tablet Take 200 mg by mouth every 6 (six) hours as needed.   Yes [provider]  metFORMIN  (GLUCOPHAGE ) 500 MG tablet Take 1 tablet by mouth once daily with breakfast 08/05/23  Yes Alvan Dorothyann BIRCH, MD  nitrofurantoin , macrocrystal-monohydrate, (MACROBID ) 100 MG capsule Take 1 capsule (100 mg total) by mouth 2 (two) times daily. 09/21/23  Yes Maranda Jamee Jacob, MD  Omega-3 1000 MG CAPS Take by mouth.   Yes [provider]  AISHA PASTOR LANCETS 33G MISC USE 1  TO CHECK GLUCOSE ONCE DAILY 12/05/17  Yes Alvan Dorothyann BIRCH, MD    Family History Family History  Problem Relation Age of Onset   Osteoporosis Mother    Hypothyroidism Mother    Diabetes Father    Cancer Sister        endometrial cancer    Social History Social History   Tobacco Use   Smoking status: Former    Current packs/day: 0.00    Average packs/day: 0.3 packs/day for 15.0 years (3.8 ttl pk-yrs)    Types: Cigarettes    Start date: 03/16/1991    Quit date: 03/15/2006    Years since quitting: 17.5   Smokeless tobacco: Never  Vaping Use   Vaping status: Never Used  Substance Use Topics   Alcohol use: No   Drug use: No     Allergies   Ropinirole  hcl   Review of Systems Review of Systems  See HPI Physical Exam Triage Vital Signs ED Triage Vitals  Encounter Vitals Group     BP 09/21/23 1400 132/86     Girls Systolic BP Percentile --      Girls Diastolic BP Percentile --      Boys Systolic BP Percentile --      Boys Diastolic BP Percentile --      Pulse Rate 09/21/23 1400 99     Resp 09/21/23 1400 18     Temp 09/21/23 1400 (!) 97.5 F (36.4 C)     Temp Source 09/21/23 1400 Oral     SpO2 09/21/23 1400 94 %     Weight --      Height --      Head Circumference --      Peak Flow --      Pain Score 09/21/23 1402 0     Pain Loc --      Pain Education --      Exclude from Growth Chart --    No data found.  Updated Vital  Signs BP 132/86 (BP Location: Left Arm)   Pulse 99   Temp (!) 97.5 F (36.4 C) (Oral)   Resp 18   SpO2 94%      Physical Exam Constitutional:      General: She is not in acute distress.    Appearance: She is well-developed and normal weight.  HENT:     Head: Normocephalic and atraumatic.  Eyes:     Conjunctiva/sclera: Conjunctivae normal.     Pupils: Pupils are equal, round, and reactive to light.  Cardiovascular:     Rate and Rhythm: Normal rate.  Pulmonary:     Effort: Pulmonary effort is normal. No respiratory distress.  Abdominal:     General: Abdomen is flat. There is no distension.     Palpations: Abdomen is soft.     Tenderness: There is no abdominal tenderness. There is no right CVA tenderness or left CVA tenderness.  Musculoskeletal:        General: Normal range of motion.     Cervical back: Normal range of motion.  Skin:    General: Skin is warm and dry.  Neurological:     Mental Status: She is alert.      UC Treatments / Results  Labs (all labs ordered are listed, but only abnormal results are displayed) Labs Reviewed  POCT URINALYSIS DIP (MANUAL ENTRY) - Abnormal; Notable for the following components:      Result Value   Spec Grav, UA <=1.005 (*)    Blood, UA large (*)    Protein Ur, POC trace (*)    Leukocytes, UA Small (1+) (*)    All other components within normal limits  URINE CULTURE    EKG   Radiology No results found.  Procedures Procedures (including critical care time)  Medications Ordered in UC Medications - No data to display  Initial Impression / Assessment and Plan / UC Course  I have reviewed the triage vital signs and the nursing notes.  Pertinent labs & imaging results that were available during my care of the patient were reviewed by me and considered in my medical decision making (see chart for details).     I did review her last urine culture.  The results are on the chart.  It was a multiresistant E. coli.   Sensitive to Macrobid .  I will place her on nitrofurantoin , culture the specimen, follow-up with Dr. Alvan Final Clinical Impressions(s) / UC Diagnoses   Final diagnoses:  Acute cystitis with hematuria     Discharge Instructions      Take the antibiotic 2 x a day The urine has been sent to the lab for culture You will be called if any change in treatment is needed   ED Prescriptions     Medication Sig Dispense Auth. Provider   nitrofurantoin , macrocrystal-monohydrate, (MACROBID ) 100 MG capsule Take 1 capsule (100 mg total) by mouth 2 (two) times daily. 10 capsule Maranda Jamee Jacob, MD      PDMP not reviewed this encounter.   Maranda Jamee Jacob, MD 09/21/23 [phone]

## 2023-09-22 LAB — URINE CULTURE: Special Requests: NORMAL

## 2023-09-23 ENCOUNTER — Ambulatory Visit (HOSPITAL_COMMUNITY): Payer: Self-pay

## 2023-09-27 DIAGNOSIS — H26493 Other secondary cataract, bilateral: Secondary | ICD-10-CM | POA: Diagnosis not present

## 2023-09-27 DIAGNOSIS — H02831 Dermatochalasis of right upper eyelid: Secondary | ICD-10-CM | POA: Diagnosis not present

## 2023-09-27 DIAGNOSIS — H02834 Dermatochalasis of left upper eyelid: Secondary | ICD-10-CM | POA: Diagnosis not present

## 2023-09-27 DIAGNOSIS — E119 Type 2 diabetes mellitus without complications: Secondary | ICD-10-CM | POA: Diagnosis not present

## 2023-09-27 DIAGNOSIS — H43813 Vitreous degeneration, bilateral: Secondary | ICD-10-CM | POA: Diagnosis not present

## 2023-09-27 DIAGNOSIS — Z961 Presence of intraocular lens: Secondary | ICD-10-CM | POA: Diagnosis not present

## 2023-09-27 LAB — HM DIABETES EYE EXAM

## 2023-10-06 DIAGNOSIS — G20A1 Parkinson's disease without dyskinesia, without mention of fluctuations: Secondary | ICD-10-CM | POA: Diagnosis not present

## 2023-10-17 ENCOUNTER — Telehealth: Payer: Self-pay

## 2023-10-17 NOTE — Telephone Encounter (Signed)
 [name] states Paige Moore is living alone caring for herself. Vonna does check on her multiple times a week.   She states Drake is getting worse. She is having a hard time bathing and preparing meals. She wanting to know if there are any services she could qualify.

## 2023-10-17 NOTE — Telephone Encounter (Signed)
 Copied from CRM [id number]. Topic: Clinical - Medical Advice >> Oct 17, 2023 10:05 AM Mercer PEDLAR wrote: Reason for CRM: Paige Moore) requesting a callback for medical advice regarding her sister's parkinson's.  Callback: [phone]

## 2023-10-19 NOTE — Telephone Encounter (Signed)
 We can request social work consultation or if she even wants to start with home health coming out and then they can consult social work we could do it either way.

## 2023-10-20 ENCOUNTER — Ambulatory Visit: Payer: Self-pay

## 2023-10-20 DIAGNOSIS — N3001 Acute cystitis with hematuria: Secondary | ICD-10-CM | POA: Diagnosis not present

## 2023-10-20 NOTE — Telephone Encounter (Signed)
 FYI Only or Action Required?: FYI only for provider.  Patient was last seen in primary care on 06/28/2023 by Alvan Dorothyann BIRCH, MD.  Called Nurse Triage reporting Vaginal Bleeding.  Symptoms began several days ago.  Interventions attempted: Nothing.  Symptoms are: rapidly worsening.  Triage Disposition: Go to ED Now (or PCP Triage)  Patient/caregiver understands and will follow disposition?: Yes FYI- during this call patient endorsed feeling lightheaded and just off. She reports diarrhea multiple times a days for a few weeks and feels like her head is woozy. She reports noticing blood when she wipes regardless is she urinates. No appts avail at the office unti 8/11, this RN advises ED to r/o dehydration and low hgb. Patient and sister, [name] agreeable.   Copied from CRM [id number]. Topic: Clinical - Red Word Triage >> Oct 20, 2023  3:55 PM Cherylann RAMAN wrote: Red Word that prompted transfer to Nurse Triage: [name] Cobia, sister, called to report that patient is experiencing heavy bleeding when she goes to the restroom. Patient is used to seeing it when she goes to the restroom but is now seeing it when she wipes as well. She had an episode like this last month. Patient feels as if she is going to pass out/light headed. States that she feels funny per her sister. Reason for Disposition  Patient sounds very sick or weak to the triager  Answer Assessment - Initial Assessment Questions 1. COLOR of URINE: Describe the color of the urine.  (e.g., tea-colored, pink, red, bloody) Do you have blood clots in your urine? (e.g., none, pea, grape, small coin)     Red in color when I urinate and also when I wipe  2. ONSET: When did the bleeding start?      Started today but happened last month and again before in April  3. EPISODES: How many times has there been blood in the urine? or How many times today?     3-4x today  4. PAIN with URINATION: Is there any pain with passing your urine?  If Yes, ask: How bad is the pain?  (Scale 1-10; or mild, moderate, severe)     No reports of pain  5. FEVER: Do you have a fever? If Yes, ask: What is your temperature, how was it measured, and when did it start?     No  6. ASSOCIATED SYMPTOMS: Are you passing urine more frequently than usual?     Yes  7. OTHER SYMPTOMS: Do you have any other symptoms? (e.g., back/flank pain, abdomen pain, vomiting)     Feels woozy, diarrhea a few times a day for the past 2 weeks  8. PREGNANCY: Is there any chance you are pregnant? When was your last menstrual period?     no  Protocols used: Urine - Blood In-A-AH

## 2023-10-21 NOTE — Telephone Encounter (Signed)
 [name] states they will talk to Dr Alvan during her next appointment. She has a lot going on with her bladder at the moment.

## 2023-10-24 ENCOUNTER — Telehealth: Payer: Self-pay

## 2023-10-24 NOTE — Telephone Encounter (Signed)
 Okay to schedule her in an acute slot for this week.  It does not look like they sent a culture so if she still symptomatic we definitely need to get her back in.

## 2023-10-24 NOTE — Telephone Encounter (Signed)
 Copied from CRM #[id number]. Topic: Clinical - Medical Advice >> Oct 24, 2023  8:03 AM Farrel B wrote: Reason for CRM: patient's sister Ms. Mays called stating that the patient has been having some bleeding issues in regards to bladders she stated she was taken to ed as advised by triage nurse, and does have an appoint. The appoint is not scheduled until September 29th and is worried the bleeding will get worse needed to see if provider can call and get the patient in on a quicker appt.

## 2023-10-26 ENCOUNTER — Ambulatory Visit: Admitting: Family Medicine

## 2023-10-26 NOTE — Telephone Encounter (Signed)
 To be addressed .

## 2023-10-27 ENCOUNTER — Ambulatory Visit: Admitting: Family Medicine

## 2023-10-27 DIAGNOSIS — H02831 Dermatochalasis of right upper eyelid: Secondary | ICD-10-CM | POA: Diagnosis not present

## 2023-10-27 DIAGNOSIS — Z961 Presence of intraocular lens: Secondary | ICD-10-CM | POA: Diagnosis not present

## 2023-10-27 DIAGNOSIS — H02834 Dermatochalasis of left upper eyelid: Secondary | ICD-10-CM | POA: Diagnosis not present

## 2023-10-27 DIAGNOSIS — H43813 Vitreous degeneration, bilateral: Secondary | ICD-10-CM | POA: Diagnosis not present

## 2023-10-27 DIAGNOSIS — H26493 Other secondary cataract, bilateral: Secondary | ICD-10-CM | POA: Diagnosis not present

## 2023-10-27 DIAGNOSIS — E119 Type 2 diabetes mellitus without complications: Secondary | ICD-10-CM | POA: Diagnosis not present

## 2023-10-27 DIAGNOSIS — H526 Other disorders of refraction: Secondary | ICD-10-CM | POA: Diagnosis not present

## 2023-11-03 ENCOUNTER — Other Ambulatory Visit: Payer: Self-pay | Admitting: Family Medicine

## 2023-11-03 DIAGNOSIS — E118 Type 2 diabetes mellitus with unspecified complications: Secondary | ICD-10-CM

## 2023-11-04 ENCOUNTER — Other Ambulatory Visit: Payer: Self-pay | Admitting: Family Medicine

## 2023-11-04 DIAGNOSIS — E118 Type 2 diabetes mellitus with unspecified complications: Secondary | ICD-10-CM

## 2023-11-15 ENCOUNTER — Encounter: Payer: Self-pay | Admitting: Family Medicine

## 2023-11-15 ENCOUNTER — Ambulatory Visit (INDEPENDENT_AMBULATORY_CARE_PROVIDER_SITE_OTHER): Admitting: Family Medicine

## 2023-11-15 VITALS — BP 115/86 | HR 89 | Ht 64.0 in | Wt 174.0 lb

## 2023-11-15 DIAGNOSIS — Z23 Encounter for immunization: Secondary | ICD-10-CM

## 2023-11-15 DIAGNOSIS — E118 Type 2 diabetes mellitus with unspecified complications: Secondary | ICD-10-CM | POA: Diagnosis not present

## 2023-11-15 DIAGNOSIS — R110 Nausea: Secondary | ICD-10-CM

## 2023-11-15 DIAGNOSIS — R310 Gross hematuria: Secondary | ICD-10-CM

## 2023-11-15 DIAGNOSIS — G20A1 Parkinson's disease without dyskinesia, without mention of fluctuations: Secondary | ICD-10-CM | POA: Diagnosis not present

## 2023-11-15 DIAGNOSIS — Z7984 Long term (current) use of oral hypoglycemic drugs: Secondary | ICD-10-CM

## 2023-11-15 DIAGNOSIS — H6123 Impacted cerumen, bilateral: Secondary | ICD-10-CM | POA: Diagnosis not present

## 2023-11-15 LAB — POCT GLYCOSYLATED HEMOGLOBIN (HGB A1C): Hemoglobin A1C: 5.8 % — AB (ref 4.0–5.6)

## 2023-11-15 NOTE — Progress Notes (Signed)
 Established Patient Office Visit  Subjective  Patient ID: Paige Moore, female    DOB: 1945/05/18  Age: 78 y.o. MRN: [id number]  Chief Complaint  Patient presents with   Diabetes    HPI  Discussed the use of AI scribe software for clinical note transcription with the patient, who gave verbal consent to proceed.  History of Present Illness Paige Moore is a 78 year old female with Parkinson's disease who presents with recurrent hematuria and urinary frequency.  Hematuria and lower urinary tract symptoms - Recurrent episodes of bright red hematuria occurring every three to four weeks - Last episode of hematuria occurred during emergency department visit on August 7th; no hematuria in the past few days - History of prior urinary tract infections with similar symptoms - Increased urinary frequency noted during recent episode - Urinalysis on August 7th showed cloudy brown urine, blood, >200 red blood cells, and 1+ bacteria; no urine culture performed - Upcoming urology appointment scheduled for October 1st  Fatigue and functional decline - Severe fatigue after minimal activity, such as fixing meals or going to the bathroom - Unable to perform daily activities independently on some days - Lives independently with caregiver support, sister   Gait freezing and motor symptoms - Episodes of 'freezing' with inability to move - Tremors persist despite recent increase in carbidopa -levodopa  dosage  Nausea and gastrointestinal symptoms - Nausea, particularly in the late morning and afternoon - Gagging after a few bites of food, limiting oral intake - No heartburn or abdominal pain after eating - No constipation; regular bowel movements  Antiparkinsonian medication use - Carbidopa -levodopa  taken four times daily, three pills per dose (recently increased from two to three pills per dose) - Additional medications: two red pills during the day and two blue pills at night, totaling 16 pills  daily - No improvement in tremors with increased medication dosage     ROS    Objective:     BP 115/86   Pulse 89   Ht 5' 4 (1.626 m)   Wt 174 lb (78.9 kg)   SpO2 94%   BMI 29.87 kg/m    Physical Exam Vitals and nursing note reviewed.  Constitutional:      Appearance: Normal appearance.  HENT:     Head: Normocephalic and atraumatic.  Eyes:     Conjunctiva/sclera: Conjunctivae normal.  Cardiovascular:     Rate and Rhythm: Normal rate and regular rhythm.  Pulmonary:     Effort: Pulmonary effort is normal.     Breath sounds: Normal breath sounds.  Skin:    General: Skin is warm and dry.  Neurological:     Mental Status: She is alert.  Psychiatric:        Mood and Affect: Mood normal.      Results for orders placed or performed in visit on 11/15/23  POCT HgB A1C  Result Value Ref Range   Hemoglobin A1C 5.8 (A) 4.0 - 5.6 %   HbA1c POC (<> result, manual entry)     HbA1c, POC (prediabetic range)     HbA1c, POC (controlled diabetic range)        The ASCVD Risk score (Arnett DK, et al., 2019) failed to calculate for the following reasons:   The valid total cholesterol range is 130 to 320 mg/dL    Assessment & Plan:   Problem List Items Addressed This Visit       Endocrine   Controlled diabetes mellitus type 2 with complications (HCC) -  Primary   A!c is well controlled. And looks great.        Relevant Orders   POCT HgB A1C (Completed)   Urine Microalbumin w/creat. ratio   Urine Culture     Nervous and Auditory   Parkinson disease (HCC)   Other Visit Diagnoses       Bilateral impacted cerumen         Gross hematuria       Relevant Orders   Urine Microalbumin w/creat. ratio   Urine Culture     Encounter for immunization       Relevant Orders   Flu vaccine HIGH DOSE PF(Fluzone Trivalent) (Completed)     Nausea          Assessment and Plan Assessment & Plan Gross hematuria Gross hematuria with bright red blood during urination.  Previous urinalysis showed blood, protein, and bacteria. Differential includes non-infectious causes like bladder wall irritation or growths. - Collect urine sample for culture. - Initiate antibiotics if culture is positive. - Provide culture results to urologist. - Encourage follow-up with urology on October 1st, possibly including cystoscopy.  Parkinson's disease with impaired mobility and functional decline Parkinson's disease with impaired mobility, functional decline, and freezing episodes. Current carbidopa /levodopa  regimen increased but ineffective for tremors and may cause nausea. - Reduce carbidopa /levodopa  dosage from three to two pills per dose. - Encourage activity on good days. - Monitor symptoms and report to neurologist.  Nausea, likely medication-related Nausea likely related to increased carbidopa /levodopa  dosage, occurring after eating or drinking, especially late morning and afternoon. - Reduce carbidopa /levodopa  dosage. - Monitor nausea symptoms over the next week. - Report changes to neurologist.  Impacted cerumen, bilateral Bilateral impacted cerumen. - Perform ear irrigation.  General Health Maintenance Received Zostavax but not Shingrix. A1c is 5.8. Monofilament exam for diabetic foot screening is due. - Recommend Shingrix vaccine at pharmacy. - Perform monofilament exam for diabetic foot screening.  Nausea - try to dec sinemet  to 2 pills with each dose instead of 3 and see if nausea improved.    Indication: Cerumen impaction of the ear(s) Medical necessity statement: On physical examination, cerumen impairs clinically significant portions of the external auditory canal, and tympanic membrane. Noted obstructive, copious cerumen that cannot be removed without magnification and instrumentations  Consent: Discussed benefits and risks of procedure and verbal consent obtained Procedure: Patient was prepped for the procedure. Utilized an otoscope to assess and take  note of the ear canal, the tympanic membrane, and the presence, amount, and placement of the cerumen. Gentle water irrigation and soft plastic curette was utilized to remove cerumen.  Post procedure examination: shows cerumen was completely removed. Patient tolerated procedure well. The patient is made aware that they may experience temporary vertigo, temporary hearing loss, and temporary discomfort. If these symptom last for more than 24 hours to call the clinic or proceed to the ED.    Return in about 4 months (around 03/16/2024) for Diabetes follow-up.    Dorothyann Byars, MD

## 2023-11-15 NOTE — Assessment & Plan Note (Signed)
 A!c is well controlled. And looks great.

## 2023-11-16 ENCOUNTER — Ambulatory Visit: Payer: Self-pay | Admitting: Family Medicine

## 2023-11-16 LAB — MICROALBUMIN / CREATININE URINE RATIO
Creatinine, Urine: 127.4 mg/dL
Microalb/Creat Ratio: 74 mg/g{creat} — ABNORMAL HIGH (ref 0–29)
Microalbumin, Urine: 93.9 ug/mL

## 2023-11-16 LAB — SPECIMEN STATUS REPORT

## 2023-11-16 NOTE — Progress Notes (Signed)
 Mild protein in urine. Also being eval for UTI.  Will recheck in 6 months. Not on ACE or ARB

## 2023-11-17 DIAGNOSIS — H26492 Other secondary cataract, left eye: Secondary | ICD-10-CM | POA: Diagnosis not present

## 2023-11-17 LAB — URINE CULTURE

## 2023-11-17 NOTE — Progress Notes (Signed)
 Call pt: your urine culture cam back negative.No infection. Keep the urology appt

## 2024-02-02 ENCOUNTER — Other Ambulatory Visit: Payer: Self-pay | Admitting: Family Medicine

## 2024-02-02 DIAGNOSIS — E1169 Type 2 diabetes mellitus with other specified complication: Secondary | ICD-10-CM

## 2024-02-02 DIAGNOSIS — E118 Type 2 diabetes mellitus with unspecified complications: Secondary | ICD-10-CM

## 2024-03-20 ENCOUNTER — Ambulatory Visit (INDEPENDENT_AMBULATORY_CARE_PROVIDER_SITE_OTHER): Admitting: Family Medicine

## 2024-03-20 ENCOUNTER — Encounter: Payer: Self-pay | Admitting: Family Medicine

## 2024-03-20 VITALS — BP 123/78 | HR 76 | Ht 64.0 in | Wt 173.0 lb

## 2024-03-20 DIAGNOSIS — Z7984 Long term (current) use of oral hypoglycemic drugs: Secondary | ICD-10-CM

## 2024-03-20 DIAGNOSIS — G20A1 Parkinson's disease without dyskinesia, without mention of fluctuations: Secondary | ICD-10-CM

## 2024-03-20 DIAGNOSIS — E1129 Type 2 diabetes mellitus with other diabetic kidney complication: Secondary | ICD-10-CM | POA: Diagnosis not present

## 2024-03-20 DIAGNOSIS — R809 Proteinuria, unspecified: Secondary | ICD-10-CM | POA: Diagnosis not present

## 2024-03-20 DIAGNOSIS — E785 Hyperlipidemia, unspecified: Secondary | ICD-10-CM | POA: Diagnosis not present

## 2024-03-20 DIAGNOSIS — E1169 Type 2 diabetes mellitus with other specified complication: Secondary | ICD-10-CM

## 2024-03-20 LAB — POCT GLYCOSYLATED HEMOGLOBIN (HGB A1C): Hemoglobin A1C: 5.9 % — AB (ref 4.0–5.6)

## 2024-03-20 NOTE — Assessment & Plan Note (Signed)
 Not on ACEi/ARB due to low BP

## 2024-03-20 NOTE — Progress Notes (Signed)
 "  Established Patient Office Visit  Patient ID: Paige Moore, female    DOB: [birth year]  Age: 79 y.o. MRN: [id number] PCP: Alvan Dorothyann BIRCH, MD  Chief Complaint  Patient presents with   Diabetes    Subjective:     HPI  Discussed the use of AI scribe software for clinical note transcription with the patient, who gave verbal consent to proceed.  History of Present Illness Paige Moore is a 79 year old female with Parkinson's disease who presents with worsening tremors and medication side effects.  Tremors and motor symptoms - Worsening tremors occurring at variable times each day - Tremors most prominent around noon for 2-3 hours and again in the afternoon - Tremors are severe and interfere with daily activities - Requires naps to manage symptoms - F/U with Neurology is in Feb   Gastrointestinal side effects - Nausea improved after reduction in medication dosage - Currently taking two pills daily - Appetite has returned to normal  Cystitis  - Three diagnostic tests performed, all normal - Emergency room evaluation did not reveal any abnormalities - Bleeding has resolved but remains vigilant for recurrence  Glycemic control - A1c stable, most recent value 5.9 - Continues metformin  therapy - No issues with other medications  Cardiopulmonary symptoms - No chest pain - No shortness of breath  Cognitive changes - Increased forgetfulness - Subjective decline in memory compared to baseline     ROS    Objective:     BP 123/78   Pulse 76   Ht 5' 4 (1.626 m)   Wt 173 lb (78.5 kg)   SpO2 98%   BMI 29.70 kg/m    Physical Exam Vitals and nursing note reviewed.  Constitutional:      Appearance: Normal appearance.  HENT:     Head: Normocephalic and atraumatic.  Eyes:     Conjunctiva/sclera: Conjunctivae normal.  Cardiovascular:     Rate and Rhythm: Normal rate and regular rhythm.  Pulmonary:     Effort: Pulmonary effort is normal.     Breath  sounds: Normal breath sounds.  Skin:    General: Skin is warm and dry.  Neurological:     Mental Status: She is alert.  Psychiatric:        Mood and Affect: Mood normal.      Results for orders placed or performed in visit on 03/20/24  POCT HgB A1C  Result Value Ref Range   Hemoglobin A1C 5.9 (A) 4.0 - 5.6 %   HbA1c POC (<> result, manual entry)     HbA1c, POC (prediabetic range)     HbA1c, POC (controlled diabetic range)        The ASCVD Risk score (Arnett DK, et al., 2019) failed to calculate for the following reasons:   The valid total cholesterol range is 130 to 320 mg/dL    Assessment & Plan:   Problem List Items Addressed This Visit       Endocrine   Hyperlipidemia associated with type 2 diabetes mellitus (HCC) - Primary   Relevant Orders   CMP14+EGFR   Lipid panel   CBC   Diabetes mellitus with microalbuminuria (HCC)   Not on ACEi/ARB due to low BP      Relevant Orders   POCT HgB A1C (Completed)     Nervous and Auditory   Parkinson disease (HCC)   Relevant Orders   CMP14+EGFR   Lipid panel   CBC     Other   Hyperlipidemia  Assessment and Plan Assessment & Plan Parkinson's disease Experiencing tremors with on-off phenomenon due to short-acting medications. Tremors worsen midday. Nausea improved with reduced dosage. - Continue current Parkinson's medication regimen. - Follow up with neurologist in February.  Type 2 diabetes mellitus A1c well-controlled at 5.9. Metformin  effective. - Continue metformin  as prescribed. - Foot exam performed today  Hyperlipidemia No issues with current cholesterol medication. - Continue current cholesterol medication.  General health maintenance Discussed flu prevention measures. - Continue flu prevention measures, including mask-wearing when exposed to sick individuals.   Diabetic Foot Exam - Simple   Simple Foot Form Diabetic Foot exam was performed with the following findings: Yes 03/20/2024  2:04 PM   Visual Inspection No deformities, no ulcerations, no other skin breakdown bilaterally: Yes Sensation Testing Intact to touch and monofilament testing bilaterally: Yes Pulse Check Posterior Tibialis and Dorsalis pulse intact bilaterally: Yes Comments      Return in about 6 months (around 09/17/2024) for Diabetes follow-up, medications .    Dorothyann Byars, MD [facility]   "

## 2024-03-21 ENCOUNTER — Ambulatory Visit: Payer: Self-pay | Admitting: Family Medicine

## 2024-03-21 LAB — CMP14+EGFR
ALT: 5 IU/L (ref 0–32)
AST: 17 IU/L (ref 0–40)
Albumin: 4.4 g/dL (ref 3.8–4.8)
Alkaline Phosphatase: 97 IU/L (ref 49–135)
BUN/Creatinine Ratio: 29 — ABNORMAL HIGH (ref 12–28)
BUN: 22 mg/dL (ref 8–27)
Bilirubin Total: 0.6 mg/dL (ref 0.0–1.2)
CO2: 25 mmol/L (ref 20–29)
Calcium: 9.5 mg/dL (ref 8.7–10.3)
Chloride: 103 mmol/L (ref 96–106)
Creatinine, Ser: 0.77 mg/dL (ref 0.57–1.00)
Globulin, Total: 2.2 g/dL (ref 1.5–4.5)
Glucose: 89 mg/dL (ref 70–99)
Potassium: 4.7 mmol/L (ref 3.5–5.2)
Sodium: 142 mmol/L (ref 134–144)
Total Protein: 6.6 g/dL (ref 6.0–8.5)
eGFR: 79 mL/min/1.73

## 2024-03-21 LAB — LIPID PANEL
Chol/HDL Ratio: 2 ratio (ref 0.0–4.4)
Cholesterol, Total: 110 mg/dL (ref 100–199)
HDL: 54 mg/dL
LDL Chol Calc (NIH): 39 mg/dL (ref 0–99)
Triglycerides: 89 mg/dL (ref 0–149)
VLDL Cholesterol Cal: 17 mg/dL (ref 5–40)

## 2024-03-21 LAB — CBC
Hematocrit: 46.4 % (ref 34.0–46.6)
Hemoglobin: 15.3 g/dL (ref 11.1–15.9)
MCH: 31.4 pg (ref 26.6–33.0)
MCHC: 33 g/dL (ref 31.5–35.7)
MCV: 95 fL (ref 79–97)
Platelets: 176 x10E3/uL (ref 150–450)
RBC: 4.88 x10E6/uL (ref 3.77–5.28)
RDW: 11.8 % (ref 11.7–15.4)
WBC: 7.8 x10E3/uL (ref 3.4–10.8)

## 2024-03-21 NOTE — Progress Notes (Signed)
 Your lab work is within acceptable range and there are no concerning findings.   ?

## 2024-04-18 ENCOUNTER — Ambulatory Visit: Payer: 59

## 2024-04-24 ENCOUNTER — Ambulatory Visit

## 2024-09-18 ENCOUNTER — Ambulatory Visit: Admitting: Family Medicine
